# Patient Record
Sex: Female | Born: 2000 | Race: Black or African American | Hispanic: No | Marital: Single | Smoking: Never smoker
Health system: Southern US, Community
[De-identification: ages and names within clinical notes are randomized; demographics above are authoritative.]

## PROBLEM LIST (undated history)

## (undated) ENCOUNTER — Inpatient Hospital Stay: Payer: Self-pay

## (undated) ENCOUNTER — Inpatient Hospital Stay (HOSPITAL_COMMUNITY): Payer: Self-pay

## (undated) ENCOUNTER — Emergency Department (HOSPITAL_COMMUNITY): Payer: Medicaid Other

## (undated) DIAGNOSIS — E119 Type 2 diabetes mellitus without complications: Secondary | ICD-10-CM

## (undated) DIAGNOSIS — F32A Depression, unspecified: Secondary | ICD-10-CM

## (undated) DIAGNOSIS — F909 Attention-deficit hyperactivity disorder, unspecified type: Secondary | ICD-10-CM

## (undated) DIAGNOSIS — L309 Dermatitis, unspecified: Secondary | ICD-10-CM

## (undated) DIAGNOSIS — F419 Anxiety disorder, unspecified: Secondary | ICD-10-CM

## (undated) DIAGNOSIS — F329 Major depressive disorder, single episode, unspecified: Secondary | ICD-10-CM

## (undated) HISTORY — PX: NO PAST SURGERIES: SHX2092

## (undated) HISTORY — DX: Depression, unspecified: F32.A

## (undated) HISTORY — DX: Dermatitis, unspecified: L30.9

## (undated) HISTORY — PX: UPPER GASTROINTESTINAL ENDOSCOPY: SHX188

## (undated) HISTORY — DX: Anxiety disorder, unspecified: F41.9

## (undated) HISTORY — DX: Major depressive disorder, single episode, unspecified: F32.9

---
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
## 2019-01-23 ENCOUNTER — Encounter: Payer: Medicaid Other | Admitting: Certified Nurse Midwife

## 2019-02-17 ENCOUNTER — Ambulatory Visit (INDEPENDENT_AMBULATORY_CARE_PROVIDER_SITE_OTHER): Payer: Medicaid Other | Admitting: Certified Nurse Midwife

## 2019-02-17 ENCOUNTER — Other Ambulatory Visit: Payer: Self-pay

## 2019-02-17 ENCOUNTER — Encounter: Payer: Self-pay | Admitting: Certified Nurse Midwife

## 2019-02-17 ENCOUNTER — Emergency Department
Admission: EM | Admit: 2019-02-17 | Discharge: 2019-02-17 | Disposition: A | Discharge status: Home / Self Care | Payer: Medicaid Other | Attending: Emergency Medicine | Admitting: Emergency Medicine

## 2019-02-17 VITALS — BP 88/55 | HR 106 | Ht 60.0 in | Wt 219.4 lb

## 2019-02-17 DIAGNOSIS — Z3201 Encounter for pregnancy test, result positive: Secondary | ICD-10-CM

## 2019-02-17 DIAGNOSIS — Z3A00 Weeks of gestation of pregnancy not specified: Secondary | ICD-10-CM | POA: Insufficient documentation

## 2019-02-17 DIAGNOSIS — Z5321 Procedure and treatment not carried out due to patient leaving prior to being seen by health care provider: Secondary | ICD-10-CM | POA: Insufficient documentation

## 2019-02-17 DIAGNOSIS — N926 Irregular menstruation, unspecified: Secondary | ICD-10-CM

## 2019-02-17 LAB — POCT URINE PREGNANCY: Preg Test, Ur: POSITIVE — AB

## 2019-02-17 MED ORDER — DOXYLAMINE-PYRIDOXINE 10-10 MG PO TBEC: 1.0000 | DELAYED_RELEASE_TABLET | Freq: Four times a day (QID) | ORAL | 5 refills | Status: DC

## 2019-02-17 NOTE — ED Notes (Signed)
Unable to locate patient to perform lab draw at this time.

## 2019-02-17 NOTE — Progress Notes (Signed)
Subjective:    Miranda Curtis is a 18 y.o. female who presents for evaluation of amenorrhea. She believes she could be pregnant. Pregnancy is desired. Sexual Activity: single partner, contraception: none. Current symptoms also include: nausea. Last period was normal.   Patient's last menstrual period was 01/11/2019 (exact date). The following portions of the patient's history were reviewed and updated as appropriate: allergies, current medications, past family history, past medical history, past social history, past surgical history and problem list.  Review of Systems Pertinent items are noted in HPI.     Objective:    BP (!) 88/55   Pulse (!) 106   Ht 5' (1.524 m)   Wt 219 lb 6 oz (99.5 kg)   LMP 01/11/2019 (Exact Date)   Breastfeeding No   BMI 42.84 kg/m  General: alert, cooperative, appears stated age, no distress and no acute distress    Lab Review Urine HCG: positive    Assessment:    Absence of menstruation.     Plan:  Positive: EDC: 10/18/19. Briefly discussed pre-natal care options. MD or Midwifery care. Plans midwives. Encouraged well-balanced diet, plenty of rest when needed, pre-natal vitamins daily and walking for exercise. Discussed self-help for nausea, avoiding OTC medications until consulting provider or pharmacist, other than Tylenol as needed, minimal caffeine (1-2 cups daily) and avoiding alcohol. She will schedule u/s for dating in 2 wks, her nurse visit in 5 wks and  her initial NOB visit in 7 wks. Order for Diclegis placed.   Philip Aspen, CNM

## 2019-02-17 NOTE — Patient Instructions (Signed)

## 2019-02-17 NOTE — ED Notes (Signed)
No answer when called to draw blood.
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
## 2019-02-17 NOTE — ED Notes (Signed)
Patient's mother answered when patient's phone was called. Patient left hospital without notifying staff.

## 2019-02-18 ENCOUNTER — Telehealth: Payer: Self-pay | Admitting: Emergency Medicine

## 2019-02-18 NOTE — Telephone Encounter (Signed)
Called patient due to lwot to inquire about condition and follow up plans. Left message.   

## 2019-02-23 ENCOUNTER — Telehealth: Payer: Self-pay

## 2019-02-23 NOTE — Telephone Encounter (Signed)
ED two days ago HCG levels 2951 per pt. Asymptomatic per pt- no breast tenderness nausea or other symptoms any longer. Pt concerned.  [phone].

## 2019-03-03 ENCOUNTER — Other Ambulatory Visit: Payer: Self-pay

## 2019-03-03 ENCOUNTER — Ambulatory Visit (INDEPENDENT_AMBULATORY_CARE_PROVIDER_SITE_OTHER): Payer: Medicaid Other

## 2019-03-03 DIAGNOSIS — N926 Irregular menstruation, unspecified: Secondary | ICD-10-CM

## 2019-03-03 DIAGNOSIS — O3481 Maternal care for other abnormalities of pelvic organs, first trimester: Secondary | ICD-10-CM

## 2019-03-03 DIAGNOSIS — N8312 Corpus luteum cyst of left ovary: Secondary | ICD-10-CM

## 2019-03-03 DIAGNOSIS — Z3A01 Less than 8 weeks gestation of pregnancy: Secondary | ICD-10-CM | POA: Diagnosis not present

## 2019-03-04 ENCOUNTER — Telehealth: Payer: Self-pay

## 2019-03-10 ENCOUNTER — Telehealth: Payer: Self-pay

## 2019-03-10 MED ORDER — ONDANSETRON 4 MG PO TBDP: 4.0000 mg | ORAL_TABLET | Freq: Four times a day (QID) | ORAL | 0 refills | Status: DC | PRN

## 2019-03-10 NOTE — Telephone Encounter (Signed)
Script for zofran sent to preferred pharmacy. Mychart message sent to patient.

## 2019-03-23 ENCOUNTER — Telehealth: Payer: Self-pay

## 2019-03-23 ENCOUNTER — Other Ambulatory Visit: Payer: Self-pay | Admitting: Certified Nurse Midwife

## 2019-03-23 MED ORDER — PROMETHAZINE HCL 12.5 MG PO TABS: 12.5000 mg | ORAL_TABLET | Freq: Four times a day (QID) | ORAL | 0 refills | Status: DC | PRN

## 2019-03-23 NOTE — Telephone Encounter (Signed)
Called pt to prescreen appt 03/25/19. Pt states she has a cold for two days. No fever. No cough or shortness of breath. No contact with positive covid person. Pt states child has a cold as well. Please advise if pt can keep NOB Intake appt.

## 2019-03-23 NOTE — Progress Notes (Signed)
Pt continues to have N&V despite use of diclegis and zofran. Orders placed for phenergan.   Philip Aspen, CNM

## 2019-03-23 NOTE — Telephone Encounter (Signed)
Yes.  Pls reminder her to keep her mask on at all times. She will need to sanitize her hands in and out of the office.  If she develops fever, cough, or shortness of breath before her visit she is to call us. Thanks.

## 2019-03-23 NOTE — Telephone Encounter (Signed)
CMA spoke to pt. No additional follow up needed.

## 2019-03-25 ENCOUNTER — Other Ambulatory Visit: Payer: Self-pay

## 2019-03-25 ENCOUNTER — Ambulatory Visit (INDEPENDENT_AMBULATORY_CARE_PROVIDER_SITE_OTHER): Payer: Medicaid Other | Admitting: Certified Nurse Midwife

## 2019-03-25 VITALS — BP 107/74 | HR 93 | Ht 60.0 in | Wt 212.6 lb

## 2019-03-25 DIAGNOSIS — Z3491 Encounter for supervision of normal pregnancy, unspecified, first trimester: Secondary | ICD-10-CM

## 2019-03-25 DIAGNOSIS — Z113 Encounter for screening for infections with a predominantly sexual mode of transmission: Secondary | ICD-10-CM

## 2019-03-25 DIAGNOSIS — Z1329 Encounter for screening for other suspected endocrine disorder: Secondary | ICD-10-CM

## 2019-03-25 DIAGNOSIS — Z131 Encounter for screening for diabetes mellitus: Secondary | ICD-10-CM

## 2019-03-25 DIAGNOSIS — Z6841 Body Mass Index (BMI) 40.0 and over, adult: Secondary | ICD-10-CM

## 2019-03-25 DIAGNOSIS — Z0283 Encounter for blood-alcohol and blood-drug test: Secondary | ICD-10-CM

## 2019-03-25 NOTE — Patient Instructions (Signed)
First Trimester of Pregnancy °The first trimester of pregnancy is from week 1 until the end of week 13 (months 1 through 3). A week after a sperm fertilizes an egg, the egg will implant on the wall of the uterus. This embryo will begin to develop into a baby. Genes from you and your partner will form the baby. The female genes will determine whether the baby will be a boy or a girl. At 6-8 weeks, the eyes and face will be formed, and the heartbeat can be seen on ultrasound. At the end of 12 weeks, all the baby's organs will be formed. °Now that you are pregnant, you will want to do everything you can to have a healthy baby. Two of the most important things are to get good prenatal care and to follow your health care provider's instructions. Prenatal care is all the medical care you receive before the baby's birth. This care will help prevent, find, and treat any problems during the pregnancy and childbirth. °Body changes during your first trimester °Your body goes through many changes during pregnancy. The changes vary from woman to woman. °· You may gain or lose a couple of pounds at first. °· You may feel sick to your stomach (nauseous) and you may throw up (vomit). If the vomiting is uncontrollable, call your health care provider. °· You may tire easily. °· You may develop headaches that can be relieved by medicines. All medicines should be approved by your health care provider. °· You may urinate more often. Painful urination may mean you have a bladder infection. °· You may develop heartburn as a result of your pregnancy. °· You may develop constipation because certain hormones are causing the muscles that push stool through your intestines to slow down. °· You may develop hemorrhoids or swollen veins (varicose veins). °· Your breasts may begin to grow larger and become tender. Your nipples may stick out more, and the tissue that surrounds them (areola) may become darker. °· Your gums may bleed and may be  sensitive to brushing and flossing. °· Dark spots or blotches (chloasma, mask of pregnancy) may develop on your face. This will likely fade after the baby is born. °· Your menstrual periods will stop. °· You may have a loss of appetite. °· You may develop cravings for certain kinds of food. °· You may have changes in your emotions from day to day, such as being excited to be pregnant or being concerned that something may go wrong with the pregnancy and baby. °· You may have more vivid and strange dreams. °· You may have changes in your hair. These can include thickening of your hair, rapid growth, and changes in texture. Some women also have hair loss during or after pregnancy, or hair that feels dry or thin. Your hair will most likely return to normal after your baby is born. °What to expect at prenatal visits °During a routine prenatal visit: °· You will be weighed to make sure you and the baby are growing normally. °· Your blood pressure will be taken. °· Your abdomen will be measured to track your baby's growth. °· The fetal heartbeat will be listened to between weeks 10 and 14 of your pregnancy. °· Test results from any previous visits will be discussed. °Your health care provider may ask you: °· How you are feeling. °· If you are feeling the baby move. °· If you have had any abnormal symptoms, such as leaking fluid, bleeding, severe headaches, or abdominal   cramping. °· If you are using any tobacco products, including cigarettes, chewing tobacco, and electronic cigarettes. °· If you have any questions. °Other tests that may be performed during your first trimester include: °· Blood tests to find your blood type and to check for the presence of any previous infections. The tests will also be used to check for low iron levels (anemia) and protein on red blood cells (Rh antibodies). Depending on your risk factors, or if you previously had diabetes during pregnancy, you may have tests to check for high blood sugar  that affects pregnant women (gestational diabetes). °· Urine tests to check for infections, diabetes, or protein in the urine. °· An ultrasound to confirm the proper growth and development of the baby. °· Fetal screens for spinal cord problems (spina bifida) and Down syndrome. °· HIV (human immunodeficiency virus) testing. Routine prenatal testing includes screening for HIV, unless you choose not to have this test. °· You may need other tests to make sure you and the baby are doing well. °Follow these instructions at home: °Medicines °· Follow your health care provider's instructions regarding medicine use. Specific medicines may be either safe or unsafe to take during pregnancy. °· Take a prenatal vitamin that contains at least 600 micrograms (mcg) of folic acid. °· If you develop constipation, try taking a stool softener if your health care provider approves. °Eating and drinking ° °· Eat a balanced diet that includes fresh fruits and vegetables, whole grains, good sources of protein such as meat, eggs, or tofu, and low-fat dairy. Your health care provider will help you determine the amount of weight gain that is right for you. °· Avoid raw meat and uncooked cheese. These carry germs that can cause birth defects in the baby. °· Eating four or five small meals rather than three large meals a day may help relieve nausea and vomiting. If you start to feel nauseous, eating a few soda crackers can be helpful. Drinking liquids between meals, instead of during meals, also seems to help ease nausea and vomiting. °· Limit foods that are high in fat and processed sugars, such as fried and sweet foods. °· To prevent constipation: °? Eat foods that are high in fiber, such as fresh fruits and vegetables, whole grains, and beans. °? Drink enough fluid to keep your urine clear or pale yellow. °Activity °· Exercise only as directed by your health care provider. Most women can continue their usual exercise routine during  pregnancy. Try to exercise for 30 minutes at least 5 days a week. Exercising will help you: °? Control your weight. °? Stay in shape. °? Be prepared for labor and delivery. °· Experiencing pain or cramping in the lower abdomen or lower back is a good sign that you should stop exercising. Check with your health care provider before continuing with normal exercises. °· Try to avoid standing for long periods of time. Move your legs often if you must stand in one place for a long time. °· Avoid heavy lifting. °· Wear low-heeled shoes and practice good posture. °· You may continue to have sex unless your health care provider tells you not to. °Relieving pain and discomfort °· Wear a good support bra to relieve breast tenderness. °· Take warm sitz baths to soothe any pain or discomfort caused by hemorrhoids. Use hemorrhoid cream if your health care provider approves. °· Rest with your legs elevated if you have leg cramps or low back pain. °· If you develop varicose veins in   your legs, wear support hose. Elevate your feet for 15 minutes, 3-4 times a day. Limit salt in your diet. °Prenatal care °· Schedule your prenatal visits by the twelfth week of pregnancy. They are usually scheduled monthly at first, then more often in the last 2 months before delivery. °· Write down your questions. Take them to your prenatal visits. °· Keep all your prenatal visits as told by your health care provider. This is important. °Safety °· Wear your seat belt at all times when driving. °· Make a list of emergency phone numbers, including numbers for family, friends, the hospital, and police and fire departments. °General instructions °· Ask your health care provider for a referral to a local prenatal education class. Begin classes no later than the beginning of month 6 of your pregnancy. °· Ask for help if you have counseling or nutritional needs during pregnancy. Your health care provider can offer advice or refer you to specialists for help  with various needs. °· Do not use hot tubs, steam rooms, or saunas. °· Do not douche or use tampons or scented sanitary pads. °· Do not cross your legs for long periods of time. °· Avoid cat litter boxes and soil used by cats. These carry germs that can cause birth defects in the baby and possibly loss of the fetus by miscarriage or stillbirth. °· Avoid all smoking, herbs, alcohol, and medicines not prescribed by your health care provider. Chemicals in these products affect the formation and growth of the baby. °· Do not use any products that contain nicotine or tobacco, such as cigarettes and e-cigarettes. If you need help quitting, ask your health care provider. You may receive counseling support and other resources to help you quit. °· Schedule a dentist appointment. At home, brush your teeth with a soft toothbrush and be gentle when you floss. °Contact a health care provider if: °· You have dizziness. °· You have mild pelvic cramps, pelvic pressure, or nagging pain in the abdominal area. °· You have persistent nausea, vomiting, or diarrhea. °· You have a bad smelling vaginal discharge. °· You have pain when you urinate. °· You notice increased swelling in your face, hands, legs, or ankles. °· You are exposed to fifth disease or chickenpox. °· You are exposed to German measles (rubella) and have never had it. °Get help right away if: °· You have a fever. °· You are leaking fluid from your vagina. °· You have spotting or bleeding from your vagina. °· You have severe abdominal cramping or pain. °· You have rapid weight gain or loss. °· You vomit blood or material that looks like coffee grounds. °· You develop a severe headache. °· You have shortness of breath. °· You have any kind of trauma, such as from a fall or a car accident. °Summary °· The first trimester of pregnancy is from week 1 until the end of week 13 (months 1 through 3). °· Your body goes through many changes during pregnancy. The changes vary from  woman to woman. °· You will have routine prenatal visits. During those visits, your health care provider will examine you, discuss any test results you may have, and talk with you about how you are feeling. °This information is not intended to replace advice given to you by your health care provider. Make sure you discuss any questions you have with your health care provider. °Document Released: 03/20/2001 Document Revised: 03/08/2017 Document Reviewed: 03/07/2016 °Elsevier Patient Education © 2020 [employer] ° °Commonly Asked   Questions During Pregnancy ° °Cats: A parasite can be excreted in cat feces.  To avoid exposure you need to have another person empty the little box.  If you must empty the litter box you will need to wear gloves.  Wash your hands after handling your cat.  This parasite can also be found in raw or undercooked meat so this should also be avoided. ° °Colds, Sore Throats, Flu: Please check your medication sheet to see what you can take for symptoms.  If your symptoms are unrelieved by these medications please call the office. ° °Dental Work: Most any dental work your dentist recommends is permitted.  X-rays should only be taken during the first trimester if absolutely necessary.  Your abdomen should be shielded with a lead apron during all x-rays.  Please notify your provider prior to receiving any x-rays.  Novocaine is fine; gas is not recommended.  If your dentist requires a note from us prior to dental work please call the office and we will provide one for you. ° °Exercise: Exercise is an important part of staying healthy during your pregnancy.  You may continue most exercises you were accustomed to prior to pregnancy.  Later in your pregnancy you will most likely notice you have difficulty with activities requiring balance like riding a bicycle.  It is important that you listen to your body and avoid activities that put you at a higher risk of falling.  Adequate rest and staying well  hydrated are a must!  If you have questions about the safety of specific activities ask your provider.   ° °Exposure to Children with illness: Try to avoid obvious exposure; report any symptoms to us when noted,  If you have chicken pos, red measles or mumps, you should be immune to these diseases.   Please do not take any vaccines while pregnant unless you have checked with your OB provider. ° °Fetal Movement: After 28 weeks we recommend you do "kick counts" twice daily.  Lie or sit down in a calm quiet environment and count your baby movements "kicks".  You should feel your baby at least 10 times per hour.  If you have not felt 10 kicks within the first hour get up, walk around and have something sweet to eat or drink then repeat for an additional hour.  If count remains less than 10 per hour notify your provider. ° °Fumigating: Follow your pest control agent's advice as to how long to stay out of your home.  Ventilate the area well before re-entering. ° °Hemorrhoids:   Most over-the-counter preparations can be used during pregnancy.  Check your medication to see what is safe to use.  It is important to use a stool softener or fiber in your diet and to drink lots of liquids.  If hemorrhoids seem to be getting worse please call the office.  ° °Hot Tubs:  Hot tubs Jacuzzis and saunas are not recommended while pregnant.  These increase your internal body temperature and should be avoided. ° °Intercourse:  Sexual intercourse is safe during pregnancy as long as you are comfortable, unless otherwise advised by your provider.  Spotting may occur after intercourse; report any bright red bleeding that is heavier than spotting. ° °Labor:  If you know that you are in labor, please go to the hospital.  If you are unsure, please call the office and let us help you decide what to do. ° °Lifting, straining, etc:  If your job requires heavy lifting or   straining please check with your provider for any limitations.  Generally, you  should not lift items heavier than that you can lift simply with your hands and arms (no back muscles) ° °Painting:  Paint fumes do not harm your pregnancy, but may make you ill and should be avoided if possible.  Latex or water based paints have less odor than oils.  Use adequate ventilation while painting. ° °Permanents & Hair Color:  Chemicals in hair dyes are not recommended as they cause increase hair dryness which can increase hair loss during pregnancy.  " Highlighting" and permanents are allowed.  Dye may be absorbed differently and permanents may not hold as well during pregnancy. ° °Sunbathing:  Use a sunscreen, as skin burns easily during pregnancy.  Drink plenty of fluids; avoid over heating. ° °Tanning Beds:  Because their possible side effects are still unknown, tanning beds are not recommended. ° °Ultrasound Scans:  Routine ultrasounds are performed at approximately 20 weeks.  You will be able to see your baby's general anatomy an if you would like to know the gender this can usually be determined as well.  If it is questionable when you conceived you may also receive an ultrasound early in your pregnancy for dating purposes.  Otherwise ultrasound exams are not routinely performed unless there is a medical necessity.  Although you can request a scan we ask that you pay for it when conducted because insurance does not cover " patient request" scans. ° °Work: If your pregnancy proceeds without complications you may work until your due date, unless your physician or employer advises otherwise. ° °Round Ligament Pain/Pelvic Discomfort:  Sharp, shooting pains not associated with bleeding are fairly common, usually occurring in the second trimester of pregnancy.  They tend to be worse when standing up or when you remain standing for long periods of time.  These are the result of pressure of certain pelvic ligaments called "round ligaments".  Rest, Tylenol and heat seem to be the most effective relief.  As  the womb and fetus grow, they rise out of the pelvis and the discomfort improves.  Please notify the office if your pain seems different than that described.  It may represent a more serious condition. ° ° °How a Baby Grows During Pregnancy ° °Pregnancy begins when a female's sperm enters a female's egg (fertilization). Fertilization usually happens in one of the tubes (fallopian tubes) that connect the ovaries to the womb (uterus). The fertilized egg moves down the fallopian tube to the uterus. Once it reaches the uterus, it implants into the lining of the uterus and begins to grow. °For the first 10 weeks, the fertilized egg is called an embryo. After 10 weeks, it is called a fetus. As the fetus continues to grow, it receives oxygen and nutrients through tissue (placenta) that grows to support the developing baby. The placenta is the life support system for the baby. It provides oxygen and nutrition and removes waste. °Learning as much as you can about your pregnancy and how your baby is developing can help you enjoy the experience. It can also make you aware of when there might be a problem and when to ask questions. °How long does a typical pregnancy last? °A pregnancy usually lasts 280 days, or about 40 weeks. Pregnancy is divided into three periods of growth, also called trimesters: °· First trimester: 0-12 weeks. °· Second trimester: 13-27 weeks. °· Third trimester: 28-40 weeks. °The day when your baby is ready to be   born (full term) is your estimated date of delivery. °How does my baby develop month by month? °First month °· The fertilized egg attaches to the inside of the uterus. °· Some cells will form the placenta. Others will form the fetus. °· The arms, legs, brain, spinal cord, lungs, and heart begin to develop. °· At the end of the first month, the heart begins to beat. °Second month °· The bones, inner ear, eyelids, hands, and feet form. °· The genitals develop. °· By the end of 8 weeks, all major  organs are developing. °Third month °· All of the internal organs are forming. °· Teeth develop below the gums. °· Bones and muscles begin to grow. The spine can flex. °· The skin is transparent. °· Fingernails and toenails begin to form. °· Arms and legs continue to grow longer, and hands and feet develop. °· The fetus is about 3 inches (7.6 cm) long. °Fourth month °· The placenta is completely formed. °· The external sex organs, neck, outer ear, eyebrows, eyelids, and fingernails are formed. °· The fetus can hear, swallow, and move its arms and legs. °· The kidneys begin to produce urine. °· The skin is covered with a white, waxy coating (vernix) and very fine hair (lanugo). °Fifth month °· The fetus moves around more and can be felt for the first time (quickening). °· The fetus starts to sleep and wake up and may begin to suck its finger. °· The nails grow to the end of the fingers. °· The organ in the digestive system that makes bile (gallbladder) functions and helps to digest nutrients. °· If your baby is a girl, eggs are present in her ovaries. If your baby is a boy, testicles start to move down into his scrotum. °Sixth month °· The lungs are formed. °· The eyes open. The brain continues to develop. °· Your baby has fingerprints and toe prints. Your baby's hair grows thicker. °· At the end of the second trimester, the fetus is about 9 inches (22.9 cm) long. °Seventh month °· The fetus kicks and stretches. °· The eyes are developed enough to sense changes in light. °· The hands can make a grasping motion. °· The fetus responds to sound. °Eighth month °· All organs and body systems are fully developed and functioning. °· Bones harden, and taste buds develop. The fetus may hiccup. °· Certain areas of the brain are still developing. The skull remains soft. °Ninth month °· The fetus gains about ½ lb (0.23 kg) each week. °· The lungs are fully developed. °· Patterns of sleep develop. °· The fetus's head typically  moves into a head-down position (vertex) in the uterus to prepare for birth. °· The fetus weighs 6-9 lb (2.72-4.08 kg) and is 19-20 inches (48.26-50.8 cm) long. °What can I do to have a healthy pregnancy and help my baby develop? °General instructions °· Take prenatal vitamins as directed by your health care provider. These include vitamins such as folic acid, iron, calcium, and vitamin D. They are important for healthy development. °· Take medicines only as directed by your health care provider. Read labels and ask a pharmacist or your health care provider whether over-the-counter medicines, supplements, and prescription drugs are safe to take during pregnancy. °· Keep all follow-up visits as directed by your health care provider. This is important. Follow-up visits include prenatal care and screening tests. °How do I know if my baby is developing well? °At each prenatal visit, your health care provider will do   several different tests to check on your health and keep track of your baby's development. These include: °· Fundal height and position. °? Your health care provider will measure your growing belly from your pubic bone to the top of the uterus using a tape measure. °? Your health care provider will also feel your belly to determine your baby's position. °· Heartbeat. °? An ultrasound in the first trimester can confirm pregnancy and show a heartbeat, depending on how far along you are. °? Your health care provider will check your baby's heart rate at every prenatal visit. °· Second trimester ultrasound. °? This ultrasound checks your baby's development. It also may show your baby's gender. °What should I do if I have concerns about my baby's development? °Always talk with your health care provider about any concerns that you may have about your pregnancy and your baby. °Summary °· A pregnancy usually lasts 280 days, or about 40 weeks. Pregnancy is divided into three periods of growth, also called  trimesters. °· Your health care provider will monitor your baby's growth and development throughout your pregnancy. °· Follow your health care provider's recommendations about taking prenatal vitamins and medicines during your pregnancy. °· Talk with your health care provider if you have any concerns about your pregnancy or your developing baby. °This information is not intended to replace advice given to you by your health care provider. Make sure you discuss any questions you have with your health care provider. °Document Released: 09/12/2007 Document Revised: 07/17/2018 Document Reviewed: 02/06/2017 °Elsevier Patient Education © 2020 [employer] °  °

## 2019-03-25 NOTE — Progress Notes (Signed)
Levon Hedger presents for NOB nurse interview visit.  Pregnancy confirmation done at [facility] with [facility].  G-3.  P-1.  LMP 01/11/2019.  EDD 10/18/2019. Dating scan done 03/03/2019 [facility] [redacted]w[redacted]d.  Pregnancy education material explained and given.  No cats in the home.  NOB labs ordered.  TSH/HgA1C ordered due to increased BMI.  Body mass index is 41.52 kg/m.  Sickle cell lab ordered. HIV and drug screen were explained and ordered.  PNV encouraged.  Genetic screening options discussed.  Genetic testing: Unsure.  FMLA form signed and financial policy reviewed.    Patient getting over a cold.  Patient c/o continued N&V, unable to take PNV.  Patient request Zofran refill, states "Diclegis and Phenergan do not work".  NOB physical scheduled already with [facility] on 04/07/2019.

## 2019-03-26 LAB — URINALYSIS, ROUTINE W REFLEX MICROSCOPIC
Bilirubin, UA: NEGATIVE
Glucose, UA: NEGATIVE
Nitrite, UA: NEGATIVE
RBC, UA: NEGATIVE
Specific Gravity, UA: 1.027 (ref 1.005–1.030)
Urobilinogen, Ur: 1 mg/dL (ref 0.2–1.0)
pH, UA: 7.5 (ref 5.0–7.5)

## 2019-03-26 LAB — HIV ANTIBODY (ROUTINE TESTING W REFLEX): HIV Screen 4th Generation wRfx: NONREACTIVE

## 2019-03-26 LAB — MONITOR DRUG PROFILE 14(MW)
Amphetamine Scrn, Ur: NEGATIVE ng/mL
BARBITURATE SCREEN URINE: NEGATIVE ng/mL
BENZODIAZEPINE SCREEN, URINE: NEGATIVE ng/mL
Buprenorphine, Urine: NEGATIVE ng/mL
CANNABINOIDS UR QL SCN: NEGATIVE ng/mL
Cocaine (Metab) Scrn, Ur: NEGATIVE ng/mL
Creatinine(Crt), U: 287.1 mg/dL (ref 20.0–300.0)
Fentanyl, Urine: NEGATIVE pg/mL
Meperidine Screen, Urine: NEGATIVE ng/mL
Methadone Screen, Urine: NEGATIVE ng/mL
OXYCODONE+OXYMORPHONE UR QL SCN: NEGATIVE ng/mL
Opiate Scrn, Ur: NEGATIVE ng/mL
Ph of Urine: 7.7 (ref 4.5–8.9)
Phencyclidine Qn, Ur: NEGATIVE ng/mL
Propoxyphene Scrn, Ur: NEGATIVE ng/mL
SPECIFIC GRAVITY: 1.018
Tramadol Screen, Urine: NEGATIVE ng/mL

## 2019-03-26 LAB — ABO AND RH: Rh Factor: POSITIVE

## 2019-03-26 LAB — MICROSCOPIC EXAMINATION
Casts: NONE SEEN /lpf
Epithelial Cells (non renal): >10 /hpf — AB (ref 0–10)
RBC, Urine: NONE SEEN /hpf (ref 0–2)

## 2019-03-26 LAB — RUBELLA SCREEN: Rubella Antibodies, IGG: 1.41 index (ref >0.99)

## 2019-03-26 LAB — HEMOGLOBIN A1C
Est. average glucose Bld gHb Est-mCnc: 85 mg/dL
Hgb A1c MFr Bld: 4.6 % — ABNORMAL LOW (ref 4.8–5.6)

## 2019-03-26 LAB — GC/CHLAMYDIA PROBE AMP
Chlamydia trachomatis, NAA: NEGATIVE
Neisseria Gonorrhoeae by PCR: NEGATIVE

## 2019-03-26 LAB — ANTIBODY SCREEN: Antibody Screen: NEGATIVE

## 2019-03-26 LAB — NICOTINE SCREEN, URINE: Cotinine Ql Scrn, Ur: NEGATIVE ng/mL

## 2019-03-26 LAB — VARICELLA ZOSTER ANTIBODY, IGG: Varicella zoster IgG: <135 index — ABNORMAL LOW (ref >165)

## 2019-03-26 LAB — SICKLE CELL SCREEN: Sickle Cell Screen: NEGATIVE

## 2019-03-26 LAB — HEPATITIS B SURFACE ANTIGEN: Hepatitis B Surface Ag: NEGATIVE

## 2019-03-26 LAB — TSH: TSH: 0.529 u[IU]/mL (ref 0.450–4.500)

## 2019-03-26 LAB — RPR: RPR Ser Ql: NONREACTIVE

## 2019-03-27 LAB — URINE CULTURE, OB REFLEX

## 2019-03-27 LAB — CULTURE, OB URINE

## 2019-04-07 ENCOUNTER — Other Ambulatory Visit: Payer: Self-pay

## 2019-04-07 ENCOUNTER — Ambulatory Visit (INDEPENDENT_AMBULATORY_CARE_PROVIDER_SITE_OTHER): Payer: Medicaid Other | Admitting: Certified Nurse Midwife

## 2019-04-07 ENCOUNTER — Other Ambulatory Visit (INDEPENDENT_AMBULATORY_CARE_PROVIDER_SITE_OTHER): Payer: Medicaid Other

## 2019-04-07 ENCOUNTER — Encounter: Payer: Medicaid Other | Admitting: Obstetrics and Gynecology

## 2019-04-07 VITALS — BP 121/84 | HR 100 | Wt 212.2 lb

## 2019-04-07 DIAGNOSIS — Z3A08 8 weeks gestation of pregnancy: Secondary | ICD-10-CM

## 2019-04-07 DIAGNOSIS — N8312 Corpus luteum cyst of left ovary: Secondary | ICD-10-CM

## 2019-04-07 DIAGNOSIS — Z3491 Encounter for supervision of normal pregnancy, unspecified, first trimester: Secondary | ICD-10-CM

## 2019-04-07 DIAGNOSIS — O3481 Maternal care for other abnormalities of pelvic organs, first trimester: Secondary | ICD-10-CM | POA: Diagnosis not present

## 2019-04-07 DIAGNOSIS — Z3A12 12 weeks gestation of pregnancy: Secondary | ICD-10-CM | POA: Diagnosis not present

## 2019-04-07 DIAGNOSIS — O021 Missed abortion: Secondary | ICD-10-CM | POA: Insufficient documentation

## 2019-04-07 HISTORY — DX: Missed abortion: O02.1

## 2019-04-07 LAB — POCT URINALYSIS DIPSTICK OB
Bilirubin, UA: NEGATIVE
Blood, UA: NEGATIVE
Glucose, UA: NEGATIVE
Ketones, UA: NEGATIVE
Leukocytes, UA: NEGATIVE
Nitrite, UA: NEGATIVE
POC,PROTEIN,UA: NEGATIVE
Spec Grav, UA: 1.025 (ref 1.010–1.025)
Urobilinogen, UA: 0.2 E.U./dL
pH, UA: 5 (ref 5.0–8.0)

## 2019-04-07 NOTE — Progress Notes (Signed)
NEW OB HISTORY AND PHYSICAL  SUBJECTIVE:       Miranda Curtis is a 18 y.o. G33P1011 female, Patient's last menstrual period was 01/11/2019 (exact date)., Estimated Date of Delivery: 10/18/19, [redacted]w[redacted]d, presents today for establishment of Prenatal Care. She has no unusual complaints     Body mass index is 41.45 kg/m.  Gynecologic History Patient's last menstrual period was 01/11/2019 (exact date). Normal Contraception: none Last Pap: n/a .   Obstetric History OB History  Gravida Para Term Preterm AB Living  3 1 1   1 1   SAB TAB Ectopic Multiple Live Births  1     0 1    # Outcome Date GA Lbr Len/2nd Weight Sex Delivery Anes PTL Lv  3 Current           2 Term 11/08/17 [redacted]w[redacted]d 21:32 / 00:51 6 lb 11.9 oz (3.06 kg) F Vag-Spont EPI N LIV  1 SAB 10/07/16 [redacted]w[redacted]d           Past Medical History:  Diagnosis Date  . Anxiety   . Attention deficit hyperactivity disorder (ADHD)   . Depression   . Eczema     Past Surgical History:  Procedure Laterality Date  . NO PAST SURGERIES      Current Outpatient Medications on File Prior to Visit  Medication Sig Dispense Refill  . ondansetron (ZOFRAN ODT) 4 MG disintegrating tablet Take 1 tablet (4 mg total) by mouth every 6 (six) hours as needed for nausea. (Patient not taking: Reported on 04/07/2019) 20 tablet 0   No current facility-administered medications on file prior to visit.    Allergies  Allergen Reactions  . Metoclopramide Other (See Comments)    "Severe panic attack" "Severe panic attack" "Severe panic attack"    Social History   Socioeconomic History  . Marital status: Single    Spouse name: Not on file  . Number of children: Not on file  . Years of education: Not on file  . Highest education level: Not on file  Occupational History  . Occupation: 04/09/2019  Tobacco Use  . Smoking status: Never Smoker  . Smokeless tobacco: Never Used  Substance and Sexual Activity  . Alcohol use: No  . Drug use: No  . Sexual activity: Yes     Partners: Male    Birth control/protection: None  Other Topics Concern  . Not on file  Social History Narrative  . Not on file   Social Determinants of Health   Financial Resource Strain:   . Difficulty of Paying Living Expenses: Not on file  Food Insecurity:   . Worried About Consulting civil engineer in the Last Year: Not on file  . Ran Out of Food in the Last Year: Not on file  Transportation Needs:   . Lack of Transportation (Medical): Not on file  . Lack of Transportation (Non-Medical): Not on file  Physical Activity:   . Days of Exercise per Week: Not on file  . Minutes of Exercise per Session: Not on file  Stress:   . Feeling of Stress : Not on file  Social Connections:   . Frequency of Communication with Friends and Family: Not on file  . Frequency of Social Gatherings with Friends and Family: Not on file  . Attends Religious Services: Not on file  . Active Member of Clubs or Organizations: Not on file  . Attends Programme researcher, broadcasting/film/video Meetings: Not on file  . Marital Status: Not on file  Intimate Partner Violence:   .  Fear of Current or Ex-Partner: Not on file  . Emotionally Abused: Not on file  . Physically Abused: Not on file  . Sexually Abused: Not on file    Family History  Problem Relation Age of Onset  . Rashes / Skin problems Father        eczema  . Asthma Sister   . Rashes / Skin problems Sister        eczema  . Diabetes Maternal Grandmother   . Hypertension Maternal Grandmother   . Cancer Maternal Grandfather        prostate  . Ovarian cancer Neg Hx   . Colon cancer Neg Hx   . Breast cancer Neg Hx     The following portions of the patient's history were reviewed and updated as appropriate: allergies, current medications, past OB history, past medical history, past surgical history, past family history, past social history, and problem list.    OBJECTIVE: Initial Physical Exam (New OB)  GENERAL APPEARANCE: alert, well appearing, in no apparent  distress, oriented to person, place and time, overweight HEAD: normocephalic, atraumatic MOUTH: mucous membranes moist, pharynx normal without lesions THYROID: no thyromegaly or masses present BREASTS: no masses noted, no significant tenderness, no palpable axillary nodes, no skin changes LUNGS: clear to auscultation, no wheezes, rales or rhonchi, symmetric air entry HEART: regular rate and rhythm, no murmurs ABDOMEN: soft, nontender, nondistended, no abnormal masses, no epigastric pain, obese, fundus not palpable and FHT not heard EXTREMITIES: no redness or tenderness in the calves or thighs SKIN: normal coloration and turgor, no rashes LYMPH NODES: no adenopathy palpable NEUROLOGIC: alert, oriented, normal speech, no focal findings or movement disorder noted  PELVIC EXAM EXTERNAL GENITALIA: normal appearing vulva with no masses, tenderness or lesions VAGINA: no abnormal discharge or lesions CERVIX: no spec exam needed not done UTERUS: gravid ADNEXA: no masses palpable and nontender OB EXAM PELVIMETRY: appears adequate  Patient Name: Miranda Curtis DOB: [birth year] MRN: [id number]  ULTRASOUND REPORT  Location: [facility] Date of Service: 04/07/2019   Indications:dating Findings:  Nelda Marseille intrauterine pregnancy is visualized with a CRL consistent with [redacted]w[redacted]d gestation, giving an (U/S) EDD of 11/19/2019 . FHR: negitive  CRL measurement: 14 mm Yolk sac is visualized and appears normal and early anatomy is normal. Amnion: visualized and appears normal   Right Ovary is normal in appearance. Left Ovary is normal appearance. Corpus luteal cyst:  Left ovary Survey of the adnexa demonstrates no adnexal masses. There is no free peritoneal fluid in the cul de sac.  Impression: 1. [redacted]w[redacted]d Viable Singleton Intrauterine pregnancy by U/S. 2. (U/S) EDD is 11/19/2019 3. No fetal heart tones seen  Recommendations: 1.Clinical correlation with the patient's History and Physical  Exam.  Jenine M. Albertine Grates     RDMS  ASSESSMENT: Missed abortion    PLAN: FACTS YOU SHOULD KNOW  About Early Pregnancy Loss  WHAT IS AN EARLY PREGNANCY LOSS? Once the egg is fertilized with the sperm and begins to develop, it attaches to the lining of the uterus. This early pregnancy tissue may not develop into an embryo (the beginning stage of a baby). Sometimes an embryo does develop but does not continue to grow. These problems can be seen on ultrasound.   MANAGEMNT OF EARLY PREGNANCY LOSS: About 4 out of 100 (0.25%) women will have a pregnancy loss in her lifetime.  One in five pregnancies is found to be an early pregnancy loss.  There are 3 ways to care for an early pregnancy loss:   (  1) Surgery, (2) Medicine, (3) Waiting for you to pass the pregnancy on your own. The decision as to how to proceed after being diagnosed with and early pregnancy loss is an individual one.  The decision can be made only after appropriate counseling.  You need to weigh the pros and cons of the 3 choices. Then you can make the choice that works for you.  SURGERY (D&E) . Procedure over in 1 day . Requires being put to sleep . Bleeding may be light . Possible problems during surgery, including injury to womb(uterus) . Care provider has more control Medicine (CYTOTEC) . The complete procedure may take days to weeks . No Surgery . Bleeding may be heavy at times . There may be drug side effects . Patient has more control Waiting . You may choose to wait, in which case your own body may complete the passing of the abnormal early pregnancy on its own in about 2-4 weeks . Your bleeding may be heavy at times . There is a small possibility that you may need surgery if the bleeding is too much or not all of the pregnancy has passed.  CYTOTEC MANAGEMENT Prostaglandins (cytotec) are the most widely used drug for this purpose. They cause the uterus to cramp and contract. You will place the medicine yourself  inside your vagina in the privacy of your home. Empting of the uterus should occur within 3 days but the process may continue for several weeks. The bleeding may seem heavy at times.  INSTRUCTIONS: Take all 4 tablets of cytotec (800mcg total) at one time. This will cause a lot of cramping, you may have bleeding, and pass tissue, then the cramping and bleeding should get better. If you do not pass the tissue, then you can take 4 more tablets of cytotec (800mcg total) 48 hours after your first dose.  You will come back to have your blood drawn to make sure the pregnancy hormones are dropping in 1 week. Please call us if you have any questions.   POSSIBLE SIDE EFFECTS FROM CYTOTEC . Nausea  Vomiting . Diarrhea Fever . Chills  Hot Flashes Side effects  from the process of the early pregnancy loss include: . Cramping  Bleeding . Headaches  Dizziness RISKS: This is a low risk procedure. Less than 1 in 100 women has a complication. An incomplete passage of the early pregnancy may occur. Also, hemorrhage (heavy bleeding) could happen.  Rarely the pregnancy will not be passed completely. Excessively heavy bleeding may occur.  Your doctor may need to perform surgery to empty the uterus (D&E). Afterwards: Everybody will feel differently after the early pregnancy loss completion. You may have soreness or cramps for a day or two. You may have soreness or cramps for day or two.  You may have light bleeding for up to 2 weeks. You may be as active as you feel like being. If you have any of the following problems you may call [facility] at [phone] or [facility] at [phone] if it is after hours. . If you have pain that does not get better with pain medication . Bleeding that soaks through 2 thick full-sized sanitary pads in an hour . Cramps that last longer than 2 days . Foul smelling discharge . Fever above 100.4 degrees F Even if you do not have any of these symptoms, you should have a  follow-up exam to make sure you are healing properly. Your next normal period will usually start again in  4-6 week after the loss. You can get pregnant soon after the loss, so use birth control right away. Finally: Make sure all your questions are answered before during and after any procedure. Follow up with medical care and family planning methods.    Pt desires to wait for 1-2 wks to see if tissue passes. She will follow up with me if this does not occur to discuss if she would like to use cytotec for do D&E.   Doreene Burke, CNM

## 2019-04-07 NOTE — Patient Instructions (Signed)
Glucose Tolerance Test During Pregnancy Why am I having this test? The glucose tolerance test (GTT) is done to check how your body processes sugar (glucose). This is one of several tests used to diagnose diabetes that develops during pregnancy (gestational diabetes mellitus). Gestational diabetes is a temporary form of diabetes that some women develop during pregnancy. It usually occurs during the second trimester of pregnancy and goes away after delivery. Testing (screening) for gestational diabetes usually occurs between 24 and 28 weeks of pregnancy. You may have the GTT test after having a 1-hour glucose screening test if the results from that test indicate that you may have gestational diabetes. You may also have this test if:  You have a history of gestational diabetes.  You have a history of giving birth to very large babies or have experienced repeated fetal loss (stillbirth).  You have signs and symptoms of diabetes, such as: ? Changes in your vision. ? Tingling or numbness in your hands or feet. ? Changes in hunger, thirst, and urination that are not otherwise explained by your pregnancy. What is being tested? This test measures the amount of glucose in your blood at different times during a period of 3 hours. This indicates how well your body is able to process glucose. What kind of sample is taken?  Blood samples are required for this test. They are usually collected by inserting a needle into a blood vessel. How do I prepare for this test?  For 3 days before your test, eat normally. Have plenty of carbohydrate-rich foods.  Follow instructions from your health care provider about: ? Eating or drinking restrictions on the day of the test. You may be asked to not eat or drink anything other than water (fast) starting 8-10 hours before the test. ? Changing or stopping your regular medicines. Some medicines may interfere with this test. Tell a health care provider about:  All  medicines you are taking, including vitamins, herbs, eye drops, creams, and over-the-counter medicines.  Any blood disorders you have.  Any surgeries you have had.  Any medical conditions you have. What happens during the test? First, your blood glucose will be measured. This is referred to as your fasting blood glucose, since you fasted before the test. Then, you will drink a glucose solution that contains a certain amount of glucose. Your blood glucose will be measured again 1, 2, and 3 hours after drinking the solution. This test takes about 3 hours to complete. You will need to stay at the testing location during this time. During the testing period:  Do not eat or drink anything other than the glucose solution.  Do not exercise.  Do not use any products that contain nicotine or tobacco, such as cigarettes and e-cigarettes. If you need help stopping, ask your health care provider. The testing procedure may vary among health care providers and hospitals. How are the results reported? Your results will be reported as milligrams of glucose per deciliter of blood (mg/dL) or millimoles per liter (mmol/L). Your health care provider will compare your results to normal ranges that were established after testing a large group of people (reference ranges). Reference ranges may vary among labs and hospitals. For this test, common reference ranges are:  Fasting: less than 95-105 mg/dL (5.3-5.8 mmol/L).  1 hour after drinking glucose: less than 180-190 mg/dL (10.0-10.5 mmol/L).  2 hours after drinking glucose: less than 155-165 mg/dL (8.6-9.2 mmol/L).  3 hours after drinking glucose: 140-145 mg/dL (7.8-8.1 mmol/L). What do the   results mean? Results within reference ranges are considered normal, meaning that your glucose levels are well-controlled. If two or more of your blood glucose levels are high, you may be diagnosed with gestational diabetes. If only one level is high, your health care  provider may suggest repeat testing or other tests to confirm a diagnosis. Talk with your health care provider about what your results mean. Questions to ask your health care provider Ask your health care provider, or the department that is doing the test:  When will my results be ready?  How will I get my results?  What are my treatment options?  What other tests do I need?  What are my next steps? Summary  The glucose tolerance test (GTT) is one of several tests used to diagnose diabetes that develops during pregnancy (gestational diabetes mellitus). Gestational diabetes is a temporary form of diabetes that some women develop during pregnancy.  You may have the GTT test after having a 1-hour glucose screening test if the results from that test indicate that you may have gestational diabetes. You may also have this test if you have any symptoms or risk factors for gestational diabetes.  Talk with your health care provider about what your results mean. This information is not intended to replace advice given to you by your health care provider. Make sure you discuss any questions you have with your health care provider. Document Released: 09/25/2011 Document Revised: 07/17/2018 Document Reviewed: 11/05/2016 Elsevier Patient Education  2020 [employer] Inc[employer]  

## 2019-04-08 MED ORDER — OXYCODONE-ACETAMINOPHEN 7.5-325 MG PO TABS: 1.0000 | ORAL_TABLET | Freq: Four times a day (QID) | ORAL | 0 refills | Status: DC | PRN

## 2019-04-08 MED ORDER — MISOPROSTOL 200 MCG PO TABS: 800.0000 ug | ORAL_TABLET | Freq: Once | ORAL | 1 refills | Status: DC

## 2019-04-08 NOTE — Addendum Note (Signed)
Addended by: Hildred Priest on: 04/08/2019 08:37 AM   Modules accepted: Orders

## 2019-04-08 NOTE — Progress Notes (Signed)
Pt called yesterday evening and stated she would like to use cytotec for missed abortion management. Orders placed.   Philip Aspen, CNM

## 2019-04-09 ENCOUNTER — Telehealth: Payer: Self-pay

## 2019-04-09 NOTE — Telephone Encounter (Signed)
Pt stated that she is having n/v. Pt stated she was confused of the reason why she was stilling feeling like she pregnant. Pt was informed that her body still has some pregnancy hormones and is causing her to have n/v.

## 2019-04-09 NOTE — Telephone Encounter (Signed)
Patient is going through a miscarriage. Begins pills tomo, per Deneise Lever. Says she is nauseated and still experiencing vomiting and all pregnancy symptoms. Would like to speak with a nurse asap

## 2019-04-15 ENCOUNTER — Telehealth: Payer: Self-pay

## 2019-04-15 ENCOUNTER — Telehealth: Payer: Self-pay | Admitting: Certified Nurse Midwife

## 2019-04-15 NOTE — Telephone Encounter (Signed)
[facility]. Pt had [facility]riage. [facility] did blood work and u/s no heart beat. Pt was giving pills but she is bleeding heavy.pt is requesting a call. Please advise

## 2019-04-15 NOTE — Telephone Encounter (Signed)
Pt mom called. Feels like daughter's isn't receiving the care needed during miscarriage. Would like the provider to call patient.

## 2019-04-15 NOTE — Telephone Encounter (Signed)
If she passed all of the tissue then the bleeding should slow down. Did they tell her that the u/s showed nothing in the uterus? If the bleeding does not slow down, she is saturating pad every 1-2 hrs or is passing large clots, feels light headed/ dizzy/SOB/ or heart racing she should go to ED.   Thanks  Pattricia Boss

## 2019-04-15 NOTE — Telephone Encounter (Signed)
mychart message sent to patient

## 2019-04-15 NOTE — Telephone Encounter (Signed)
Pt called. Advised her of the Annie's message. Patients says the sac was present on U/S as of last night. Bleeding slowed down, is experiencing headaches. Will go to the ER if symptoms worsen

## 2019-04-16 ENCOUNTER — Telehealth: Payer: Self-pay

## 2019-04-16 NOTE — Telephone Encounter (Signed)
Third attempt to contact patient- would she like to come in for an ultrasound. Patient states she had one at [facility] but we are unable to see images.

## 2019-04-16 NOTE — Telephone Encounter (Signed)
That sounds good. Also see if she took the second round of the cytotec.   Thanks,  Pattricia Boss

## 2019-04-16 NOTE — Telephone Encounter (Signed)
Call placed , no answer , left message.   Doreene Burke , CNM

## 2019-04-16 NOTE — Telephone Encounter (Signed)
Jasmine December,   I called [name] this morning, she did not answer. I left a message. Can you please reach out to her. I tried to look in her chart for results from [facility] I do not see an ultrasound. If you need to you can reach out to one of the doctors if you get a hold of her today. I am happy to talk with her but I do not want to give out my cell phone number .   Thanks,  Pattricia Boss

## 2020-01-01 ENCOUNTER — Encounter: Payer: Medicaid Other | Admitting: Certified Nurse Midwife

## 2020-02-24 ENCOUNTER — Telehealth: Payer: Self-pay

## 2020-02-24 NOTE — Telephone Encounter (Signed)
Called patient to inform her of Miranda Curtis response to her telephone message. Patient will call the office to schedule an appointment.

## 2020-02-24 NOTE — Telephone Encounter (Signed)
Can you please call the pt, I can not prescribe clomid for her until I have investigated if she is having regular periods, is this a new partner for her, how long she has been trying . Also she would need to have counseling on the medication as I have never given it to her before. So basically , I do not feel comfortable prescribing it for her until she is seen.   Thanks  Pattricia Boss

## 2020-02-24 NOTE — Telephone Encounter (Signed)
Patient called in stating that she was having trouble with getting pregnant and would like to know if a Rx could be sent in to help assist her conceive. Patient started a new job and is unable to come in office for a visit during this time.   Informed patient I would send a message to her provider to see if this was possible and that her providers nurse would be in touch with her.  Could you please advise?

## 2020-06-08 ENCOUNTER — Other Ambulatory Visit: Payer: Self-pay

## 2020-06-08 ENCOUNTER — Ambulatory Visit (INDEPENDENT_AMBULATORY_CARE_PROVIDER_SITE_OTHER): Payer: Medicaid Other | Admitting: Certified Nurse Midwife

## 2020-06-08 ENCOUNTER — Encounter: Payer: Self-pay | Admitting: Certified Nurse Midwife

## 2020-06-08 VITALS — BP 116/73 | HR 99 | Ht 60.0 in | Wt 236.7 lb

## 2020-06-08 DIAGNOSIS — N912 Amenorrhea, unspecified: Secondary | ICD-10-CM | POA: Diagnosis not present

## 2020-06-08 DIAGNOSIS — N970 Female infertility associated with anovulation: Secondary | ICD-10-CM | POA: Insufficient documentation

## 2020-06-08 HISTORY — DX: Female infertility associated with anovulation: N97.0

## 2020-06-08 LAB — POCT URINE PREGNANCY: Preg Test, Ur: NEGATIVE

## 2020-06-08 NOTE — Progress Notes (Signed)
GYN ENCOUNTER NOTE  Subjective:       Miranda Curtis is a 20 y.o. G1P1021 female is here for gynecologic evaluation of the following issues:  1. Amenorrhea , pt state she is 43 late for her menstrual cycle. She had been having regular periods prior to that. She states that she had covid 2 weeks prior to being due to her cycle. State she has had breast tenderness and cramping like she is going to start but has not yet.    Gynecologic History Patient's last menstrual period was 03/28/2020 (exact date). Contraception: none, has been trying to conceive  Last Pap: n/a  Last mammogram: n/a   Obstetric History OB History  Gravida Para Term Preterm AB Living  3 1 1   2 1   SAB IAB Ectopic Multiple Live Births  2     0 1    # Outcome Date GA Lbr Len/2nd Weight Sex Delivery Anes PTL Lv  3 SAB 04/10/19 [redacted]w[redacted]d         2 Term 11/08/17 [redacted]w[redacted]d 21:32 / 00:51 6 lb 11.9 oz (3.06 kg) F Vag-Spont EPI N LIV  1 SAB 10/07/16 [redacted]w[redacted]d           Past Medical History:  Diagnosis Date  . Anxiety   . Attention deficit hyperactivity disorder (ADHD)   . Depression   . Eczema   . Hx miscarriage   Past Surgical History:  Procedure Laterality Date  . NO PAST SURGERIES      No current outpatient medications on file prior to visit.   No current facility-administered medications on file prior to visit.    Allergies  Allergen Reactions  . Metoclopramide Other (See Comments)    "Severe panic attack" "Severe panic attack" "Severe panic attack"    Social History   Socioeconomic History  . Marital status: Single    Spouse name: Not on file  . Number of children: Not on file  . Years of education: Not on file  . Highest education level: Not on file  Occupational History  . Occupation: [redacted]w[redacted]d  Tobacco Use  . Smoking status: Never Smoker  . Smokeless tobacco: Never Used  Vaping Use  . Vaping Use: Never used  Substance and Sexual Activity  . Alcohol use: No  . Drug use: No  . Sexual activity: Yes     Partners: Male    Birth control/protection: None  Other Topics Concern  . Not on file  Social History Narrative  . Not on file   Social Determinants of Health   Financial Resource Strain: Not on file  Food Insecurity: Not on file  Transportation Needs: Not on file  Physical Activity: Not on file  Stress: Not on file  Social Connections: Not on file  Intimate Partner Violence: Not on file    Family History  Problem Relation Age of Onset  . Rashes / Skin problems Father        eczema  . Asthma Sister   . Rashes / Skin problems Sister        eczema  . Diabetes Maternal Grandmother   . Hypertension Maternal Grandmother   . Cancer Maternal Grandfather        prostate  . Ovarian cancer Neg Hx   . Colon cancer Neg Hx   . Breast cancer Neg Hx     The following portions of the patient's history were reviewed and updated as appropriate: allergies, current medications, past family history, past medical history, past social history,  past surgical history and problem list.  Review of Systems Review of Systems - Negative except as mentioned in HPI Review of Systems - General ROS: negative for - chills, fatigue, fever, hot flashes, malaise or night sweats Hematological and Lymphatic ROS: negative for - bleeding problems or swollen lymph nodes Gastrointestinal ROS: negative for - abdominal pain, blood in stools, change in bowel habits and nausea/vomiting Musculoskeletal ROS: negative for - joint pain, muscle pain or muscular weakness Genito-Urinary ROS: negative for - change in menstrual cycle, dysmenorrhea, dyspareunia, dysuria, genital discharge, genital ulcers, hematuria, incontinence, irregular/heavy menses, nocturia or pelvic pain  Objective:   BP 116/73   Pulse 99   Ht 5' (1.524 m)   Wt 236 lb 11.2 oz (107.4 kg)   LMP 03/28/2020 (Exact Date)   BMI 46.23 kg/m  CONSTITUTIONAL: Well-developed, well-nourished, obese female in no acute distress.  HENT:  Normocephalic,  atraumatic.  NECK: Normal range of motion, supple, no masses.  Normal thyroid.  SKIN: Skin is warm and dry. No rash noted. Not diaphoretic. No erythema. No pallor. NEUROLGIC: Alert and oriented to person, place, and time. PSYCHIATRIC: Normal mood and affect. Normal behavior. Normal judgment and thought content. CARDIOVASCULAR:Not Examined RESPIRATORY: Not Examined BREASTS: Not Examined ABDOMEN: Soft, non distended; Non tender.  No Organomegaly. PELVIC:not indicated MUSCULOSKELETAL: Normal range of motion. No tenderness.  No cyanosis, clubbing, or edema.     Assessment:   1. Amenorrhea - POC Urinalysis Dipstick OB     Plan:   Discussed possible cause of amenorrhea, reviewed PCOS, orders placed for labs and u/s. Discussed if labs/ultrasound normal using provera to induce cycle. She verbalizes and agrees to plan. Will follow up with lab results. Follow up for u/s as scheduled.   Doreene Burke, CNM

## 2020-06-08 NOTE — Patient Instructions (Signed)
Polycystic Ovary Syndrome  Polycystic ovarian syndrome (PCOS) is a common hormonal disorder among women of reproductive age. In most women with PCOS, small fluid-filled sacs (cysts) grow on the ovaries. PCOS can cause problems with menstrual periods and make it hard to get and stay pregnant. If this condition is not treated, it can lead to serious health problems, such as diabetes and heart disease. What are the causes? The cause of this condition is not known. It may be due to certain factors, such as:  Irregular menstrual cycle.  High levels of certain hormones.  Problems with the hormone that helps to control blood sugar (insulin).  Certain genes. What increases the risk? You are more likely to develop this condition if you:  Have a family history of PCOS or type 2 diabetes.  Are overweight, eat unhealthy foods, and are not active. These factors may cause problems with blood sugar control, which can contribute to PCOS or PCOS symptoms. What are the signs or symptoms? Symptoms of this condition include:  Ovarian cysts and sometimes pelvic pain.  Menstrual periods that are not regular or are too heavy.  Inability to get or stay pregnant.  Increased growth of hair on the face, chest, stomach, back, thumbs, thighs, or toes.  Acne or oily skin. Acne may develop during adulthood, and it may not get better with treatment.  Weight gain or obesity.  Patches of thickened and dark brown or black skin on the neck, arms, breasts, or thighs. How is this diagnosed? This condition is diagnosed based on:  Your medical history.  A physical exam that includes a pelvic exam. Your health care provider may look for areas of increased hair growth on your skin.  Tests, such as: ? An ultrasound to check the ovaries for cysts and to view the lining of the uterus. ? Blood tests to check levels of sugar (glucose), female hormone (testosterone), and female hormones (estrogen and progesterone). How  is this treated? There is no cure for this condition, but treatment can help to manage symptoms and prevent more health problems from developing. Treatment varies depending on your symptoms and if you want to have a baby or if you need birth control. Treatment may include:  Making nutrition and lifestyle changes.  Taking the progesterone hormone to start a menstrual period.  Taking birth control pills to help you have regular menstrual periods.  Taking medicines such as: ? Medicines to make you ovulate, if you want to get pregnant. ? Medicine to reduce extra hair growth.  Having surgery in severe cases. This may involve making small holes in one or both of your ovaries. This decreases the amount of testosterone that your body makes. Follow these instructions at home:  Take over-the-counter and prescription medicines only as told by your health care provider.  Follow a healthy meal plan that includes lean proteins, complex carbohydrates, fresh fruits and vegetables, low-fat dairy products, healthy fats, and fiber.  If you are overweight, lose weight as told by your health care provider. Your health care provider can determine how much weight loss is best for you and can help you lose weight safely.  Keep all follow-up visits. This is important. Contact a health care provider if:  Your symptoms do not get better with medicine.  Your symptoms get worse or you develop new symptoms. Summary  Polycystic ovarian syndrome (PCOS) is a common hormonal disorder among women of reproductive age.  PCOS can cause problems with menstrual periods and make it hard   to get and stay pregnant.  If this condition is not treated, it can lead to serious health problems, such as diabetes and heart disease.  There is no cure for this condition, but treatment can help to manage symptoms and prevent more health problems from developing. This information is not intended to replace advice given to you by your  health care provider. Make sure you discuss any questions you have with your health care provider. Document Revised: 09/03/2019 Document Reviewed: 09/03/2019 Elsevier Patient Education  2021 [employer] Inc.  

## 2020-06-09 LAB — PROLACTIN: Prolactin: 34.2 ng/mL — ABNORMAL HIGH (ref 4.8–23.3)

## 2020-06-09 LAB — TESTOSTERONE: Testosterone: 43 ng/dL (ref 13–71)

## 2020-06-09 LAB — FSH/LH
FSH: 2.8 m[IU]/mL
LH: 6.9 m[IU]/mL

## 2020-06-09 LAB — BETA HCG QUANT (REF LAB): hCG Quant: <1 m[IU]/mL

## 2020-06-09 LAB — TSH: TSH: 1.96 u[IU]/mL (ref 0.450–4.500)

## 2020-06-13 ENCOUNTER — Other Ambulatory Visit: Payer: Self-pay | Admitting: Certified Nurse Midwife

## 2020-06-13 DIAGNOSIS — N912 Amenorrhea, unspecified: Secondary | ICD-10-CM

## 2020-06-15 ENCOUNTER — Ambulatory Visit: Payer: Medicaid Other | Attending: Certified Nurse Midwife

## 2020-07-14 ENCOUNTER — Ambulatory Visit: Payer: Medicaid Other | Attending: Certified Nurse Midwife

## 2021-08-08 ENCOUNTER — Encounter (HOSPITAL_COMMUNITY): Payer: Self-pay | Admitting: Emergency Medicine

## 2021-08-08 ENCOUNTER — Emergency Department (HOSPITAL_COMMUNITY)
Admission: EM | Admit: 2021-08-08 | Discharge: 2021-08-09 | Discharge status: Against Medical Advice | Payer: Medicaid Other | Attending: Emergency Medicine | Admitting: Emergency Medicine

## 2021-08-08 ENCOUNTER — Other Ambulatory Visit: Payer: Self-pay

## 2021-08-08 DIAGNOSIS — Z5321 Procedure and treatment not carried out due to patient leaving prior to being seen by health care provider: Secondary | ICD-10-CM | POA: Insufficient documentation

## 2021-08-08 DIAGNOSIS — E119 Type 2 diabetes mellitus without complications: Secondary | ICD-10-CM | POA: Diagnosis not present

## 2021-08-08 DIAGNOSIS — R197 Diarrhea, unspecified: Secondary | ICD-10-CM | POA: Diagnosis not present

## 2021-08-08 DIAGNOSIS — R6883 Chills (without fever): Secondary | ICD-10-CM | POA: Insufficient documentation

## 2021-08-08 DIAGNOSIS — R5383 Other fatigue: Secondary | ICD-10-CM | POA: Insufficient documentation

## 2021-08-08 DIAGNOSIS — R110 Nausea: Secondary | ICD-10-CM | POA: Insufficient documentation

## 2021-08-08 LAB — URINALYSIS, ROUTINE W REFLEX MICROSCOPIC
Bilirubin Urine: NEGATIVE
Glucose, UA: NEGATIVE mg/dL
Hgb urine dipstick: NEGATIVE
Ketones, ur: NEGATIVE mg/dL
Nitrite: NEGATIVE
Protein, ur: NEGATIVE mg/dL
Specific Gravity, Urine: 1.023 (ref 1.005–1.030)
pH: 6 (ref 5.0–8.0)

## 2021-08-08 LAB — COMPREHENSIVE METABOLIC PANEL
ALT: 20 U/L (ref 0–44)
AST: 21 U/L (ref 15–41)
Albumin: 4.2 g/dL (ref 3.5–5.0)
Alkaline Phosphatase: 41 U/L (ref 38–126)
Anion gap: 6 (ref 5–15)
BUN: 11 mg/dL (ref 6–20)
CO2: 23 mmol/L (ref 22–32)
Calcium: 9.3 mg/dL (ref 8.9–10.3)
Chloride: 107 mmol/L (ref 98–111)
Creatinine, Ser: 0.98 mg/dL (ref 0.44–1.00)
GFR, Estimated: >60 mL/min (ref >60)
Glucose, Bld: 93 mg/dL (ref 70–99)
Potassium: 4.1 mmol/L (ref 3.5–5.1)
Sodium: 136 mmol/L (ref 135–145)
Total Bilirubin: 0.8 mg/dL (ref 0.3–1.2)
Total Protein: 7.6 g/dL (ref 6.5–8.1)

## 2021-08-08 LAB — CBC
HCT: 43 % (ref 36.0–46.0)
Hemoglobin: 14.3 g/dL (ref 12.0–15.0)
MCH: 26 pg (ref 26.0–34.0)
MCHC: 33.3 g/dL (ref 30.0–36.0)
MCV: 78.3 fL — ABNORMAL LOW (ref 80.0–100.0)
Platelets: 358 10*3/uL (ref 150–400)
RBC: 5.49 MIL/uL — ABNORMAL HIGH (ref 3.87–5.11)
RDW: 14.7 % (ref 11.5–15.5)
WBC: 11.1 10*3/uL — ABNORMAL HIGH (ref 4.0–10.5)
nRBC: 0 % (ref 0.0–0.2)

## 2021-08-08 LAB — I-STAT BETA HCG BLOOD, ED (MC, WL, AP ONLY): I-stat hCG, quantitative: <5 m[IU]/mL (ref <5)

## 2021-08-08 LAB — LIPASE, BLOOD: Lipase: 36 U/L (ref 11–51)

## 2021-08-08 NOTE — ED Provider Triage Note (Signed)
Emergency Medicine Provider Triage Evaluation Note ? ?Miranda Curtis , a 21 y.o. female  was evaluated in triage.  Pt complains of nausea and diarrhea without vomiting over the last 2 days.  Endorses fatigue and chills, but denies fever.  Denies urinary symptoms, abdominal pain, chest pain, discomfort.  Denies recent contact with people with similar symptoms.  Hx of depression, anxiety, ADHD.  LMP 1 month ago.  Denies vaginal discharge or bleeding.  Hx noninsulin-dependent diabetes, not on any medical therapy for it.. ? ?Review of Systems  ?Positive: As above ?Negative: As above ? ?Physical Exam  ?BP 127/85 (BP Location: Left Arm)   Pulse 92   Temp 99.5 ?F (37.5 ?C) (Oral)   Resp 16   SpO2 97%  ?Gen:   Awake, no distress   ?Resp:  Normal effort CTAB ?MSK:   Moves extremities without difficulty  ?Other:  RRR without M/R/G.  Afebrile.  Abdomen soft nontender. ? ?Medical Decision Making  ?Medically screening exam initiated at 9:53 PM.  Appropriate orders placed.  Brinley Rosete was informed that the remainder of the evaluation will be completed by another provider, this initial triage assessment does not replace that evaluation, and the importance of remaining in the ED until their evaluation is complete. ? ?Labs ordered ?  ?Cecil Cobbs, PA-C ?08/08/21 2201 ? ?

## 2021-08-08 NOTE — ED Triage Notes (Signed)
Pt with nausea and diarrhea x 2 days.  Just feels tired and not well. No fevers or chills.  ?

## 2021-08-09 NOTE — ED Notes (Signed)
Patient states she is leaving d/t wait time 

## 2021-10-12 HISTORY — PX: SLEEVE GASTROPLASTY: SHX1101

## 2022-02-26 ENCOUNTER — Ambulatory Visit (INDEPENDENT_AMBULATORY_CARE_PROVIDER_SITE_OTHER): Payer: Medicaid Other

## 2022-02-26 VITALS — BP 133/85 | HR 92 | Wt 186.0 lb

## 2022-02-26 DIAGNOSIS — Z3201 Encounter for pregnancy test, result positive: Secondary | ICD-10-CM | POA: Diagnosis not present

## 2022-02-26 DIAGNOSIS — N926 Irregular menstruation, unspecified: Secondary | ICD-10-CM

## 2022-02-26 LAB — POCT URINE PREGNANCY: Preg Test, Ur: POSITIVE — AB

## 2022-02-26 NOTE — Progress Notes (Signed)
Subjective:    Miranda Curtis is a 21 y.o. female who presents for evaluation of amenorrhea. She believes she could be pregnant. Pregnancy is desired.  Last period was normal.    Last LMP: 01/25/2022   Lab Review Urine HCG: positive     Plan:    Pregnancy Test:  Positive: EDC: 11/01/2022. Briefly discussed positive results and sent to check out for scheduling for New OB appointments.

## 2022-03-05 ENCOUNTER — Telehealth: Payer: Self-pay

## 2022-03-05 NOTE — Telephone Encounter (Signed)
Patient called and LVM stating that she has a history of miscarriages, her last pregnancy was a miscarriage. She would like to have an ultrasound done sooner to ensure everything is ok. I called patient back to make sure she wasn't having any bleeding or pain and she was not. I also advised her that we can't do ultrasounds earlier because we may not be able to see anything since she is so early. She verbalized understanding.

## 2022-03-19 ENCOUNTER — Ambulatory Visit (INDEPENDENT_AMBULATORY_CARE_PROVIDER_SITE_OTHER): Payer: Medicaid Other

## 2022-03-19 VITALS — Wt 186.0 lb

## 2022-03-19 DIAGNOSIS — Z369 Encounter for antenatal screening, unspecified: Secondary | ICD-10-CM

## 2022-03-19 DIAGNOSIS — Z3480 Encounter for supervision of other normal pregnancy, unspecified trimester: Secondary | ICD-10-CM | POA: Insufficient documentation

## 2022-03-19 DIAGNOSIS — Z3689 Encounter for other specified antenatal screening: Secondary | ICD-10-CM

## 2022-03-19 NOTE — Progress Notes (Signed)
New OB Intake  I connected with  Miranda Curtis on 03/19/22 at 10:15 AM EST by telephone and verified that I am speaking with the correct person using two identifiers. Nurse is located at [facility] and pt is located at home.  I explained I am completing New OB Intake today. We discussed her EDD of 11/01/2022 that is based on LMP of 01/25/2022. Pt is G4/P1021. I reviewed her allergies, medications, Medical/Surgical/OB history, and appropriate screenings. Based on history, this is a/an pregnancy uncomplicated .   Patient Active Problem List   Diagnosis Date Noted   Supervision of other normal pregnancy, antepartum 03/19/2022   Anovulation 06/08/2020   Missed abortion 04/07/2019   History of depression 11/18/2017    Concerns addressed today Will she be concidered High Risk d/t her past health history; will d/w Dr Valentino Saxon.  Will also d/w Dr. Valentino Saxon about whether she will need an early glucose test or not.  Delivery Plans:  Plans to deliver at [facility] in [place].  Adv she will have someone deliver her she hasn't seen before.  Anatomy US Explained first scheduled [place] will be 04/04/22 and an anatomy scan will be done at 20 weeks.  Labs Discussed genetic screening with patient. Patient desires genetic testing to be drawn with new OB labs. Discussed possible labs to be drawn at new OB appointment.  COVID Vaccine Patient has had COVID vaccine.   Social Determinants of Health Food Insecurity: denies food insecurity Transportation: Patient denies transportation needs. Childcare: Discussed no children allowed at ultrasound appointments.   First visit review I reviewed new OB appt with pt. I explained she will have ob bloodwork and pap smear/pelvic exam if indicated. Explained pt will be seen by Dr. Hildred Laser at first visit; encounter routed to appropriate provider.   Loran Senters, [facility] 03/19/2022  10:44 AM

## 2022-03-26 ENCOUNTER — Telehealth: Payer: Self-pay

## 2022-03-26 NOTE — Telephone Encounter (Signed)
Miranda Curtis called triage line, questioning that she's cramping, has been cramping since Friday, wasn't taking anything. No bleeding  Told her she can take some Tylenol, try a heating pad for 10-15 minutes and increase water intake. As well cramping can be normal.   Advised patient if it got worse to go to the ED to be checked out and she didn't like that idea cause they told her she was having a miscarriage and she's not.

## 2022-04-04 ENCOUNTER — Other Ambulatory Visit: Payer: Medicaid Other

## 2022-04-04 ENCOUNTER — Ambulatory Visit (INDEPENDENT_AMBULATORY_CARE_PROVIDER_SITE_OTHER): Payer: Medicaid Other

## 2022-04-04 ENCOUNTER — Other Ambulatory Visit: Payer: Self-pay | Admitting: Obstetrics and Gynecology

## 2022-04-04 DIAGNOSIS — Z369 Encounter for antenatal screening, unspecified: Secondary | ICD-10-CM

## 2022-04-04 DIAGNOSIS — Z3687 Encounter for antenatal screening for uncertain dates: Secondary | ICD-10-CM

## 2022-04-04 DIAGNOSIS — Z3480 Encounter for supervision of other normal pregnancy, unspecified trimester: Secondary | ICD-10-CM

## 2022-04-04 DIAGNOSIS — Z3A09 9 weeks gestation of pregnancy: Secondary | ICD-10-CM

## 2022-04-06 LAB — CBC/D/PLT+RPR+RH+ABO+RUBIGG...
Antibody Screen: NEGATIVE
Basophils Absolute: 0 10*3/uL (ref 0.0–0.2)
Basos: 0 %
EOS (ABSOLUTE): 0.2 10*3/uL (ref 0.0–0.4)
Eos: 3 %
HCV Ab: NONREACTIVE
HIV Screen 4th Generation wRfx: NONREACTIVE
Hematocrit: 39.5 % (ref 34.0–46.6)
Hemoglobin: 13.5 g/dL (ref 11.1–15.9)
Hepatitis B Surface Ag: NEGATIVE
Immature Grans (Abs): 0 10*3/uL (ref 0.0–0.1)
Immature Granulocytes: 0 %
Lymphocytes Absolute: 1.7 10*3/uL (ref 0.7–3.1)
Lymphs: 24 %
MCH: 27.3 pg (ref 26.6–33.0)
MCHC: 34.2 g/dL (ref 31.5–35.7)
MCV: 80 fL (ref 79–97)
Monocytes Absolute: 0.4 10*3/uL (ref 0.1–0.9)
Monocytes: 6 %
Neutrophils Absolute: 4.7 10*3/uL (ref 1.4–7.0)
Neutrophils: 67 %
Platelets: 345 10*3/uL (ref 150–450)
RBC: 4.95 x10E6/uL (ref 3.77–5.28)
RDW: 12.7 % (ref 11.7–15.4)
RPR Ser Ql: NONREACTIVE
Rh Factor: POSITIVE
Rubella Antibodies, IGG: 1.21 index (ref >0.99)
Varicella zoster IgG: <135 index — ABNORMAL LOW (ref >165)
WBC: 7.1 10*3/uL (ref 3.4–10.8)

## 2022-04-06 LAB — HCV INTERPRETATION

## 2022-04-09 LAB — MATERNIT 21 PLUS CORE, BLOOD
Fetal Fraction: 8
Result (T21): NEGATIVE
Trisomy 13 (Patau syndrome): NEGATIVE
Trisomy 18 (Edwards syndrome): NEGATIVE
Trisomy 21 (Down syndrome): NEGATIVE

## 2022-04-09 NOTE — L&D Delivery Note (Signed)
OB/GYN Faculty Practice Delivery Note  Miranda Curtis is a 22 y.o. [id number] s/p SVD at [redacted]w[redacted]d. 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   ROM: 3h 52m with light meconium fluid GBS Status:  Positive/-- (06/27 1000) PCN ppx Maximum Maternal Temperature: 98.6 F  Labor Progress: Initial SVE: 4.5/80/-2. She then progressed to complete.   Delivery Date/Time: 10/25/2022 at 0856 Delivery: Called to room and patient was complete and pushing. Head delivered LOA. No nuchal cord present. Shoulder and body delivered in usual fashion. Infant with spontaneous cry, placed on mother's abdomen, dried and stimulated. Cord clamped x 2 after 1-minute delay, and cut by father. Cord blood drawn. Placenta delivered spontaneously with gentle cord traction. Fundus firm with massage and Pitocin. Labia, perineum, vagina, and cervix inspected with minor abrasion noted, hemodynamically stable, no need for repair.   Baby Weight: pending  Placenta: 3 vessel, intact. Sent to L&D Complications: None Lacerations: None EBL: 350 mL Analgesia: Epidural  Infant:  APGAR (1 MIN): 9  APGAR (5 MINS): 9   Glee Arvin, MD FM PGY-3 10/25/2022, 9:41 AM

## 2022-04-11 ENCOUNTER — Encounter: Payer: Self-pay | Admitting: Obstetrics and Gynecology

## 2022-04-11 ENCOUNTER — Ambulatory Visit (INDEPENDENT_AMBULATORY_CARE_PROVIDER_SITE_OTHER): Payer: Medicaid Other | Admitting: Obstetrics and Gynecology

## 2022-04-11 ENCOUNTER — Other Ambulatory Visit (HOSPITAL_COMMUNITY)
Admission: RE | Admit: 2022-04-11 | Discharge: 2022-04-11 | Disposition: A | Discharge status: Home / Self Care | Payer: Medicaid Other | Source: Ambulatory Visit | Attending: Obstetrics and Gynecology | Admitting: Obstetrics and Gynecology

## 2022-04-11 VITALS — BP 109/90 | HR 95 | Ht 60.0 in | Wt 180.9 lb

## 2022-04-11 DIAGNOSIS — O09899 Supervision of other high risk pregnancies, unspecified trimester: Secondary | ICD-10-CM

## 2022-04-11 DIAGNOSIS — Z8639 Personal history of other endocrine, nutritional and metabolic disease: Secondary | ICD-10-CM

## 2022-04-11 DIAGNOSIS — Z3480 Encounter for supervision of other normal pregnancy, unspecified trimester: Secondary | ICD-10-CM | POA: Diagnosis present

## 2022-04-11 DIAGNOSIS — Z9884 Bariatric surgery status: Secondary | ICD-10-CM | POA: Insufficient documentation

## 2022-04-11 DIAGNOSIS — O99841 Bariatric surgery status complicating pregnancy, first trimester: Secondary | ICD-10-CM

## 2022-04-11 DIAGNOSIS — Z3A10 10 weeks gestation of pregnancy: Secondary | ICD-10-CM | POA: Insufficient documentation

## 2022-04-11 DIAGNOSIS — O09291 Supervision of pregnancy with other poor reproductive or obstetric history, first trimester: Secondary | ICD-10-CM | POA: Diagnosis not present

## 2022-04-11 DIAGNOSIS — O99210 Obesity complicating pregnancy, unspecified trimester: Secondary | ICD-10-CM | POA: Insufficient documentation

## 2022-04-11 DIAGNOSIS — Z124 Encounter for screening for malignant neoplasm of cervix: Secondary | ICD-10-CM

## 2022-04-11 DIAGNOSIS — R8271 Bacteriuria: Secondary | ICD-10-CM

## 2022-04-11 DIAGNOSIS — E669 Obesity, unspecified: Secondary | ICD-10-CM

## 2022-04-11 DIAGNOSIS — O219 Vomiting of pregnancy, unspecified: Secondary | ICD-10-CM

## 2022-04-11 DIAGNOSIS — O99211 Obesity complicating pregnancy, first trimester: Secondary | ICD-10-CM

## 2022-04-11 DIAGNOSIS — Z8659 Personal history of other mental and behavioral disorders: Secondary | ICD-10-CM

## 2022-04-11 HISTORY — DX: Personal history of other endocrine, nutritional and metabolic disease: Z86.39

## 2022-04-11 LAB — POCT URINALYSIS DIPSTICK OB
Bilirubin, UA: NEGATIVE
Blood, UA: NEGATIVE
Glucose, UA: NEGATIVE
Ketones, UA: NEGATIVE
Leukocytes, UA: NEGATIVE
Nitrite, UA: NEGATIVE
Spec Grav, UA: 1.025 (ref 1.010–1.025)
Urobilinogen, UA: 2 E.U./dL — AB
pH, UA: 6 (ref 5.0–8.0)

## 2022-04-11 MED ORDER — BONJESTA 20-20 MG PO TBCR: 1.0000 | EXTENDED_RELEASE_TABLET | Freq: Two times a day (BID) | ORAL | 3 refills | Status: DC

## 2022-04-11 MED ORDER — PRENATE PIXIE 10-0.6-0.4-200 MG PO CAPS: 1.0000 | ORAL_CAPSULE | Freq: Every day | ORAL | 0 refills | Status: AC

## 2022-04-11 MED ORDER — ONDANSETRON 4 MG PO TBDP: 4.0000 mg | ORAL_TABLET | Freq: Four times a day (QID) | ORAL | 1 refills | Status: DC | PRN

## 2022-04-11 MED ORDER — PRENATE MINI 18-0.6-0.4-350 MG PO CAPS: 1.0000 | ORAL_CAPSULE | Freq: Every day | ORAL | 0 refills | Status: AC

## 2022-04-11 NOTE — Progress Notes (Signed)
OBSTETRIC INITIAL PRENATAL VISIT  Subjective:    Miranda Curtis is being seen today for her first obstetrical visit.  This is not a planned pregnancy. She is a 22 y.o. [id number] female at [redacted]w[redacted]d gestation, Estimated Date of Delivery: 11/01/22 with Patient's last menstrual period was 01/25/2022 (exact date).,  consistent with 9 week sono. Her obstetrical history is significant for obesity (history of bariatric surgery 6 months ago), GBS bacteriuria noted on recent urine culture at [facility] (performed 03/29/22), varicella non-immune. . Relationship with FOB: significant other, living together. Patient does intend to breast feed. Pregnancy history fully reviewed.   Patient complains of pelvic cramping x 2 weeks.  Denies vaginal bleeding, but still concerned aobut the pregnancy.   Patient also notes issues with daily nausea/vomiting, unable to keep down food, can still drink some fluids. Unable to take prenatal vitamins at this time.    OB History  Gravida Para Term Preterm AB Living  4 1 1  0 2 1  SAB IAB Ectopic Multiple Live Births  2 0 0 0 1    # Outcome Date GA Lbr Len/2nd Weight Sex Delivery Anes PTL Lv  4 Current           3 SAB 04/10/19 [redacted]w[redacted]d         2 Term 11/08/17 [redacted]w[redacted]d 21:32 / 00:51 6 lb 11.9 oz (3.06 kg) F Vag-Spont EPI N LIV     Name: [name]     Apgar1: 7  Apgar5: 9  1 SAB 10/07/16 [redacted]w[redacted]d           Gynecologic History:  Last pap smear was: patient has never had one.   Denies history of STIs.  Contraception prior to conception: None   Past Medical History:  Diagnosis Date   Anxiety    Attention deficit hyperactivity disorder (ADHD)    Depression    Eczema     Family History  Problem Relation Age of Onset   Diabetes Mother    Healthy Mother    Sickle cell trait Mother    Sickle cell trait Father    Rashes / Skin problems Father        eczema   Asthma Sister    Rashes / Skin problems Sister        eczema   Rashes / Skin problems Sister        eczema   Rashes / Skin  problems Sister        eczema   Sickle cell anemia Sister    Rashes / Skin problems Sister        eczema   Autism Brother    Healthy Brother    Diabetes Maternal Grandmother    Hypertension Maternal Grandmother    Cancer Maternal Grandfather        prostate   Cancer Paternal Grandmother 8       lung   Healthy Paternal Grandfather    Ovarian cancer Neg Hx    Colon cancer Neg Hx    Breast cancer Neg Hx     Past Surgical History:  Procedure Laterality Date   SLEEVE GASTROPLASTY  10/12/2021    Social History   Socioeconomic History   Marital status: Single    Spouse name: Not on file   Number of children: 1   Years of education: 13   Highest education level: Not on file  Occupational History   Occupation: 12/13/2021   Occupation: CNA  Tobacco Use   Smoking status: Never   Smokeless  tobacco: Never  Vaping Use   Vaping Use: Never used  Substance and Sexual Activity   Alcohol use: No   Drug use: No   Sexual activity: Yes    Partners: Male    Birth control/protection: None  Other Topics Concern   Not on file  Social History Narrative   Not on file   Social Determinants of Health   Financial Resource Strain: Low Risk  (03/19/2022)   Overall Financial Resource Strain (CARDIA)    Difficulty of Paying Living Expenses: Not hard at all  Food Insecurity: No Food Insecurity (03/19/2022)   Hunger Vital Sign    Worried About Running Out of Food in the Last Year: Never true    Ran Out of Food in the Last Year: Never true  Transportation Needs: No Transportation Needs (03/19/2022)   PRAPARE - Hydrologist (Medical): No    Lack of Transportation (Non-Medical): No  Physical Activity: Inactive (03/19/2022)   Exercise Vital Sign    Days of Exercise per Week: 0 days    Minutes of Exercise per Session: 0 min  Stress: No Stress Concern Present (03/19/2022)   [place]    Feeling of  Stress : Not at all  Social Connections: Unknown (03/19/2022)   Social Connection and Isolation Panel [NHANES]    Frequency of Communication with Friends and Family: More than three times a week    Frequency of Social Gatherings with Friends and Family: More than three times a week    Attends Religious Services: Never    Marine scientist or Organizations: No    Attends Archivist Meetings: Never    Marital Status: Not on file  Intimate Partner Violence: Not At Risk (03/19/2022)   Humiliation, Afraid, Rape, and Kick questionnaire    Fear of Current or Ex-Partner: No    Emotionally Abused: No    Physically Abused: No    Sexually Abused: No    No current outpatient medications on file prior to visit.   No current facility-administered medications on file prior to visit.    Allergies  Allergen Reactions   Metoclopramide Other (See Comments)    "Severe panic attack" "Severe panic attack" "Severe panic attack"     Review of Systems General: Not Present- Fever, Weight Loss and Weight Gain. Skin: Not Present- Rash. HEENT: Not Present- Blurred Vision, Headache and Bleeding Gums. Respiratory: Not Present- Difficulty Breathing. Breast: Not Present- Breast Mass. Cardiovascular: Not Present- Chest Pain, Elevated Blood Pressure, Fainting / Blacking Out and Shortness of Breath. Gastrointestinal: Not Present- Abdominal Pain, Constipation. Present - Nausea and Vomiting. Female Genitourinary: Not Present- Frequency, Painful Urination, Vaginal Bleeding, Vaginal Discharge, Contractions, regular, Fetal Movements Decreased, Urinary Complaints and Vaginal Fluid.  Present - Pelvic Cramping x 2 weeks.  Musculoskeletal: Not Present- Back Pain and Leg Cramps. Neurological: Not Present- Dizziness. Psychiatric: Not Present- Depression.     Objective:   Blood pressure (!) 109/90, pulse 95, weight 180 lb 14.4 oz (82.1 kg), last menstrual period 01/25/2022, unknown if currently  breastfeeding.  Body mass index is 35.33 kg/m.  General Appearance:    Alert, cooperative, no distress, appears stated age, moderate obesity  Head:    Normocephalic, without obvious abnormality, atraumatic  Eyes:    PERRL, conjunctiva/corneas clear, EOM's intact, both eyes  Ears:    Normal external ear canals, both ears  Nose:   Nares normal, septum midline, mucosa normal, no drainage  or sinus tenderness  Throat:   Lips, mucosa, and tongue normal; teeth and gums normal  Neck:   Supple, symmetrical, trachea midline, no adenopathy; thyroid: no enlargement/tenderness/nodules; no carotid bruit or JVD  Back:     Symmetric, no curvature, ROM normal, no CVA tenderness  Lungs:     Clear to auscultation bilaterally, respirations unlabored  Chest Wall:    No tenderness or deformity   Heart:    Regular rate and rhythm, S1 and S2 normal, no murmur, rub or gallop  Breast Exam:    No tenderness, masses, or nipple abnormality  Abdomen:     Soft, non-tender, bowel sounds active all four quadrants, no masses, no organomegaly.  FHT 170  bpm.  Genitalia:    Pelvic:external genitalia normal, vagina without lesions, discharge, or tenderness, rectovaginal septum  normal. Cervix normal in appearance, no cervical motion tenderness, no adnexal masses or tenderness.  Pregnancy positive findings: uterine enlargement: 10 wk size, nontender.   Rectal:    Normal external sphincter.  No hemorrhoids appreciated. Internal exam not done.   Extremities:   Extremities normal, atraumatic, no cyanosis or edema  Pulses:   2+ and symmetric all extremities  Skin:   Skin color, texture, turgor normal, no rashes or lesions  Lymph nodes:   Cervical, supraclavicular, and axillary nodes normal  Neurologic:   CNII-XII intact, normal strength, sensation and reflexes throughout         04/11/2022   11:01 AM 12/16/2017   11:36 AM 11/18/2017   11:12 AM  Depression screen PHQ 2/9  Decreased Interest 1 3 1   Down, Depressed, Hopeless 0 1 1   PHQ - 2 Score 1 4 2   Altered sleeping 0 3 2  Tired, decreased energy 2 3 2   Change in appetite 3 0 0  Feeling bad or failure about yourself  0 0 0  Trouble concentrating 0 1 1  Moving slowly or fidgety/restless 0 1 1  Suicidal thoughts 0 0 0  PHQ-9 Score 6 12 8   Difficult doing work/chores Not difficult at all      Assessment:   1. Supervision of other normal pregnancy, antepartum   2. Cervical cancer screening   3. [redacted] weeks gestation of pregnancy   4. History of depression   5. History of bariatric surgery   6. Maternal varicella, non-immune   7. History of diabetes mellitus   8. Nausea and vomiting during pregnancy   9. Obesity in pregnancy   10. GBS bacteriuria     Plan:   1. Supervision of other normal pregnancy, antepartum - Initial labs reviewed. - Prenatal vitamins encouraged. - Problem list reviewed and updated. - New OB counseling:  The patient has been given an overview regarding routine prenatal care.  Recommendations regarding diet, weight gain, and exercise in pregnancy were given. - Prenatal testing, optional genetic testing, and ultrasound use in pregnancy were reviewed.  Traditional genetic screening vs cell-fee DNA genetic screening discussed, including risks and benefits. Testing results reviewed, normal [id number] - Benefits of Breast Feeding were discussed. The patient is encouraged to consider nursing her baby post partum. - The patient has Medicaid.  [facility] Medicaid Risk Screening Form completed today   2. Cervical cancer screening - Cytology - PAP performed today.   3.  History of depression - Patient reports remote history, also with anxieiety, mostly associated postpartum from last pregnancy.  Baseline PHQ-9 performed today, neg.   4. History of bariatric surgery - Became pregnant within 6 months after  surgery. Advised that she was at risk for vitamin deficiencies and growth restriction. Will order nutrition labs. Also will need growth  surveillance wtih ultrasound between 30-34 weeks.     5. Maternal varicella, non-immune - Recommend vaccination postpartum  6.  History of diabetes - Reports having h/o diabetes, was noted after pregnancy.  After bariatric surgery, was no longer considered to have it.  Will order HgbA1c.    7. Nausea and vomiting of pregnancy - Given prescriptions for Bonjesta and Zofran ODT. Also given samples of Bonjesta in the office.   8. Obesity in pregnancy - Moderate obesity, BMI 35.  Will need to begin antenatal surveillance at towards end of third trimester if BMI climbs.  - TSH orderede.   9.  GBS bacteruria - Recent urine culture performed at [facility] during triage visit for the pelvic cramping noted GBS bacteriuria (however no true UTI).  Discussed need for antibiotics during labor, will not require screening in 3rd trimester.     Follow up in 4 weeks.    Hildred Laser, MD [facility] at [facility]

## 2022-04-11 NOTE — Progress Notes (Signed)
ROB [redacted]w[redacted]d: She is doing well. She has been having a lot of cramping, but no bleeding x 2 weeks. Medicaid form completed. She has not been taking any prenatal vitals. She hasn't been able to keep anything down.

## 2022-04-12 ENCOUNTER — Encounter: Payer: Self-pay | Admitting: Obstetrics and Gynecology

## 2022-04-12 LAB — HEMOGLOBIN A1C
Est. average glucose Bld gHb Est-mCnc: 80 mg/dL
Hgb A1c MFr Bld: 4.4 % — ABNORMAL LOW (ref 4.8–5.6)

## 2022-04-12 LAB — TSH: TSH: 0.197 u[IU]/mL — ABNORMAL LOW (ref 0.450–4.500)

## 2022-04-13 LAB — CYTOLOGY - PAP: Diagnosis: NEGATIVE

## 2022-04-13 MED ORDER — BONJESTA 20-20 MG PO TBCR: 1.0000 | EXTENDED_RELEASE_TABLET | Freq: Two times a day (BID) | ORAL | 0 refills | Status: DC

## 2022-04-13 NOTE — Addendum Note (Signed)
Addended by: Augusto Gamble on: 04/13/2022 07:47 AM   Modules accepted: Orders

## 2022-04-13 NOTE — Addendum Note (Signed)
Addended by: Chilton Greathouse on: 04/13/2022 08:20 AM   Modules accepted: Orders

## 2022-04-14 LAB — T3 UPTAKE: T3 Uptake Ratio: 17 % — ABNORMAL LOW (ref 24–39)

## 2022-04-14 LAB — T4, FREE: Free T4: 1.22 ng/dL (ref 0.82–1.77)

## 2022-04-14 LAB — SPECIMEN STATUS REPORT

## 2022-04-15 LAB — MAGNESIUM: Magnesium: 1.7 mg/dL (ref 1.6–2.3)

## 2022-04-15 LAB — VITAMIN D 25 HYDROXY (VIT D DEFICIENCY, FRACTURES): Vit D, 25-Hydroxy: 6.9 ng/mL — ABNORMAL LOW (ref 30.0–100.0)

## 2022-04-15 LAB — ZINC: Zinc: 55 ug/dL (ref 44–115)

## 2022-04-15 LAB — FOLATE: Folate: 7.7 ng/mL (ref >3.0)

## 2022-04-15 LAB — VITAMIN B12: Vitamin B-12: 150 pg/mL — ABNORMAL LOW (ref 232–1245)

## 2022-04-15 LAB — CALCIUM: Calcium: 9.4 mg/dL (ref 8.7–10.2)

## 2022-04-16 ENCOUNTER — Other Ambulatory Visit: Payer: Self-pay | Admitting: Obstetrics and Gynecology

## 2022-04-16 MED ORDER — VITAMIN D (ERGOCALCIFEROL) 1.25 MG (50000 UNIT) PO CAPS: 50000.0000 [IU] | ORAL_CAPSULE | ORAL | 0 refills | Status: DC

## 2022-05-02 ENCOUNTER — Inpatient Hospital Stay (HOSPITAL_COMMUNITY)
Admission: AD | Admit: 2022-05-02 | Discharge: 2022-05-02 | Disposition: A | Discharge status: Home / Self Care | Payer: Medicaid Other | Attending: Obstetrics and Gynecology | Admitting: Obstetrics and Gynecology

## 2022-05-02 ENCOUNTER — Encounter (HOSPITAL_COMMUNITY): Payer: Self-pay | Admitting: Obstetrics and Gynecology

## 2022-05-02 DIAGNOSIS — O209 Hemorrhage in early pregnancy, unspecified: Secondary | ICD-10-CM | POA: Diagnosis not present

## 2022-05-02 DIAGNOSIS — O26891 Other specified pregnancy related conditions, first trimester: Secondary | ICD-10-CM

## 2022-05-02 DIAGNOSIS — O26851 Spotting complicating pregnancy, first trimester: Secondary | ICD-10-CM

## 2022-05-02 DIAGNOSIS — O219 Vomiting of pregnancy, unspecified: Secondary | ICD-10-CM | POA: Diagnosis not present

## 2022-05-02 DIAGNOSIS — R109 Unspecified abdominal pain: Secondary | ICD-10-CM | POA: Insufficient documentation

## 2022-05-02 DIAGNOSIS — Z3A13 13 weeks gestation of pregnancy: Secondary | ICD-10-CM | POA: Diagnosis not present

## 2022-05-02 DIAGNOSIS — O26899 Other specified pregnancy related conditions, unspecified trimester: Secondary | ICD-10-CM

## 2022-05-02 LAB — URINALYSIS, ROUTINE W REFLEX MICROSCOPIC
Bilirubin Urine: NEGATIVE
Glucose, UA: NEGATIVE mg/dL
Hgb urine dipstick: NEGATIVE
Ketones, ur: NEGATIVE mg/dL
Leukocytes,Ua: NEGATIVE
Nitrite: NEGATIVE
Protein, ur: 30 mg/dL — AB
Specific Gravity, Urine: 1.024 (ref 1.005–1.030)
pH: 6 (ref 5.0–8.0)

## 2022-05-02 LAB — WET PREP, GENITAL
Clue Cells Wet Prep HPF POC: NONE SEEN
Sperm: NONE SEEN
Trich, Wet Prep: NONE SEEN
WBC, Wet Prep HPF POC: <10 (ref <10)
Yeast Wet Prep HPF POC: NONE SEEN

## 2022-05-02 NOTE — MAU Provider Note (Signed)
History     CSN: [id number]  Arrival date and time: 05/02/22 1203   Event Date/Time   First Provider Initiated Contact with Patient 05/02/22 1241      Chief Complaint  Patient presents with   Vaginal Bleeding   HPI [name] is a 22 y.o. year old [id number] female at [redacted]w[redacted]d weeks gestation who presents to MAU reporting brown vaginal spotting this AM after she vomited. She was sitting on the floor and noticed she felt wet down there. When she checked, she saw brown spotting on the floor. She denies recent SI, but she did have non-penetrable masturbation which resulted in an orgasm this morning at ~ 0700. That is CONFIDENTIAL information she does not want her mother to know about. Her mother was not present for this portion of the history taking. She receives [facility] with [facility]; next appt is 05/09/2022. Her mother is present and contributing to the history taking.   OB History     Gravida  4   Para  1   Term  1   Preterm      AB  2   Living  1      SAB  2   IAB      Ectopic      Multiple  0   Live Births  1           Past Medical History:  Diagnosis Date   Anxiety    Attention deficit hyperactivity disorder (ADHD)    Depression    Eczema     Past Surgical History:  Procedure Laterality Date   SLEEVE GASTROPLASTY  10/12/2021    Family History  Problem Relation Age of Onset   Diabetes Mother    Healthy Mother    Sickle cell trait Mother    Sickle cell trait Father    Rashes / Skin problems Father        eczema   Asthma Sister    Rashes / Skin problems Sister        eczema   Rashes / Skin problems Sister        eczema   Rashes / Skin problems Sister        eczema   Sickle cell anemia Sister    Rashes / Skin problems Sister        eczema   Autism Brother    Healthy Brother    Diabetes Maternal Grandmother    Hypertension Maternal Grandmother    Cancer Maternal Grandfather        prostate   Cancer Paternal Grandmother 61        lung   Healthy Paternal Grandfather    Ovarian cancer Neg Hx    Colon cancer Neg Hx    Breast cancer Neg Hx     Social History   Tobacco Use   Smoking status: Never   Smokeless tobacco: Never  Vaping Use   Vaping Use: Never used  Substance Use Topics   Alcohol use: No   Drug use: No    Allergies:  Allergies  Allergen Reactions   Metoclopramide Other (See Comments)    "Severe panic attack" "Severe panic attack" "Severe panic attack"    No medications prior to admission.    Review of Systems Physical Exam   Blood pressure 121/75, pulse 92, temperature 98.1 F (36.7 C), temperature source Oral, resp. rate 19, height 5\' 1"  (1.549 m), weight 80.3 kg, last menstrual period 01/25/2022, SpO2 100 %, unknown if  currently breastfeeding.  Physical Exam Vitals and nursing note reviewed.  Constitutional:      Appearance: Normal appearance. She is obese.  Cardiovascular:     Rate and Rhythm: Tachycardia present.  Pulmonary:     Effort: Pulmonary effort is normal.  Abdominal:     Palpations: Abdomen is soft.     Comments: S=D  Genitourinary:    Comments: Not examined Musculoskeletal:        General: Normal range of motion.  Skin:    General: Skin is warm and dry.  Neurological:     Mental Status: She is alert and oriented to person, place, and time.  Psychiatric:        Mood and Affect: Mood normal.        Behavior: Behavior normal.        Thought Content: Thought content normal.        Judgment: Judgment normal.    FHTs by doppler: 156 bpm  MAU Course  Procedures  Patient informed that the ultrasound is considered a limited OB ultrasound and is not intended to be a complete ultrasound exam.  Patient also informed that the ultrasound is not being completed with the intent of assessing for fetal or placental anomalies or any pelvic abnormalities.  Explained that the purpose of today's ultrasound is to assess for viability.  Baby was found to be viable and actively  moving. Reassurance given to patient and her mother. Patient acknowledges the purpose of the exam and the limitations of the study.  MDM Wet Prep GC/CT -- Results pending Informal BS U/S  Results for orders placed or performed during the hospital encounter of 05/02/22 (from the past 24 hour(s))  Wet prep, genital     Status: None   Collection Time: 05/02/22 12:58 PM   Specimen: Vaginal  Result Value Ref Range   Yeast Wet Prep HPF POC NONE SEEN NONE SEEN   Trich, Wet Prep NONE SEEN NONE SEEN   Clue Cells Wet Prep HPF POC NONE SEEN NONE SEEN   WBC, Wet Prep HPF POC <10 <10   Sperm NONE SEEN   Urinalysis, Routine w reflex microscopic -Urine, Clean Catch     Status: Abnormal   Collection Time: 05/02/22 12:58 PM  Result Value Ref Range   Color, Urine AMBER (A) YELLOW   APPearance HAZY (A) CLEAR   Specific Gravity, Urine 1.024 1.005 - 1.030   pH 6.0 5.0 - 8.0   Glucose, UA NEGATIVE NEGATIVE mg/dL   Hgb urine dipstick NEGATIVE NEGATIVE   Bilirubin Urine NEGATIVE NEGATIVE   Ketones, ur NEGATIVE NEGATIVE mg/dL   Protein, ur 30 (A) NEGATIVE mg/dL   Nitrite NEGATIVE NEGATIVE   Leukocytes,Ua NEGATIVE NEGATIVE   RBC / HPF 0-5 0 - 5 RBC/hpf   WBC, UA 0-5 0 - 5 WBC/hpf   Bacteria, UA RARE (A) NONE SEEN   Squamous Epithelial / HPF 0-5 0 - 5 /HPF   Mucus PRESENT      Assessment and Plan  1. Spotting and cramping affecting pregnancy, antepartum - Information provided on VB in 1st trimester pregnancy - Return to MAU: If you have heavier bleeding that soaks through more that 2 pads per hour for an hour or more If you bleed so much that you feel like you might pass out or you do pass out If you have significant abdominal pain that is not improved with Tylenol 1000 mg every 8 hours as needed for pain If you develop a fever > 100.5  2. [redacted] weeks gestation of pregnancy   - Discharge patient - Keep scheduled appt with [facility] on 05/09/2022 - Patient verbalized an understanding of the  plan of care and agrees.   Laury Deep, CNM 05/02/2022, 12:41 PM

## 2022-05-02 NOTE — MAU Note (Addendum)
...  Miranda Curtis is a 22 y.o. at [redacted]w[redacted]d here in MAU reporting: [name] vaginal bleeding noted this morning after an emesis episode at 1100. She reports she was sitting down on the floor and felt something wet and when she looked down she noted a spot of brown blood on the floor. She reports she then wiped and had one blood clot. Denies recent IC but endorses masturbation earlier this morning around 0800. She denies using any toys and reports there was no penetration involved.  Patient does not wish for masturbation to be talked about in front of her mother. Mother waiting in lobby for initial process with RN and CNM.   Onset of complaint: This morning at 1100 Pain score: Endorses a constant cramp like pain that does not go away. She reports this has been ongoing throughout her pregnancy. 3/10 lower abdomen  FHT: 156 doppler

## 2022-05-02 NOTE — Discharge Instructions (Signed)
Return to MAU: If you have heavier bleeding that soaks through more that 2 pads per hour for an hour or more If you bleed so much that you feel like you might pass out or you do pass out If you have significant abdominal pain that is not improved with Tylenol 1000 mg every 8 hours as needed for pain If you develop a fever > 100.5  

## 2022-05-03 LAB — GC/CHLAMYDIA PROBE AMP (~~LOC~~) NOT AT ARMC
Chlamydia: NEGATIVE
Comment: NEGATIVE
Comment: NORMAL
Neisseria Gonorrhea: NEGATIVE

## 2022-05-09 ENCOUNTER — Encounter: Payer: Self-pay | Admitting: Obstetrics and Gynecology

## 2022-05-09 ENCOUNTER — Ambulatory Visit (INDEPENDENT_AMBULATORY_CARE_PROVIDER_SITE_OTHER): Payer: Medicaid Other | Admitting: Obstetrics and Gynecology

## 2022-05-09 VITALS — BP 101/74 | HR 100 | Wt 179.0 lb

## 2022-05-09 DIAGNOSIS — E669 Obesity, unspecified: Secondary | ICD-10-CM

## 2022-05-09 DIAGNOSIS — D538 Other specified nutritional anemias: Secondary | ICD-10-CM | POA: Diagnosis not present

## 2022-05-09 DIAGNOSIS — O99842 Bariatric surgery status complicating pregnancy, second trimester: Secondary | ICD-10-CM | POA: Diagnosis not present

## 2022-05-09 DIAGNOSIS — E559 Vitamin D deficiency, unspecified: Secondary | ICD-10-CM

## 2022-05-09 DIAGNOSIS — Z9884 Bariatric surgery status: Secondary | ICD-10-CM

## 2022-05-09 DIAGNOSIS — Z3A15 15 weeks gestation of pregnancy: Secondary | ICD-10-CM

## 2022-05-09 DIAGNOSIS — Z3A14 14 weeks gestation of pregnancy: Secondary | ICD-10-CM

## 2022-05-09 DIAGNOSIS — O99012 Anemia complicating pregnancy, second trimester: Secondary | ICD-10-CM | POA: Diagnosis not present

## 2022-05-09 DIAGNOSIS — E66811 Obesity, class 1: Secondary | ICD-10-CM | POA: Insufficient documentation

## 2022-05-09 DIAGNOSIS — O99282 Endocrine, nutritional and metabolic diseases complicating pregnancy, second trimester: Secondary | ICD-10-CM

## 2022-05-09 DIAGNOSIS — E538 Deficiency of other specified B group vitamins: Secondary | ICD-10-CM

## 2022-05-09 DIAGNOSIS — Z3480 Encounter for supervision of other normal pregnancy, unspecified trimester: Secondary | ICD-10-CM

## 2022-05-09 HISTORY — DX: Vitamin D deficiency, unspecified: E55.9

## 2022-05-09 LAB — POCT URINALYSIS DIPSTICK OB
Bilirubin, UA: NEGATIVE
Blood, UA: NEGATIVE
Glucose, UA: NEGATIVE
Ketones, UA: NEGATIVE
Leukocytes, UA: NEGATIVE
Nitrite, UA: NEGATIVE
Spec Grav, UA: 1.02 (ref 1.010–1.025)
Urobilinogen, UA: 0.2 E.U./dL
pH, UA: 6 (ref 5.0–8.0)

## 2022-05-09 MED ORDER — CYANOCOBALAMIN 1000 MCG/ML IJ SOLN
1000.0000 ug | Freq: Once | INTRAMUSCULAR | Status: AC
  Administered 2022-05-09: 1000 ug via INTRAMUSCULAR

## 2022-05-09 NOTE — Progress Notes (Signed)
ROB: Patient is a 22 y.o. [id number] at [redacted]w[redacted]d who presents for routine OB care. Was seen in the ER ~ 1 week ago due to bleeding in pregnancy. Notes bleeding lasted x 1 day. Reviewed recent labs, recent A1c normal (had h/o diabetes prior to bariatric surgery, appears to have resolved). Vitamin D and B12 levels fairly low. Prescribed high dose Vit D supplements, given B12 injection today. Can recheck in 3rd trimester. TSH mildly low but fee T4 wnl.  Can repeat with AFP when performed. Patient had mentioned her plans for delivering in [place] (states this is her partner's peference). Patient herself is unsure. Encouraged patient to bring partner for visit, tour [facility] facility.  Patient is aware that if she delivers in [place], should attempt to establish [facility] there as she otherwise will not know any of the providers at the time of her delivery.  RTC in 4 weeks. For anatomy scan, AFP, and repeat TSH then.

## 2022-05-09 NOTE — Progress Notes (Signed)
ROB [redacted]w[redacted]d: She is doing well. She had some bleeding last week, she was evaluated at ED. Bleeding has resolved. She has no new concerns today.

## 2022-05-14 ENCOUNTER — Encounter (HOSPITAL_COMMUNITY): Payer: Self-pay | Admitting: Obstetrics & Gynecology

## 2022-05-14 ENCOUNTER — Other Ambulatory Visit: Payer: Self-pay

## 2022-05-14 ENCOUNTER — Inpatient Hospital Stay (HOSPITAL_COMMUNITY)
Admission: AD | Admit: 2022-05-14 | Discharge: 2022-05-14 | Disposition: A | Discharge status: Home / Self Care | Payer: Medicaid Other | Attending: Obstetrics & Gynecology | Admitting: Obstetrics & Gynecology

## 2022-05-14 DIAGNOSIS — R079 Chest pain, unspecified: Secondary | ICD-10-CM | POA: Insufficient documentation

## 2022-05-14 DIAGNOSIS — O26892 Other specified pregnancy related conditions, second trimester: Secondary | ICD-10-CM | POA: Insufficient documentation

## 2022-05-14 DIAGNOSIS — Y939 Activity, unspecified: Secondary | ICD-10-CM | POA: Diagnosis not present

## 2022-05-14 DIAGNOSIS — T148XXA Other injury of unspecified body region, initial encounter: Secondary | ICD-10-CM | POA: Diagnosis not present

## 2022-05-14 DIAGNOSIS — R109 Unspecified abdominal pain: Secondary | ICD-10-CM | POA: Insufficient documentation

## 2022-05-14 DIAGNOSIS — Z3A15 15 weeks gestation of pregnancy: Secondary | ICD-10-CM | POA: Insufficient documentation

## 2022-05-14 DIAGNOSIS — R42 Dizziness and giddiness: Secondary | ICD-10-CM | POA: Insufficient documentation

## 2022-05-14 DIAGNOSIS — Y929 Unspecified place or not applicable: Secondary | ICD-10-CM | POA: Diagnosis not present

## 2022-05-14 DIAGNOSIS — O9A212 Injury, poisoning and certain other consequences of external causes complicating pregnancy, second trimester: Secondary | ICD-10-CM | POA: Diagnosis not present

## 2022-05-14 DIAGNOSIS — N760 Acute vaginitis: Secondary | ICD-10-CM

## 2022-05-14 DIAGNOSIS — O23592 Infection of other part of genital tract in pregnancy, second trimester: Secondary | ICD-10-CM | POA: Insufficient documentation

## 2022-05-14 DIAGNOSIS — B9689 Other specified bacterial agents as the cause of diseases classified elsewhere: Secondary | ICD-10-CM | POA: Diagnosis not present

## 2022-05-14 HISTORY — DX: Type 2 diabetes mellitus without complications: E11.9

## 2022-05-14 LAB — URINALYSIS, ROUTINE W REFLEX MICROSCOPIC
Bilirubin Urine: NEGATIVE
Glucose, UA: NEGATIVE mg/dL
Hgb urine dipstick: NEGATIVE
Ketones, ur: NEGATIVE mg/dL
Nitrite: NEGATIVE
Protein, ur: NEGATIVE mg/dL
Specific Gravity, Urine: 1.024 (ref 1.005–1.030)
pH: 6 (ref 5.0–8.0)

## 2022-05-14 LAB — COMPREHENSIVE METABOLIC PANEL
ALT: 7 U/L (ref 0–44)
AST: 14 U/L — ABNORMAL LOW (ref 15–41)
Albumin: 3.2 g/dL — ABNORMAL LOW (ref 3.5–5.0)
Alkaline Phosphatase: 29 U/L — ABNORMAL LOW (ref 38–126)
Anion gap: 9 (ref 5–15)
BUN: 5 mg/dL — ABNORMAL LOW (ref 6–20)
CO2: 21 mmol/L — ABNORMAL LOW (ref 22–32)
Calcium: 9 mg/dL (ref 8.9–10.3)
Chloride: 104 mmol/L (ref 98–111)
Creatinine, Ser: 0.59 mg/dL (ref 0.44–1.00)
GFR, Estimated: >60 mL/min (ref >60)
Glucose, Bld: 77 mg/dL (ref 70–99)
Potassium: 3.5 mmol/L (ref 3.5–5.1)
Sodium: 134 mmol/L — ABNORMAL LOW (ref 135–145)
Total Bilirubin: 0.8 mg/dL (ref 0.3–1.2)
Total Protein: 6.4 g/dL — ABNORMAL LOW (ref 6.5–8.1)

## 2022-05-14 LAB — CBC
HCT: 38.1 % (ref 36.0–46.0)
Hemoglobin: 13.1 g/dL (ref 12.0–15.0)
MCH: 27.7 pg (ref 26.0–34.0)
MCHC: 34.4 g/dL (ref 30.0–36.0)
MCV: 80.5 fL (ref 80.0–100.0)
Platelets: 321 10*3/uL (ref 150–400)
RBC: 4.73 MIL/uL (ref 3.87–5.11)
RDW: 12.7 % (ref 11.5–15.5)
WBC: 9 10*3/uL (ref 4.0–10.5)
nRBC: 0 % (ref 0.0–0.2)

## 2022-05-14 LAB — WET PREP, GENITAL
Sperm: NONE SEEN
Trich, Wet Prep: NONE SEEN
WBC, Wet Prep HPF POC: <10 (ref <10)
Yeast Wet Prep HPF POC: NONE SEEN

## 2022-05-14 MED ORDER — ACETAMINOPHEN 500 MG PO TABS
1000.0000 mg | ORAL_TABLET | Freq: Once | ORAL | Status: AC
  Administered 2022-05-14: 1000 mg via ORAL
  Filled 2022-05-14: qty 2

## 2022-05-14 MED ORDER — METRONIDAZOLE 500 MG PO TABS: 500.0000 mg | ORAL_TABLET | Freq: Two times a day (BID) | ORAL | 0 refills | Status: DC

## 2022-05-14 MED ORDER — CYCLOBENZAPRINE HCL 5 MG PO TABS
10.0000 mg | ORAL_TABLET | Freq: Once | ORAL | Status: AC
  Administered 2022-05-14: 10 mg via ORAL
  Filled 2022-05-14: qty 2

## 2022-05-14 NOTE — MAU Note (Signed)
.  Miranda Curtis is a 22 y.o. at [redacted]w[redacted]d here in MAU reporting: past ocuple of days have been lightheaded, along with having chest pains - sharp pain in middle and right side of chest that comes and goes - and constant lower abdominal cramping. Has had cramps and pressure before, but this is "different" - "I feel like I can feel it in my vagina" denies VB or LOF.   Onset of complaint: 3 days Pain score: 6 -chest; 7 - abdomen Vitals:   05/14/22 1914  BP: 112/70  Pulse: 95  Resp: 18  Temp: 98.6 F (37 C)  SpO2: 100%     FHT:151 Lab orders placed from triage:  UA

## 2022-05-14 NOTE — MAU Provider Note (Signed)
History     CSN: [id number]  Arrival date and time: 05/14/22 2409   Event Date/Time   First Provider Initiated Contact with Patient 05/14/22 2010      Chief Complaint  Patient presents with   Abdominal Pain   light headed   Chest Pain   Miranda Curtis , a  22 y.o. [id number] at [redacted]w[redacted]d presents to MAU with complaints of chest pain and abdominal cramping since Saturday. Patient reports chest pain started Saturday while up caring for her daughter. She points to midsternum and states that it radiates over to the right side and describes pain as cramping and pulling. She states she attempted to relieve pain with Tylenol on Saturday but experienced no relief. She describes pain as intermittent and dull currently rating pain a 6/10. She states today when she felt it she was standing in the kitchen cooking and began to feel light headed and dizzy. Thought she was going to "pass out at the stove".     The abdominal cramping also started Saturday. States this pain is more constant and "hurts so bad she feels it in her vagina." She points to low pelvis and states it radiates to her side. She denies pain worsened by movement. She states she thought the Tylenol would help but has not. She denies vaginal and urinary symptoms. She endorses 3-4 bottles of water for intake per day but states that she "doubts she is getting any because she quickly throws it back up after drinking." She denies vaginal bleeding and leaking of fluid.          OB History     Gravida  4   Para  1   Term  1   Preterm      AB  2   Living  1      SAB  2   IAB      Ectopic      Multiple  0   Live Births  1           Past Medical History:  Diagnosis Date   Anxiety    Attention deficit hyperactivity disorder (ADHD)    Depression    Diabetes mellitus without complication (HCC)    states prior to gastric sleeve surgery-has resolved following surgery   Eczema     Past Surgical History:  Procedure  Laterality Date   SLEEVE GASTROPLASTY  10/12/2021   UPPER GASTROINTESTINAL ENDOSCOPY N/A     Family History  Problem Relation Age of Onset   Diabetes Mother    Healthy Mother    Sickle cell trait Mother    Sickle cell trait Father    Rashes / Skin problems Father        eczema   Asthma Sister    Rashes / Skin problems Sister        eczema   Rashes / Skin problems Sister        eczema   Rashes / Skin problems Sister        eczema   Sickle cell anemia Sister    Rashes / Skin problems Sister        eczema   Autism Brother    Healthy Brother    Diabetes Maternal Grandmother    Hypertension Maternal Grandmother    Cancer Maternal Grandfather        prostate   Cancer Paternal Grandmother 59       lung   Healthy Paternal Grandfather    Ovarian cancer Neg  Hx    Colon cancer Neg Hx    Breast cancer Neg Hx     Social History   Tobacco Use   Smoking status: Never   Smokeless tobacco: Never  Vaping Use   Vaping Use: Never used  Substance Use Topics   Alcohol use: No   Drug use: No    Allergies:  Allergies  Allergen Reactions   Metoclopramide Other (See Comments)    "Severe panic attack" "Severe panic attack" "Severe panic attack"    Medications Prior to Admission  Medication Sig Dispense Refill Last Dose   Prenatal Vit-Fe Fumarate-FA (PRENATAL VITAMIN PO) Take 1 tablet by mouth daily at 6 (six) AM.      Doxylamine-Pyridoxine ER (BONJESTA) 20-20 MG TBCR Take 1 tablet by mouth 2 (two) times daily. 12 tablet 0    ondansetron (ZOFRAN-ODT) 4 MG disintegrating tablet Take 1 tablet (4 mg total) by mouth every 6 (six) hours as needed for nausea. 20 tablet 1    Vitamin D, Ergocalciferol, (DRISDOL) 1.25 MG (50000 UNIT) CAPS capsule Take 1 capsule (50,000 Units total) by mouth every 7 (seven) days. 12 capsule 0     Review of Systems  Constitutional:  Negative for chills, fatigue and fever.  Eyes:  Negative for pain and visual disturbance.  Respiratory:  Negative for  apnea, shortness of breath and wheezing.   Cardiovascular:  Positive for chest pain. Negative for palpitations.  Gastrointestinal:  Positive for abdominal pain. Negative for constipation, diarrhea, nausea and vomiting.  Genitourinary:  Positive for pelvic pain. Negative for difficulty urinating, dysuria, vaginal bleeding, vaginal discharge and vaginal pain.  Musculoskeletal:  Negative for back pain.  Neurological:  Positive for dizziness and light-headedness. Negative for seizures, weakness and headaches.  Psychiatric/Behavioral:  Negative for suicidal ideas.    Physical Exam   Blood pressure 117/68, pulse (!) 120, temperature 98.6 F (37 C), temperature source Oral, resp. rate 18, height 5' (1.524 m), weight 81.4 kg, last menstrual period 01/25/2022, SpO2 99 %, unknown if currently breastfeeding.  Physical Exam Vitals and nursing note reviewed. Exam conducted with a chaperone present.  Constitutional:      General: She is not in acute distress.    Appearance: Normal appearance.  HENT:     Head: Normocephalic.  Pulmonary:     Effort: Pulmonary effort is normal.  Abdominal:     Palpations: Abdomen is soft.     Tenderness: There is abdominal tenderness in the right lower quadrant and left lower quadrant. There is no guarding. Negative signs include Murphy's sign.  Genitourinary:    Vagina: Normal. No vaginal discharge.     Cervix: Normal.     Comments: Cervix closed  Musculoskeletal:     Cervical back: Normal range of motion.  Skin:    General: Skin is warm and dry.     Capillary Refill: Capillary refill takes less than 2 seconds.  Neurological:     Mental Status: She is alert and oriented to person, place, and time.  Psychiatric:        Mood and Affect: Mood normal.    FHT obtained in triage.   MAU Course  Procedures Orders Placed This Encounter  Procedures   Wet prep, genital   Urinalysis, Routine w reflex microscopic -Urine, Clean Catch   Comprehensive metabolic panel    CBC   Orthostatic vital signs   EKG 12-Lead   Results for orders placed or performed during the hospital encounter of 05/14/22 (from the past 24 hour(s))  Urinalysis, Routine w reflex microscopic -Urine, Clean Catch     Status: Abnormal   Collection Time: 05/14/22  7:27 PM  Result Value Ref Range   Color, Urine YELLOW YELLOW   APPearance CLOUDY (A) CLEAR   Specific Gravity, Urine 1.024 1.005 - 1.030   pH 6.0 5.0 - 8.0   Glucose, UA NEGATIVE NEGATIVE mg/dL   Hgb urine dipstick NEGATIVE NEGATIVE   Bilirubin Urine NEGATIVE NEGATIVE   Ketones, ur NEGATIVE NEGATIVE mg/dL   Protein, ur NEGATIVE NEGATIVE mg/dL   Nitrite NEGATIVE NEGATIVE   Leukocytes,Ua SMALL (A) NEGATIVE   RBC / HPF 0-5 0 - 5 RBC/hpf   WBC, UA 0-5 0 - 5 WBC/hpf   Bacteria, UA RARE (A) NONE SEEN   Squamous Epithelial / HPF 6-10 0 - 5 /HPF   Mucus PRESENT   CBC     Status: None   Collection Time: 05/14/22  8:01 PM  Result Value Ref Range   WBC 9.0 4.0 - 10.5 K/uL   RBC 4.73 3.87 - 5.11 MIL/uL   Hemoglobin 13.1 12.0 - 15.0 g/dL   HCT 38.1 36.0 - 46.0 %   MCV 80.5 80.0 - 100.0 fL   MCH 27.7 26.0 - 34.0 pg   MCHC 34.4 30.0 - 36.0 g/dL   RDW 12.7 11.5 - 15.5 %   Platelets 321 150 - 400 K/uL   nRBC 0.0 0.0 - 0.2 %    MDM - EKG- NSR  - Cervix closed and positerior. Low suspicion for perterm labor.  - UA cloudy and positive for Leuka and bacteria but otherwise normal. Reflexed to culture.  - Wet prep positive for BV.  - Pain improved from 6-->4/10 with Tylenol and Flexeril.  - Plan for discharge.   Assessment and Plan   1. BV (bacterial vaginosis)   2. Pulled muscle   3. [redacted] weeks gestation of pregnancy    - Reviewed that abdominal pain likely caused by BV.  - Rx for Metronidazole sent to outpatient pharmacy for pick up.  - Recommended rest and good body mechanics for chest relief in the presence of a pulled muscle.  - Worsening signs and return precautions reviewed.  - Early pregnancy precautions reviewed   - Patient discharged home in stable condition and may return to MAU as needed.   Jacquiline Doe, MNS CNM  05/14/2022, 8:10 PM

## 2022-05-15 ENCOUNTER — Telehealth: Payer: Self-pay

## 2022-05-15 LAB — CULTURE, OB URINE

## 2022-05-15 LAB — GC/CHLAMYDIA PROBE AMP (~~LOC~~) NOT AT ARMC
Chlamydia: NEGATIVE
Comment: NEGATIVE
Comment: NORMAL
Neisseria Gonorrhea: NEGATIVE

## 2022-05-15 NOTE — Telephone Encounter (Signed)
Patient reports she was seen in ED last night and was given rx for BV. Inquiring if safe during pregnancy. Chart reviewed. Patient was rx'd metroNIDAZOLE (FLAGYL) 500 MG tablet. Advised safe in pregnancy per protocol.

## 2022-05-20 ENCOUNTER — Encounter (HOSPITAL_COMMUNITY): Payer: Self-pay | Admitting: Obstetrics & Gynecology

## 2022-05-20 ENCOUNTER — Inpatient Hospital Stay (HOSPITAL_BASED_OUTPATIENT_CLINIC_OR_DEPARTMENT_OTHER): Payer: Medicaid Other

## 2022-05-20 ENCOUNTER — Inpatient Hospital Stay (HOSPITAL_COMMUNITY)
Admission: AD | Admit: 2022-05-20 | Discharge: 2022-05-20 | Disposition: A | Discharge status: Home / Self Care | Payer: Medicaid Other | Attending: Obstetrics & Gynecology | Admitting: Obstetrics & Gynecology

## 2022-05-20 DIAGNOSIS — R109 Unspecified abdominal pain: Secondary | ICD-10-CM

## 2022-05-20 DIAGNOSIS — O9A212 Injury, poisoning and certain other consequences of external causes complicating pregnancy, second trimester: Secondary | ICD-10-CM

## 2022-05-20 DIAGNOSIS — Y9301 Activity, walking, marching and hiking: Secondary | ICD-10-CM | POA: Insufficient documentation

## 2022-05-20 DIAGNOSIS — Z3A16 16 weeks gestation of pregnancy: Secondary | ICD-10-CM | POA: Diagnosis not present

## 2022-05-20 DIAGNOSIS — O26892 Other specified pregnancy related conditions, second trimester: Secondary | ICD-10-CM

## 2022-05-20 DIAGNOSIS — R1084 Generalized abdominal pain: Secondary | ICD-10-CM | POA: Insufficient documentation

## 2022-05-20 DIAGNOSIS — Y9302 Activity, running: Secondary | ICD-10-CM | POA: Diagnosis not present

## 2022-05-20 DIAGNOSIS — W108XXA Fall (on) (from) other stairs and steps, initial encounter: Secondary | ICD-10-CM | POA: Diagnosis not present

## 2022-05-20 DIAGNOSIS — W19XXXA Unspecified fall, initial encounter: Secondary | ICD-10-CM

## 2022-05-20 MED ORDER — ACETAMINOPHEN 500 MG PO TABS
1000.0000 mg | ORAL_TABLET | Freq: Once | ORAL | Status: AC
  Administered 2022-05-20: 1000 mg via ORAL
  Filled 2022-05-20: qty 2

## 2022-05-20 NOTE — MAU Note (Addendum)
Miranda Curtis is a 22 y.o. at 12w3dhere in MAU reporting: been having cramping.  Was treated for BV.  The cramping has increased.  Fell last night, running up the stairs chasing her daughter, hit  abd.   This morning, it was hurting really, really bad. No bleeding. No bruising noted  Onset of complaint: last night Pain score: 7 Vitals:   05/20/22 1354  BP: 117/61  Pulse: 97  Resp: 18  Temp: 98.4 F (36.9 C)  SpO2: 100%     FHT:147 Lab orders placed from triage:  none

## 2022-05-20 NOTE — MAU Provider Note (Signed)
History     CSN: [id number]  Arrival date and time: 05/20/22 1319   Event Date/Time   First Provider Initiated Contact with Patient 05/20/22 1557      Chief Complaint  Patient presents with   Abdominal Pain   Fall   Miranda Curtis is a 22 y.o. [id number] at 27w3dwho presents today after a fall. She states last evening she was running up the stairs and fell directly onto her stomach. She denies any vaginal bleeding or LOF. She has had some abdominal pain since the fall. She reports that she has felt fetal movement since the fall as well.   Abdominal Pain This is a new problem. The current episode started today. The problem occurs constantly. The problem is unchanged. The pain is located in the generalized abdominal region. The pain is moderate. The quality of the pain is described as cramping.  Fall The accident occurred 12 to 24 hours ago. The fall occurred while walking. She fell from a height of 3 to 5 ft. She landed on Hard floor. There was no blood loss. Point of impact: abdomen. Pain location: abdomen. Associated symptoms include abdominal pain. She has tried nothing for the symptoms.    OB History     Gravida  4   Para  1   Term  1   Preterm      AB  2   Living  1      SAB  2   IAB      Ectopic      Multiple  0   Live Births  1           Past Medical History:  Diagnosis Date   Anxiety    Attention deficit hyperactivity disorder (ADHD)    Depression    Diabetes mellitus without complication (HCC)    states prior to gastric sleeve surgery-has resolved following surgery   Eczema     Past Surgical History:  Procedure Laterality Date   SLEEVE GASTROPLASTY  10/12/2021   UPPER GASTROINTESTINAL ENDOSCOPY N/A     Family History  Problem Relation Age of Onset   Diabetes Mother    Healthy Mother    Sickle cell trait Mother    Sickle cell trait Father    Rashes / Skin problems Father        eczema   Asthma Sister    Rashes / Skin problems Sister         eczema   Rashes / Skin problems Sister        eczema   Rashes / Skin problems Sister        eczema   Sickle cell anemia Sister    Rashes / Skin problems Sister        eczema   Autism Brother    Healthy Brother    Diabetes Maternal Grandmother    Hypertension Maternal Grandmother    Cancer Maternal Grandfather        prostate   Cancer Paternal Grandmother [phone]      lung   Healthy Paternal Grandfather    Ovarian cancer Neg Hx    Colon cancer Neg Hx    Breast cancer Neg Hx     Social History   Tobacco Use   Smoking status: Never   Smokeless tobacco: Never  Vaping Use   Vaping Use: Never used  Substance Use Topics   Alcohol use: No   Drug use: No    Allergies:  Allergies  Allergen Reactions   Metoclopramide Other (See Comments)    "Severe panic attack" "Severe panic attack" "Severe panic attack"    Medications Prior to Admission  Medication Sig Dispense Refill Last Dose   Doxylamine-Pyridoxine ER (BONJESTA) 20-20 MG TBCR Take 1 tablet by mouth 2 (two) times daily. 12 tablet 0    metroNIDAZOLE (FLAGYL) 500 MG tablet Take 1 tablet (500 mg total) by mouth 2 (two) times daily. 14 tablet 0    ondansetron (ZOFRAN-ODT) 4 MG disintegrating tablet Take 1 tablet (4 mg total) by mouth every 6 (six) hours as needed for nausea. 20 tablet 1    Prenatal Vit-Fe Fumarate-FA (PRENATAL VITAMIN PO) Take 1 tablet by mouth daily at 6 (six) AM.      Vitamin D, Ergocalciferol, (DRISDOL) 1.25 MG (50000 UNIT) CAPS capsule Take 1 capsule (50,000 Units total) by mouth every 7 (seven) days. 12 capsule 0     Review of Systems  Gastrointestinal:  Positive for abdominal pain.  All other systems reviewed and are negative.  Physical Exam   Blood pressure 117/61, pulse 97, temperature 98.4 F (36.9 C), temperature source Oral, resp. rate 18, height 5' (1.524 m), weight 81.2 kg, last menstrual period 01/25/2022, SpO2 100 %, unknown if currently breastfeeding.  Physical  Exam Constitutional:      Appearance: She is well-developed.  HENT:     Head: Normocephalic.  Eyes:     Pupils: Pupils are equal, round, and reactive to light.  Cardiovascular:     Rate and Rhythm: Normal rate.  Pulmonary:     Effort: Pulmonary effort is normal. No respiratory distress.  Abdominal:     Palpations: Abdomen is soft.     Tenderness: There is no abdominal tenderness.  Genitourinary:    Vagina: No bleeding. Vaginal discharge: mucusy.    Comments: External: no lesion Vagina: small amount of white discharge     Musculoskeletal:        General: Normal range of motion.     Cervical back: Normal range of motion.  Skin:    General: Skin is warm and dry.  Neurological:     Mental Status: She is alert and oriented to person, place, and time.  Psychiatric:        Mood and Affect: Mood normal.        Behavior: Behavior normal.     +FHT 147 with doppler   MFM [place]: Cephalic, anterior placenta, no previa, no abruption, cervix 5.1cm     MAU Course  Procedures  MDM   Assessment and Plan   1. Fall, initial encounter   2. [redacted] weeks gestation of pregnancy    DC home in stable condition  2nd Trimester precautions  PTL precautions  Fetal kick counts RX: none  Return to MAU as needed FU with OB as planned   Follow-up Information     Rubie Maid, MD Follow up.   Specialties: Obstetrics and Gynecology, Radiology Why: As scheduled Contact information: [place] [address] [place], [place]  05/20/22  4:47 PM

## 2022-05-23 ENCOUNTER — Telehealth: Payer: Self-pay

## 2022-05-23 NOTE — Telephone Encounter (Signed)
Pt calling; since starting flagyl has felt lightheaded and short of breath; actually did pass out in store today; knows she and her baby are okay but wanted to know what to do about it.  Adv to be sure she is eating protein and staying hydrated, not skipping meals.  Also adv pt to move slower esp with standing up and sitting as there is more blood to be pumped around and with quick movements it takes a little bit for the body to catch up.  Pt states that explains a lot.

## 2022-05-30 ENCOUNTER — Ambulatory Visit
Admission: RE | Admit: 2022-05-30 | Discharge: 2022-05-30 | Disposition: A | Discharge status: Home / Self Care | Payer: Medicaid Other | Source: Ambulatory Visit | Attending: Obstetrics and Gynecology | Admitting: Obstetrics and Gynecology

## 2022-05-30 DIAGNOSIS — O321XX0 Maternal care for breech presentation, not applicable or unspecified: Secondary | ICD-10-CM | POA: Insufficient documentation

## 2022-05-30 DIAGNOSIS — Z9884 Bariatric surgery status: Secondary | ICD-10-CM | POA: Diagnosis present

## 2022-05-30 DIAGNOSIS — Z3480 Encounter for supervision of other normal pregnancy, unspecified trimester: Secondary | ICD-10-CM

## 2022-05-30 DIAGNOSIS — Z369 Encounter for antenatal screening, unspecified: Secondary | ICD-10-CM | POA: Diagnosis not present

## 2022-05-30 DIAGNOSIS — Z3A17 17 weeks gestation of pregnancy: Secondary | ICD-10-CM | POA: Insufficient documentation

## 2022-05-31 ENCOUNTER — Ambulatory Visit (INDEPENDENT_AMBULATORY_CARE_PROVIDER_SITE_OTHER): Payer: Medicaid Other | Admitting: Advanced Practice Midwife

## 2022-05-31 ENCOUNTER — Encounter: Payer: Self-pay | Admitting: Advanced Practice Midwife

## 2022-05-31 VITALS — BP 106/71 | HR 98 | Wt 179.0 lb

## 2022-05-31 DIAGNOSIS — Z1329 Encounter for screening for other suspected endocrine disorder: Secondary | ICD-10-CM

## 2022-05-31 DIAGNOSIS — N930 Postcoital and contact bleeding: Secondary | ICD-10-CM

## 2022-05-31 DIAGNOSIS — Z3480 Encounter for supervision of other normal pregnancy, unspecified trimester: Secondary | ICD-10-CM

## 2022-05-31 DIAGNOSIS — Z3A18 18 weeks gestation of pregnancy: Secondary | ICD-10-CM | POA: Diagnosis not present

## 2022-05-31 DIAGNOSIS — O26899 Other specified pregnancy related conditions, unspecified trimester: Secondary | ICD-10-CM

## 2022-05-31 DIAGNOSIS — R109 Unspecified abdominal pain: Secondary | ICD-10-CM

## 2022-05-31 DIAGNOSIS — R55 Syncope and collapse: Secondary | ICD-10-CM

## 2022-05-31 DIAGNOSIS — Z9884 Bariatric surgery status: Secondary | ICD-10-CM

## 2022-05-31 LAB — POCT URINALYSIS DIPSTICK
Bilirubin, UA: NEGATIVE
Glucose, UA: NEGATIVE
Ketones, UA: NEGATIVE
Leukocytes, UA: NEGATIVE
Nitrite, UA: NEGATIVE
Protein, UA: NEGATIVE
Urobilinogen, UA: 0.2 E.U./dL
pH, UA: 6 (ref 5.0–8.0)

## 2022-05-31 MED ORDER — ASPIRIN 81 MG PO TBEC: 81.0000 mg | DELAYED_RELEASE_TABLET | Freq: Every day | ORAL | 2 refills | Status: DC

## 2022-05-31 NOTE — Progress Notes (Signed)
NOB transfer   Had U/S yesterday.  AFP today.  CC: pt still having sx's of BV no odor or itching. Just cramping. Just finished Rx for BV on 06/02/22.  Recurrent episodes of passing out, notes getting light headed a lot. Eats at least 3 meals or more a day.

## 2022-05-31 NOTE — Patient Instructions (Signed)

## 2022-05-31 NOTE — Progress Notes (Signed)
PRENATAL VISIT NOTE  Subjective:  Miranda Curtis is a 22 y.o. [id number] at 26w0dbeing seen today [facility]  She is currently monitored for the following issues for this low-risk pregnancy and has Maternal varicella, non-immune; Vitamin B 12 deficiency; History of depression; Missed abortion; Supervision of other normal pregnancy, antepartum; Obesity in pregnancy; History of diabetes mellitus; History of bariatric surgery; Vitamin D deficiency; and Obesity (BMI 30.0-34.9) on their problem list.  Patient reports  multiple ongoing complaints .  Contractions: Not present. Vag. Bleeding: None.  Movement: Present. Denies leaking of fluid.   Dizziness and Syncope This is a recurrent problem, onset in first trimester and coinciding with episodes of vomiting. Patient endorses frequent near-syncope but has experienced one true episode of syncope. For that episode she reports prolonged walking and standing, syncopal episode was witnessed, EMS was not called and patient did not present to hospital for evaluation. She denies chest pain, shortness of breath.  Abdominal Cramping Onset of complaint: first trimester. Pain is sometimes right mid-abdomen, sometimes suprapubic. She states she was told cramping can be normal. She is not experiencing abdominal tenderness, dysuria, flank pain or fever.  The following portions of the patient's history were reviewed and updated as appropriate: allergies, current medications, past family history, past medical history, past social history, past surgical history and problem list. Problem list updated.  Objective:   Vitals:   05/31/22 1319  BP: 106/71  Pulse: 98  Weight: 179 lb (81.2 kg)    Fetal Status: Fetal Heart Rate (bpm): 142   Movement: Present     General:  Alert, oriented and cooperative. Patient is in no acute distress.  Skin: Skin is warm and dry. No rash noted.   Cardiovascular: Normal heart rate noted   Respiratory: Normal respiratory effort, no problems with respiration noted  Abdomen: Soft, gravid, appropriate for gestational age.  Pain/Pressure: Absent     Pelvic: Cervical exam deferred        Extremities: Normal range of motion.  Edema: None  Mental Status: Normal mood and affect. Normal behavior. Normal judgment and thought content.   Assessment and Plan:  Pregnancy: G4P1021 at 160w0d1. Supervision of other normal pregnancy, antepartum - Welcome to practice! Excited to care for and support her - aspirin EC 81 MG tablet; Take 1 tablet (81 mg total) by mouth daily. Take after 12 weeks for prevention of preeclampsia later in pregnancy  Dispense: 300 tablet; Refill: 2  2. [redacted] weeks gestation of pregnancy  - POCT Urinalysis Dipstick - AFP, Serum, Open Spina Bifida - [place] OB FOLLOW UP; Future  3. Thyroid disorder screening - Confirmed with Dr. AnHarolyn Rutherfordfull Thyroid panel not indicated at this time - TSH Rfx on Abnormal to Free T4  4. Cramping affecting pregnancy, antepartum - Negative for abdominal tenderness, flank pain, CVAT, fever - Hematuria on urine dip in office - Culture, OB Urine  5. Vasovagal syncope - Discussed possible sources ranging from low PO intake to more acute cardiac cause - AS of 1:40pm patient has only eaten a bowel of cereal with almond milk - Advised to call ambulance for stat evaluation if she experiences another event. EMS crew can assess, collect vitals and collect ECG even if she elects to avoid transport to hospital - [place] to [place]. Postcoital and contact bleeding - Hx of postcoital spotting in early pregnancy,  - Intercourse yesterday, likely course of trace hemoglobin in today's  urine dip  7. Hx of bariatric surgery - Support her avoiding 2 hour GTT, reviewed options and can discuss more as that milestone approaches  Preterm labor symptoms and general obstetric precautions including but not limited to vaginal bleeding,  contractions, leaking of fluid and fetal movement were reviewed in detail with the patient. Please refer to After Visit Summary for other counseling recommendations.  Return in about 4 weeks (around 06/28/2022) for CNM please.  Future Appointments  Date Time Provider [place]  06/06/2022  8:55 AM Rubie Maid, MD [facility] None  06/28/2022 11:15 AM Darlina Rumpf, CNM [facility]  07/26/2022 10:55 AM Darlina Rumpf, CNM [facility]    Darlina Rumpf, [place]

## 2022-06-01 LAB — TSH RFX ON ABNORMAL TO FREE T4: TSH: 0.89 u[IU]/mL (ref 0.450–4.500)

## 2022-06-02 LAB — AFP, SERUM, OPEN SPINA BIFIDA
AFP MoM: 0.81
AFP Value: 35.3 ng/mL
Gest. Age on Collection Date: 18 weeks
Maternal Age At EDD: 23.1 yr
OSBR Risk 1 IN: 10000
Test Results:: NEGATIVE
Weight: 179 [lb_av]

## 2022-06-03 ENCOUNTER — Other Ambulatory Visit: Payer: Self-pay | Admitting: Obstetrics and Gynecology

## 2022-06-03 DIAGNOSIS — Z362 Encounter for other antenatal screening follow-up: Secondary | ICD-10-CM

## 2022-06-03 LAB — CULTURE, OB URINE

## 2022-06-03 LAB — URINE CULTURE, OB REFLEX

## 2022-06-06 ENCOUNTER — Encounter: Payer: Self-pay | Admitting: Advanced Practice Midwife

## 2022-06-06 ENCOUNTER — Encounter: Payer: Medicaid Other | Admitting: Obstetrics and Gynecology

## 2022-06-11 ENCOUNTER — Inpatient Hospital Stay (HOSPITAL_COMMUNITY)
Admission: AD | Admit: 2022-06-11 | Discharge: 2022-06-11 | Disposition: A | Discharge status: Home / Self Care | Payer: Medicaid Other | Attending: Family Medicine | Admitting: Family Medicine

## 2022-06-11 ENCOUNTER — Encounter (HOSPITAL_COMMUNITY): Payer: Self-pay | Admitting: Family Medicine

## 2022-06-11 DIAGNOSIS — R42 Dizziness and giddiness: Secondary | ICD-10-CM | POA: Diagnosis not present

## 2022-06-11 DIAGNOSIS — O24112 Pre-existing diabetes mellitus, type 2, in pregnancy, second trimester: Secondary | ICD-10-CM | POA: Diagnosis not present

## 2022-06-11 DIAGNOSIS — Z3A19 19 weeks gestation of pregnancy: Secondary | ICD-10-CM | POA: Diagnosis not present

## 2022-06-11 DIAGNOSIS — O26892 Other specified pregnancy related conditions, second trimester: Secondary | ICD-10-CM | POA: Insufficient documentation

## 2022-06-11 LAB — COMPREHENSIVE METABOLIC PANEL
ALT: 10 U/L (ref 0–44)
AST: 14 U/L — ABNORMAL LOW (ref 15–41)
Albumin: 3 g/dL — ABNORMAL LOW (ref 3.5–5.0)
Alkaline Phosphatase: 34 U/L — ABNORMAL LOW (ref 38–126)
Anion gap: 13 (ref 5–15)
BUN: 5 mg/dL — ABNORMAL LOW (ref 6–20)
CO2: 18 mmol/L — ABNORMAL LOW (ref 22–32)
Calcium: 9.2 mg/dL (ref 8.9–10.3)
Chloride: 104 mmol/L (ref 98–111)
Creatinine, Ser: 0.63 mg/dL (ref 0.44–1.00)
GFR, Estimated: >60 mL/min (ref >60)
Glucose, Bld: 75 mg/dL (ref 70–99)
Potassium: 3.9 mmol/L (ref 3.5–5.1)
Sodium: 135 mmol/L (ref 135–145)
Total Bilirubin: 0.6 mg/dL (ref 0.3–1.2)
Total Protein: 6.4 g/dL — ABNORMAL LOW (ref 6.5–8.1)

## 2022-06-11 LAB — CBC
HCT: 32.5 % — ABNORMAL LOW (ref 36.0–46.0)
Hemoglobin: 11.4 g/dL — ABNORMAL LOW (ref 12.0–15.0)
MCH: 27.5 pg (ref 26.0–34.0)
MCHC: 35.1 g/dL (ref 30.0–36.0)
MCV: 78.3 fL — ABNORMAL LOW (ref 80.0–100.0)
Platelets: 325 10*3/uL (ref 150–400)
RBC: 4.15 MIL/uL (ref 3.87–5.11)
RDW: 12.3 % (ref 11.5–15.5)
WBC: 10.7 10*3/uL — ABNORMAL HIGH (ref 4.0–10.5)
nRBC: 0 % (ref 0.0–0.2)

## 2022-06-11 LAB — URINALYSIS, ROUTINE W REFLEX MICROSCOPIC
Bilirubin Urine: NEGATIVE
Glucose, UA: NEGATIVE mg/dL
Hgb urine dipstick: NEGATIVE
Ketones, ur: NEGATIVE mg/dL
Leukocytes,Ua: NEGATIVE
Nitrite: NEGATIVE
Protein, ur: NEGATIVE mg/dL
Specific Gravity, Urine: 1.017 (ref 1.005–1.030)
pH: 6 (ref 5.0–8.0)

## 2022-06-11 NOTE — Discharge Instructions (Signed)

## 2022-06-11 NOTE — MAU Note (Signed)
.  Miranda Curtis is a 22 y.o. at 76w4dhere in MAU reporting: EMS Arrival. Pt c/o feeling dizzy and lightheaded since about 8am. Sh ehas had several episodes in this pregnancy of almost passing out and feeling dizzy. OB told her the next time she felt like that to come to the hospital.  LMP:  Onset of complaint: 8am Pain score: 0 Vitals:   06/11/22 1125  BP: 100/64  Pulse: 98  Temp: 97.9 F (36.6 C)     FHT: Lab orders placed from triage:   u/a

## 2022-06-11 NOTE — MAU Provider Note (Signed)
History     CSN: [id number]  Arrival date and time: 06/11/22 1113   Event Date/Time   First Provider Initiated Contact with Patient 06/11/22 1213      Chief Complaint  Patient presents with   Dizziness   HPI [name] is a 22 y.o. year old G31P1021 female at 65w4dweeks gestation who presents to MAU via EMS reporting feeling dizzy and lightheaded intermittently throughout pregnancy. Those sx's started this morning at 0800. She was eating breakfast (sausage biscuit and juice) when the sx's started today. CBG 120 on EMS per patient. She reports "passing out at a store on 05/23/2022" and was told by her OB the next time she felt like she was going to pass out or actually does pass out, to come to the hospital. [facility]. [facility]  OB History     Gravida  4   Para  1   Term  1   Preterm      AB  2   Living  1      SAB  2   IAB      Ectopic      Multiple  0   Live Births  1           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-has resolved following surgery   Eczema     Past Surgical History:  Procedure Laterality Date   SLEEVE GASTROPLASTY  10/12/2021   UPPER GASTROINTESTINAL ENDOSCOPY N/A     Family History  Problem Relation Age of Onset   Diabetes Mother    Healthy Mother    Sickle cell trait Mother    Sickle cell trait Father    Rashes / Skin problems Father        eczema   Asthma Sister    Rashes / Skin problems Sister        eczema   Rashes / Skin problems Sister        eczema   Rashes / Skin problems Sister        eczema   Sickle cell anemia Sister    Rashes / Skin problems Sister        eczema   Autism Brother    Healthy Brother    Diabetes  Maternal Grandmother    Hypertension Maternal Grandmother    Cancer Maternal Grandfather        prostate   Cancer Paternal Grandmother [phone]      lung   Healthy Paternal Grandfather    Ovarian cancer Neg Hx    Colon cancer Neg Hx    Breast cancer Neg Hx     Social History   Tobacco Use   Smoking status: Never   Smokeless tobacco: Never  Vaping Use   Vaping Use: Never used  Substance Use Topics   Alcohol use: No   Drug use: No    Allergies:  Allergies  Allergen Reactions   Metoclopramide Other (See Comments)    "Severe panic attack" "Severe panic attack" "Severe panic attack"    Medications Prior to Admission  Medication Sig Dispense Refill Last Dose   aspirin EC 81 MG tablet Take 1 tablet (81  mg total) by mouth daily. Take after 12 weeks for prevention of preeclampsia later in pregnancy 300 tablet 2 06/10/2022   Prenatal Vit-Fe Fumarate-FA (PRENATAL VITAMIN PO) Take 1 tablet by mouth daily at 6 (six) AM.   06/10/2022   Doxylamine-Pyridoxine ER (BONJESTA) 20-20 MG TBCR Take 1 tablet by mouth 2 (two) times daily. (Patient not taking: Reported on 05/31/2022) 12 tablet 0    metroNIDAZOLE (FLAGYL) 500 MG tablet Take 1 tablet (500 mg total) by mouth 2 (two) times daily. (Patient not taking: Reported on 05/31/2022) 14 tablet 0    ondansetron (ZOFRAN-ODT) 4 MG disintegrating tablet Take 1 tablet (4 mg total) by mouth every 6 (six) hours as needed for nausea. (Patient not taking: Reported on 05/31/2022) 20 tablet 1    Vitamin D, Ergocalciferol, (DRISDOL) 1.25 MG (50000 UNIT) CAPS capsule Take 1 capsule (50,000 Units total) by mouth every 7 (seven) days. (Patient not taking: Reported on 05/31/2022) 12 capsule 0     Review of Systems  Constitutional: Negative.   HENT: Negative.    Eyes: Negative.   Respiratory: Negative.    Cardiovascular: Negative.   Gastrointestinal: Negative.   Endocrine: Negative.   Genitourinary: Negative.   Musculoskeletal: Negative.   Skin: Negative.    Allergic/Immunologic: Negative.   Neurological:  Positive for dizziness, weakness and light-headedness.  Hematological: Negative.   Psychiatric/Behavioral: Negative.     Physical Exam   Blood pressure 100/64, pulse 98, temperature 97.9 F (36.6 C), temperature source Oral, resp. rate 18, last menstrual period 01/25/2022, unknown if currently breastfeeding.  Physical Exam Vitals and nursing note reviewed.  Constitutional:      Appearance: Normal appearance. She is obese.  Cardiovascular:     Rate and Rhythm: Normal rate.  Pulmonary:     Effort: Pulmonary effort is normal.  Abdominal:     Palpations: Abdomen is soft.  Genitourinary:    Comments: Not indicated Musculoskeletal:        General: Normal range of motion.  Skin:    General: Skin is warm and dry.  Neurological:     Mental Status: She is alert and oriented to person, place, and time.  Psychiatric:        Mood and Affect: Mood normal.        Behavior: Behavior normal.        Thought Content: Thought content normal.        Judgment: Judgment normal.    FHTs by doppler: 147 bpm  MAU Course  Procedures  MDM CCUA CBC CMP EKG -- Normal Sinus Rhythm; preliminary report to medical records  Results for orders placed or performed during the hospital encounter of 06/11/22 (from the past 24 hour(s))  Urinalysis, Routine w reflex microscopic -Urine, Clean Catch     Status: None   Collection Time: 06/11/22 11:59 AM  Result Value Ref Range   Color, Urine YELLOW YELLOW   APPearance CLEAR CLEAR   Specific Gravity, Urine 1.017 1.005 - 1.030   pH 6.0 5.0 - 8.0   Glucose, UA NEGATIVE NEGATIVE mg/dL   Hgb urine dipstick NEGATIVE NEGATIVE   Bilirubin Urine NEGATIVE NEGATIVE   Ketones, ur NEGATIVE NEGATIVE mg/dL   Protein, ur NEGATIVE NEGATIVE mg/dL   Nitrite NEGATIVE NEGATIVE   Leukocytes,Ua NEGATIVE NEGATIVE  CBC     Status: Abnormal   Collection Time: 06/11/22 12:11 PM  Result Value Ref Range   WBC 10.7 (H) 4.0 -  10.5 K/uL   RBC 4.15 3.87 - 5.11 MIL/uL   Hemoglobin  11.4 (L) 12.0 - 15.0 g/dL   HCT 32.5 (L) 36.0 - 46.0 %   MCV 78.3 (L) 80.0 - 100.0 fL   MCH 27.5 26.0 - 34.0 pg   MCHC 35.1 30.0 - 36.0 g/dL   RDW 12.3 11.5 - 15.5 %   Platelets 325 150 - 400 K/uL   nRBC 0.0 0.0 - 0.2 %  Comprehensive metabolic panel     Status: Abnormal   Collection Time: 06/11/22 12:11 PM  Result Value Ref Range   Sodium 135 135 - 145 mmol/L   Potassium 3.9 3.5 - 5.1 mmol/L   Chloride 104 98 - 111 mmol/L   CO2 18 (L) 22 - 32 mmol/L   Glucose, Bld 75 70 - 99 mg/dL   BUN 5 (L) 6 - 20 mg/dL   Creatinine, Ser 0.63 0.44 - 1.00 mg/dL   Calcium 9.2 8.9 - 10.3 mg/dL   Total Protein 6.4 (L) 6.5 - 8.1 g/dL   Albumin 3.0 (L) 3.5 - 5.0 g/dL   AST 14 (L) 15 - 41 U/L   ALT 10 0 - 44 U/L   Alkaline Phosphatase 34 (L) 38 - 126 U/L   Total Bilirubin 0.6 0.3 - 1.2 mg/dL   GFR, Estimated >60 >60 mL/min   Anion gap 13 5 - 15    Assessment and Plan  1. Dizziness - Referral to [facility] Cardiology scheduled for 3/22  2. Lightheadedness - Referral to [facility] Cardiology scheduled for 3/22  3. [redacted] weeks gestation of pregnancy  - Discharge patient - Keep scheduled appt with [place] on 3/21 and OB cardiology on 3/22 - Patient verbalized an understanding of the plan of care and agrees.    Laury Deep, CNM 06/11/2022, 12:14 PM

## 2022-06-26 NOTE — Progress Notes (Signed)
Cardio-Obstetrics Clinic  New Evaluation  Date:  07/01/2022   ID:  Miranda Curtis, DOB Feb 14, 2001, MRN [id number]  PCP:  Pcp, No   [facility] Providers Cardiologist:  None  Electrophysiologist:  None       Referring MD: Joannie Springs*   Chief Complaint: syncope  History of Present Illness:    Miranda Curtis is a 22 y.o. female [G4P1021] who is being seen today for the evaluation of syncope at the request of Miranda Curtis, C*.   Patient seen by Clayton Bibles on 05/31/22. Notes reviewed. Patient reported episode of syncope after prolonged standing. She is now referred to Cardiology for further evaluation.  Today, the patient is currently [redacted]w[redacted]d pregnant. States that she has been having episodes syncope/presyncope. Specifically, she states that on one occasion, she was standing in the store where she felt very dizzy and then suddenly passed out. Has had several other presyncopal events where she feels very dizzy most often with prolonged standing that improves with sitting and resting. Has some associated SOB and palpitations at that time. No chest pain, orthopnea, PND, or LE edema.    Prior CV Studies Reviewed: The following studies were reviewed today: No CV studies  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-has resolved following surgery   Eczema     Past Surgical History:  Procedure Laterality Date   SLEEVE GASTROPLASTY  10/12/2021   UPPER GASTROINTESTINAL ENDOSCOPY N/A       OB History     Gravida  4   Para  1   Term  1   Preterm      AB  2   Living  1      SAB  2   IAB      Ectopic      Multiple  0   Live Births  1               Current Medications: Current Meds  Medication Sig   aspirin EC 81 MG tablet Take 1 tablet (81  mg total) by mouth daily. Take after 12 weeks for prevention of preeclampsia later in pregnancy   diphenhydramine-acetaminophen (TYLENOL PM) 25-500 MG TABS tablet Take 1 tablet by mouth at bedtime as needed for up to 14 days.   Doxylamine-Pyridoxine ER (BONJESTA) 20-20 MG TBCR Take 1 tablet by mouth 2 (two) times daily.   ondansetron (ZOFRAN-ODT) 4 MG disintegrating tablet Take 1 tablet (4 mg total) by mouth every 6 (six) hours as needed for nausea.   Prenatal Vit-Fe Fumarate-FA (PRENATAL VITAMIN PO) Take 1 tablet by mouth daily at 6 (six) AM.   Vitamin D, Ergocalciferol, (DRISDOL) 1.25 MG (50000 UNIT) CAPS capsule Take 1 capsule (50,000 Units total) by mouth every 7 (seven) days.     Allergies:   Metoclopramide   Social History   Socioeconomic History   Marital status: Single    Spouse name: Not on file   Number of children: 1   Years of education: 13   Highest education level: Not on file  Occupational History   Occupation: student   Occupation: CNA  Tobacco Use   Smoking status: Never   Smokeless tobacco: Never  Vaping Use   Vaping Use: Never used  Substance and Sexual Activity   Alcohol use:  No   Drug use: No   Sexual activity: Yes    Partners: Male    Birth control/protection: None  Other Topics Concern   Not on file  Social History Narrative   Not on file   Social Determinants of Health   Financial Resource Strain: Low Risk  (03/19/2022)   Overall Financial Resource Strain (CARDIA)    Difficulty of Paying Living Expenses: Not hard at all  Food Insecurity: No Food Insecurity (03/19/2022)   Hunger Vital Sign    Worried About Running Out of Food in the Last Year: Never true    Ran Out of Food in the Last Year: Never true  Transportation Needs: No Transportation Needs (03/19/2022)   PRAPARE - Administrator, Civil Service (Medical): No    Lack of Transportation (Non-Medical): No  Physical Activity: Inactive (03/19/2022)   Exercise Vital Sign    Days of  Exercise per Week: 0 days    Minutes of Exercise per Session: 0 min  Stress: No Stress Concern Present (03/19/2022)   [employer] of Occupational Health - Occupational Stress Questionnaire    Feeling of Stress : Not at all  Social Connections: Unknown (03/19/2022)   Social Connection and Isolation Panel [NHANES]    Frequency of Communication with Friends and Family: More than three times a week    Frequency of Social Gatherings with Friends and Family: More than three times a week    Attends Religious Services: Never    Database administrator or Organizations: No    Attends Engineer, structural: Never    Marital Status: Not on file      Family History  Problem Relation Age of Onset   Diabetes Mother    Healthy Mother    Sickle cell trait Mother    Sickle cell trait Father    Rashes / Skin problems Father        eczema   Asthma Sister    Rashes / Skin problems Sister        eczema   Rashes / Skin problems Sister        eczema   Rashes / Skin problems Sister        eczema   Sickle cell anemia Sister    Rashes / Skin problems Sister        eczema   Autism Brother    Healthy Brother    Diabetes Maternal Grandmother    Hypertension Maternal Grandmother    Cancer Maternal Grandfather        prostate   Cancer Paternal Grandmother 54       lung   Healthy Paternal Grandfather    Ovarian cancer Neg Hx    Colon cancer Neg Hx    Breast cancer Neg Hx       ROS:   Please see the history of present illness.    All other systems reviewed and are negative.   Labs/EKG Reviewed:    EKG:   EKG not ordered today. ECG 06/11/22 with NSR  Recent Labs: 04/11/2022: Magnesium 1.7 05/31/2022: TSH 0.890 06/11/2022: ALT 10; BUN 5; Creatinine, Ser 0.63; Hemoglobin 11.4; Platelets 325; Potassium 3.9; Sodium 135   Recent Lipid Panel No results found for: "CHOL", "TRIG", "HDL", "CHOLHDL", "LDLCALC", "LDLDIRECT"  Physical Exam:    VS:  BP 102/76   Pulse 95   Ht 5' (1.524  m)   Wt 185 lb 4.8 oz (84.1 kg)   LMP 01/25/2022 (Exact Date)  SpO2 98%   BMI 36.19 kg/m     Wt Readings from Last 3 Encounters:  06/29/22 185 lb 4.8 oz (84.1 kg)  06/28/22 182 lb (82.6 kg)  05/31/22 179 lb (81.2 kg)     GEN:  Well nourished, well developed in no acute distress HEENT: Normal NECK: No JVD; No carotid bruits CARDIAC: RRR, 1/6 systolic flow murmur RESPIRATORY:  Clear to auscultation without rales, wheezing or rhonchi  ABDOMEN: Soft, non-tender, non-distended MUSCULOSKELETAL:  No edema; No deformity  SKIN: Warm and dry NEUROLOGIC:  Alert and oriented x 3 PSYCHIATRIC:  Normal affect    Risk Assessment/Risk Calculators:     ASSESSMENT & PLAN:    #Suspected Orthostatic Syncope In Pregnancy: -Common in pregnancy in the setting of decreased SVR and venous compression  -Will check zio and TTE for further evaluation to ensure no arrhythmias or structural abnormalities -Discussed importance of hydration, slow position changes and compression socks to improve venous return -Recommend small, frequent meals with liberalized salt intake -If feels faint, discussed importance of laying down and elevating her legs   Patient Instructions  Medication Instructions:   Your physician recommends that you continue on your current medications as directed. Please refer to the Current Medication list given to you today.  *If you need a refill on your cardiac medications before your next appointment, please call your pharmacy*    Testing/Procedures:  Your physician has requested that you have an OB echocardiogram. Echocardiography is a painless test that uses sound waves to create images of your heart. It provides your doctor with information about the size and shape of your heart and how well your heart's chambers and valves are working. This procedure takes approximately one hour. There are no restrictions for this procedure. Cardiac-OB patient: to be performed by [place] or  [place].    Please do NOT wear cologne, perfume, aftershave, or lotions (deodorant is allowed). Please arrive 15 minutes prior to your appointment time.    ZIO XT- Long Term Monitor Instructions  Your physician has requested you wear a ZIO patch monitor for 3 days.  This is a single patch monitor. [employer] supplies one patch monitor per enrollment. Additional stickers are not available. Please do not apply patch if you will be having a Nuclear Stress Test,  Echocardiogram, Cardiac CT, MRI, or Chest Xray during the period you would be wearing the  monitor. The patch cannot be worn during these tests. You cannot remove and re-apply the  ZIO XT patch monitor.  Your ZIO patch monitor will be mailed 3 day USPS to your address on file. It may take 3-5 days  to receive your monitor after you have been enrolled.  Once you have received your monitor, please review the enclosed instructions. Your monitor  has already been registered assigning a specific monitor serial # to you.  Billing and Patient Assistance Program Information  We have supplied [employer] with any of your insurance information on file for billing purposes. [employer] offers a sliding scale Patient Assistance Program for patients that do not have  insurance, or whose insurance does not completely cover the cost of the ZIO monitor.  You must apply for the Patient Assistance Program to qualify for this discounted rate.  To apply, please call [employer] at [phone], select option 4, select option 2, ask to apply for  Patient Assistance Program. Meredeth Ide will ask your household income, and how many people  are in your household. They will quote your out-of-pocket cost based on that  information.  [employer] will also be able to set up a 6-month, interest-free payment plan if needed.  Applying the monitor   Shave hair from upper left chest.  Hold abrader disc by orange tab. Rub abrader in 40 strokes over the upper left chest as   indicated in your monitor instructions.  Clean area with 4 enclosed alcohol pads. Let dry.  Apply patch as indicated in monitor instructions. Patch will be placed under collarbone on left  side of chest with arrow pointing upward.  Rub patch adhesive wings for 2 minutes. Remove white label marked "1". Remove the white  label marked "2". Rub patch adhesive wings for 2 additional minutes.  While looking in a mirror, press and release button in center of patch. A small green light will  flash 3-4 times. This will be your only indicator that the monitor has been turned on.  Do not shower for the first 24 hours. You may shower after the first 24 hours.  Press the button if you feel a symptom. You will hear a small click. Record Date, Time and  Symptom in the Patient Logbook.  When you are ready to remove the patch, follow instructions on the last 2 pages of Patient  Logbook. Stick patch monitor onto the last page of Patient Logbook.  Place Patient Logbook in the blue and white box. Use locking tab on box and tape box closed  securely. The blue and white box has prepaid postage on it. Please place it in the mailbox as  soon as possible. Your physician should have your test results approximately 7 days after the  monitor has been mailed back to [facility].  Call [facility] Customer Care at [phone] if you have questions regarding  your ZIO XT patch monitor. Call them immediately if you see an orange light blinking on your  monitor.  If your monitor falls off in less than 4 days, contact our Monitor department at [phone].  If your monitor becomes loose or falls off after 4 days call [employer] at [phone] for  suggestions on securing your monitor    Follow-Up:  10 WEEKS WITH DR. Shari Prows HERE AT [facility]    Dispo:  No follow-ups on file.   Medication Adjustments/Labs and Tests Ordered: Current medicines are reviewed at length with the patient today.  Concerns  regarding medicines are outlined above.  Tests Ordered: Orders Placed This Encounter  Procedures   LONG TERM MONITOR (3-14 DAYS)   ECHOCARDIOGRAM COMPLETE   Medication Changes: No orders of the defined types were placed in this encounter.

## 2022-06-28 ENCOUNTER — Ambulatory Visit (INDEPENDENT_AMBULATORY_CARE_PROVIDER_SITE_OTHER): Payer: Medicaid Other | Admitting: Advanced Practice Midwife

## 2022-06-28 VITALS — BP 109/72 | HR 94 | Wt 182.0 lb

## 2022-06-28 DIAGNOSIS — Z8639 Personal history of other endocrine, nutritional and metabolic disease: Secondary | ICD-10-CM

## 2022-06-28 DIAGNOSIS — Z3482 Encounter for supervision of other normal pregnancy, second trimester: Secondary | ICD-10-CM

## 2022-06-28 DIAGNOSIS — E559 Vitamin D deficiency, unspecified: Secondary | ICD-10-CM

## 2022-06-28 DIAGNOSIS — R55 Syncope and collapse: Secondary | ICD-10-CM

## 2022-06-28 DIAGNOSIS — Z3A22 22 weeks gestation of pregnancy: Secondary | ICD-10-CM

## 2022-06-28 DIAGNOSIS — Z9884 Bariatric surgery status: Secondary | ICD-10-CM

## 2022-06-28 DIAGNOSIS — Z3480 Encounter for supervision of other normal pregnancy, unspecified trimester: Secondary | ICD-10-CM

## 2022-06-28 MED ORDER — DIPHENHYDRAMINE-APAP (SLEEP) 25-500 MG PO TABS: 1.0000 | ORAL_TABLET | Freq: Every evening | ORAL | 0 refills | Status: DC | PRN

## 2022-06-28 MED ORDER — VITAMIN D (ERGOCALCIFEROL) 1.25 MG (50000 UNIT) PO CAPS: 50000.0000 [IU] | ORAL_CAPSULE | ORAL | 0 refills | Status: DC

## 2022-06-28 NOTE — Progress Notes (Signed)
   PRENATAL VISIT NOTE  Subjective:  Miranda Curtis is a 22 y.o. G4P1021 at [redacted]w[redacted]d being seen today for ongoing prenatal care.  She is currently monitored for the following issues for this low-risk pregnancy and has Maternal varicella, non-immune; Vitamin B 12 deficiency; History of depression; Missed abortion; Supervision of other normal pregnancy, antepartum; Obesity in pregnancy; History of diabetes mellitus; History of bariatric surgery; Vitamin D deficiency; and Obesity (BMI 30.0-34.9) on their problem list.  Patient reports  ongoing fatigue and occasional dizziness. No episodes of syncope since last 05/23/2022 .  Contractions: Not present. Vag. Bleeding: None.  Movement: Present. Denies leaking of fluid.   The following portions of the patient's history were reviewed and updated as appropriate: allergies, current medications, past family history, past medical history, past social history, past surgical history and problem list. Problem list updated.  Objective:   Vitals:   06/28/22 1122  BP: 109/72  Pulse: 94  Weight: 182 lb (82.6 kg)    Fetal Status: Fetal Heart Rate (bpm): 154   Movement: Present     General:  Alert, oriented and cooperative. Patient is in no acute distress.  Skin: Skin is warm and dry. No rash noted.   Cardiovascular: Normal heart rate noted  Respiratory: Normal respiratory effort, no problems with respiration noted  Abdomen: Soft, gravid, appropriate for gestational age.  Pain/Pressure: Absent     Pelvic: Cervical exam deferred        Extremities: Normal range of motion.     Mental Status: Normal mood and affect. Normal behavior. Normal judgment and thought content.   Assessment and Plan:  Pregnancy: G4P1021 at [redacted]w[redacted]d  1. Supervision of other normal pregnancy, antepartum - Doing so well!  - Trial Tylenol PM for interrupted sleep  2. History of bariatric surgery - MFM serial evaluation already scheduled  3. Vitamin D deficiency - Previously prescribed PO  Vitamin D prior to transferring to [place] - Patient declines Vitamin D injection, would like to trial impact of PO first  4. History of diabetes mellitus   5. Syncope, unspecified syncope type - Predates pregnancy, first noted "during weight loss journey", unknown cause - First visit with Ob Cards is tomorrow!  Preterm labor symptoms and general obstetric precautions including but not limited to vaginal bleeding, contractions, leaking of fluid and fetal movement were reviewed in detail with the patient. Please refer to After Visit Summary for other counseling recommendations.    Future Appointments  Date Time Provider [place]  06/29/2022  3:40 PM Freada Bergeron, MD [facility] None  07/16/2022  8:15 AM [facility] NURSE [facility]  07/16/2022  8:30 AM [facility] US3 [facility]  07/26/2022 10:55 AM Darlina Rumpf, CNM [facility]  08/09/2022  9:00 AM [facility] LAB [facility]  08/09/2022 10:35 AM Darlina Rumpf, CNM [facility]  08/23/2022 10:55 AM Darlina Rumpf, CNM [facility]  09/06/2022 11:15 AM Darlina Rumpf, CNM [facility]    Darlina Rumpf, CNM

## 2022-06-29 ENCOUNTER — Ambulatory Visit: Payer: Medicaid Other | Attending: Cardiology

## 2022-06-29 ENCOUNTER — Ambulatory Visit (INDEPENDENT_AMBULATORY_CARE_PROVIDER_SITE_OTHER): Payer: Medicaid Other | Admitting: Cardiology

## 2022-06-29 ENCOUNTER — Encounter: Payer: Self-pay | Admitting: Cardiology

## 2022-06-29 VITALS — BP 102/76 | HR 95 | Ht 60.0 in | Wt 185.3 lb

## 2022-06-29 DIAGNOSIS — R55 Syncope and collapse: Secondary | ICD-10-CM | POA: Diagnosis not present

## 2022-06-29 NOTE — Progress Notes (Unsigned)
Enrolled patient for a 3 day Zio XT monitor to be mailed to patients home  07/02/22 Shipping address changed to [address], [address], [place], [place] [address]

## 2022-06-29 NOTE — Patient Instructions (Signed)
Medication Instructions:   Your physician recommends that you continue on your current medications as directed. Please refer to the Current Medication list given to you today.  *If you need a refill on your cardiac medications before your next appointment, please call your pharmacy*    Testing/Procedures:  Your physician has requested that you have an OB echocardiogram. Echocardiography is a painless test that uses sound waves to create images of your heart. It provides your doctor with information about the size and shape of your heart and how well your heart's chambers and valves are working. This procedure takes approximately one hour. There are no restrictions for this procedure. Cardiac-OB patient: to be performed by [facility] or [facility].    Please do NOT wear cologne, perfume, aftershave, or lotions (deodorant is allowed). Please arrive 15 minutes prior to your appointment time.    ZIO XT- Long Term Monitor Instructions  Your physician has requested you wear a ZIO patch monitor for 3 days.  This is a single patch monitor. [employer] supplies one patch monitor per enrollment. Additional stickers are not available. Please do not apply patch if you will be having a Nuclear Stress Test,  Echocardiogram, Cardiac CT, MRI, or Chest Xray during the period you would be wearing the  monitor. The patch cannot be worn during these tests. You cannot remove and re-apply the  ZIO XT patch monitor.  Your ZIO patch monitor will be mailed 3 day USPS to your address on file. It may take 3-5 days  to receive your monitor after you have been enrolled.  Once you have received your monitor, please review the enclosed instructions. Your monitor  has already been registered assigning a specific monitor serial # to you.  Billing and Patient Assistance Program Information  We have supplied [employer] with any of your insurance information on file for billing purposes. [employer] offers a sliding scale Patient  Assistance Program for patients that do not have  insurance, or whose insurance does not completely cover the cost of the ZIO monitor.  You must apply for the Patient Assistance Program to qualify for this discounted rate.  To apply, please call [employer] at [phone], select option 4, select option 2, ask to apply for  Patient Assistance Program. Theodore Demark will ask your household income, and how many people  are in your household. They will quote your out-of-pocket cost based on that information.  [employer] will also be able to set up a 27-month, interest-free payment plan if needed.  Applying the monitor   Shave hair from upper left chest.  Hold abrader disc by orange tab. Rub abrader in 40 strokes over the upper left chest as  indicated in your monitor instructions.  Clean area with 4 enclosed alcohol pads. Let dry.  Apply patch as indicated in monitor instructions. Patch will be placed under collarbone on left  side of chest with arrow pointing upward.  Rub patch adhesive wings for 2 minutes. Remove white label marked "1". Remove the white  label marked "2". Rub patch adhesive wings for 2 additional minutes.  While looking in a mirror, press and release button in center of patch. A small green light will  flash 3-4 times. This will be your only indicator that the monitor has been turned on.  Do not shower for the first 24 hours. You may shower after the first 24 hours.  Press the button if you feel a symptom. You will hear a small click. Record Date, Time and  Symptom in  the Patient Logbook.  When you are ready to remove the patch, follow instructions on the last 2 pages of Patient  Logbook. Stick patch monitor onto the last page of Patient Logbook.  Place Patient Logbook in the blue and white box. Use locking tab on box and tape box closed  securely. The blue and white box has prepaid postage on it. Please place it in the mailbox as  soon as possible. Your physician should have your test  results approximately 7 days after the  monitor has been mailed back to [facility].  Call [place] at [phone] if you have questions regarding  your ZIO XT patch monitor. Call them immediately if you see an orange light blinking on your  monitor.  If your monitor falls off in less than 4 days, contact our Monitor department at [phone].  If your monitor becomes loose or falls off after 4 days call [employer] at [phone] for  suggestions on securing your monitor    Follow-Up:  [place] HERE AT [facility]

## 2022-07-02 ENCOUNTER — Encounter (HOSPITAL_COMMUNITY): Payer: Self-pay

## 2022-07-04 ENCOUNTER — Encounter (HOSPITAL_COMMUNITY): Payer: Self-pay

## 2022-07-06 ENCOUNTER — Ambulatory Visit (HOSPITAL_COMMUNITY): Payer: Medicaid Other

## 2022-07-16 ENCOUNTER — Ambulatory Visit: Payer: Medicaid Other | Admitting: *Deleted

## 2022-07-16 ENCOUNTER — Ambulatory Visit: Payer: Medicaid Other | Attending: Advanced Practice Midwife

## 2022-07-16 ENCOUNTER — Encounter: Payer: Self-pay | Admitting: *Deleted

## 2022-07-16 ENCOUNTER — Other Ambulatory Visit: Payer: Self-pay | Admitting: *Deleted

## 2022-07-16 VITALS — BP 112/68 | HR 111

## 2022-07-16 DIAGNOSIS — Z3689 Encounter for other specified antenatal screening: Secondary | ICD-10-CM

## 2022-07-16 DIAGNOSIS — Z9884 Bariatric surgery status: Secondary | ICD-10-CM

## 2022-07-16 DIAGNOSIS — Z3A24 24 weeks gestation of pregnancy: Secondary | ICD-10-CM | POA: Insufficient documentation

## 2022-07-16 DIAGNOSIS — O99212 Obesity complicating pregnancy, second trimester: Secondary | ICD-10-CM

## 2022-07-16 DIAGNOSIS — E669 Obesity, unspecified: Secondary | ICD-10-CM | POA: Diagnosis not present

## 2022-07-16 DIAGNOSIS — Z363 Encounter for antenatal screening for malformations: Secondary | ICD-10-CM | POA: Diagnosis not present

## 2022-07-16 DIAGNOSIS — Z3A18 18 weeks gestation of pregnancy: Secondary | ICD-10-CM | POA: Diagnosis not present

## 2022-07-16 DIAGNOSIS — Z362 Encounter for other antenatal screening follow-up: Secondary | ICD-10-CM

## 2022-07-16 DIAGNOSIS — Z3480 Encounter for supervision of other normal pregnancy, unspecified trimester: Secondary | ICD-10-CM | POA: Diagnosis not present

## 2022-07-26 ENCOUNTER — Ambulatory Visit (INDEPENDENT_AMBULATORY_CARE_PROVIDER_SITE_OTHER): Payer: Medicaid Other | Admitting: Advanced Practice Midwife

## 2022-07-26 VITALS — BP 107/73 | HR 96 | Wt 185.0 lb

## 2022-07-26 DIAGNOSIS — Z9884 Bariatric surgery status: Secondary | ICD-10-CM

## 2022-07-26 DIAGNOSIS — Z3480 Encounter for supervision of other normal pregnancy, unspecified trimester: Secondary | ICD-10-CM

## 2022-07-26 DIAGNOSIS — Z3A26 26 weeks gestation of pregnancy: Secondary | ICD-10-CM

## 2022-07-26 DIAGNOSIS — E669 Obesity, unspecified: Secondary | ICD-10-CM

## 2022-07-26 DIAGNOSIS — R55 Syncope and collapse: Secondary | ICD-10-CM

## 2022-07-26 NOTE — Progress Notes (Signed)
   PRENATAL VISIT NOTE  Subjective:  Miranda Curtis is a 22 y.o. G4P1021 at [redacted]w[redacted]d being seen today for ongoing prenatal care.  She is currently monitored for the following issues for this high-risk pregnancy and has Maternal varicella, non-immune; Vitamin B 12 deficiency; History of depression; Missed abortion; Supervision of other normal pregnancy, antepartum; Obesity in pregnancy; History of diabetes mellitus; History of bariatric surgery; Vitamin D deficiency; and Obesity (BMI 30.0-34.9) on their problem list.  Patient reports no complaints.  Contractions: Not present. Vag. Bleeding: None.  Movement: Present. Denies leaking of fluid.   Patient has a job!!! Working from home, starts at the end of the month. Feeling excited after lengthy hunt for pregnancy-related openings and tiring interviews.  The following portions of the patient's history were reviewed and updated as appropriate: allergies, current medications, past family history, past medical history, past social history, past surgical history and problem list. Problem list updated.  Objective:   Vitals:   07/26/22 1058  BP: 107/73  Pulse: 96  Weight: 185 lb (83.9 kg)    Fetal Status: Fetal Heart Rate (bpm): 156 Fundal Height: 27 cm Movement: Present     General:  Alert, oriented and cooperative. Patient is in no acute distress.  Skin: Skin is warm and dry. No rash noted.   Cardiovascular: Normal heart rate noted  Respiratory: Normal respiratory effort, no problems with respiration noted  Abdomen: Soft, gravid, appropriate for gestational age.  Pain/Pressure: Absent     Pelvic: Cervical exam deferred        Extremities: Normal range of motion.  Edema: None  Mental Status: Normal mood and affect. Normal behavior. Normal judgment and thought content.   Assessment and Plan:  Pregnancy: G4P1021 at [redacted]w[redacted]d  1. Supervision of other normal pregnancy, antepartum - DOING AWESOME!!!  2. History of bariatric surgery - Strongly  considering non-fasting 1 hour GTT - S/p normal early HgbA1C at previous clinic  3. Syncope, unspecified syncope type - No recent episodes - S/p evaluation by Cardio-OB team  4. Obesity (BMI 30.0-34.9) - Weight stable, doing so well  5. [redacted] weeks gestation of pregnancy   Preterm labor symptoms and general obstetric precautions including but not limited to vaginal bleeding, contractions, leaking of fluid and fetal movement were reviewed in detail with the patient. Please refer to After Visit Summary for other counseling recommendations.   Future Appointments  Date Time Provider Department Center  08/01/2022  3:05 PM MC-CV [facility] ECHO 5 [facility]  08/09/2022  9:00 AM [facility] LAB [facility]  08/09/2022 10:35 AM Calvert Cantor, CNM [facility]  08/14/2022  8:30 AM [facility] NURSE [facility]  08/14/2022  8:45 AM [facility] US5 [facility]  08/23/2022 10:55 AM Calvert Cantor, CNM [facility]  09/06/2022 11:15 AM Calvert Cantor, CNM [facility]  09/10/2022  9:15 AM [facility] NURSE [facility]  09/10/2022  9:30 AM [facility] US3 [facility]US [facility]  09/20/2022 10:15 AM Calvert Cantor, CNM [facility]  09/21/2022  3:40 PM Shari Prows, Kathlynn Grate, MD CVD-WMC None  10/04/2022  9:35 AM Anyanwu, Jethro Bastos, MD [facility]    Calvert Cantor, CNM

## 2022-07-31 ENCOUNTER — Encounter (HOSPITAL_COMMUNITY): Payer: Self-pay

## 2022-07-31 ENCOUNTER — Encounter: Payer: Self-pay | Admitting: Advanced Practice Midwife

## 2022-08-01 ENCOUNTER — Ambulatory Visit (HOSPITAL_COMMUNITY): Payer: Medicaid Other

## 2022-08-03 ENCOUNTER — Inpatient Hospital Stay (HOSPITAL_COMMUNITY): Payer: Medicaid Other

## 2022-08-03 ENCOUNTER — Encounter (HOSPITAL_COMMUNITY): Payer: Self-pay | Admitting: Obstetrics and Gynecology

## 2022-08-03 ENCOUNTER — Inpatient Hospital Stay (HOSPITAL_COMMUNITY)
Admission: AD | Admit: 2022-08-03 | Discharge: 2022-08-03 | Disposition: A | Discharge status: Home / Self Care | Payer: Medicaid Other | Attending: Obstetrics and Gynecology | Admitting: Obstetrics and Gynecology

## 2022-08-03 DIAGNOSIS — R519 Headache, unspecified: Secondary | ICD-10-CM | POA: Diagnosis not present

## 2022-08-03 DIAGNOSIS — O24112 Pre-existing diabetes mellitus, type 2, in pregnancy, second trimester: Secondary | ICD-10-CM | POA: Insufficient documentation

## 2022-08-03 DIAGNOSIS — O26892 Other specified pregnancy related conditions, second trimester: Secondary | ICD-10-CM

## 2022-08-03 DIAGNOSIS — Z3A27 27 weeks gestation of pregnancy: Secondary | ICD-10-CM | POA: Insufficient documentation

## 2022-08-03 DIAGNOSIS — Z3689 Encounter for other specified antenatal screening: Secondary | ICD-10-CM

## 2022-08-03 LAB — URINALYSIS, ROUTINE W REFLEX MICROSCOPIC
Bacteria, UA: NONE SEEN
Glucose, UA: NEGATIVE mg/dL
Hgb urine dipstick: NEGATIVE
Ketones, ur: NEGATIVE mg/dL
Nitrite: NEGATIVE
Protein, ur: 30 mg/dL — AB
Specific Gravity, Urine: 1.025 (ref 1.005–1.030)
pH: 7 (ref 5.0–8.0)

## 2022-08-03 MED ORDER — DIPHENHYDRAMINE HCL 50 MG/ML IJ SOLN
25.0000 mg | Freq: Once | INTRAMUSCULAR | Status: AC
  Administered 2022-08-03: 25 mg via INTRAVENOUS
  Filled 2022-08-03: qty 1

## 2022-08-03 MED ORDER — DEXAMETHASONE SODIUM PHOSPHATE 10 MG/ML IJ SOLN
10.0000 mg | Freq: Once | INTRAMUSCULAR | Status: AC
  Administered 2022-08-03: 10 mg via INTRAVENOUS
  Filled 2022-08-03: qty 1

## 2022-08-03 MED ORDER — SODIUM CHLORIDE 0.9 % IV SOLN
25.0000 mg | Freq: Once | INTRAVENOUS | Status: AC
  Administered 2022-08-03: 25 mg via INTRAVENOUS
  Filled 2022-08-03: qty 1

## 2022-08-03 MED ORDER — LACTATED RINGERS IV BOLUS
1000.0000 mL | Freq: Once | INTRAVENOUS | Status: AC
  Administered 2022-08-03: 1000 mL via INTRAVENOUS

## 2022-08-03 MED ORDER — ACETAMINOPHEN-CAFFEINE 500-65 MG PO TABS
2.0000 | ORAL_TABLET | Freq: Once | ORAL | Status: AC
  Administered 2022-08-03: 2 via ORAL
  Filled 2022-08-03: qty 2

## 2022-08-03 NOTE — MAU Provider Note (Addendum)
History   Chief Complaint  Patient presents with   Headache   Abdominal Pain   Headache  Associated symptoms include abdominal pain, back pain, dizziness, nausea and vomiting.  Abdominal Pain Associated symptoms include headaches, nausea and vomiting. Pertinent negatives include no hematuria.   Miranda Curtis is a 22 y/o at [redacted]w[redacted]d [id number] presenting with HA and lower abdominal pain. Patient reports both HA and abdominal pain started this past Monday. She reports her HA has been constant in the anterior forehead region bilaterally. She said has taken tylenol 500 mg q12 with some missed doses since Monday. She reports her last dose was at 2140 yesterday evening. She said the tylenol has helped a little but the headache returns. She also endorses rest helps some but the HA still comes back after resting. She endorses standing make the HA worse. She reports drinking water and eating 3 meals with snacks per day. She describes her pain as a 6.5/10 currently.  Patient reports her abdominal pain started on Monday as a sharp pain relieved by tylenol. She said the pain is better now and describes the pain as dull. She locates the pain to her lower abdominal area bilaterally. She describes current pain as 3-3.5/10.  Patient denies vision changes. She describes sometimes seeing floaters when she does not have her glasses on for awhile. She endorses feelings of dizziness, light-headedness, CP when dizzy and lightheaded and feeling "winded."   Patient reports +FM, no VB and no LOF. She endorses increased vaginal discharge and describes the discharge as clear to milky white with liquid consistency. She is unsure if she had a contraction on Monday as her last pregnancy was 5 years ago.  OB History     Gravida  4   Para  1   Term  1   Preterm      AB  2   Living  1      SAB  2   IAB      Ectopic      Multiple  0   Live Births  1          Past Medical History:  Diagnosis Date    Anovulation 06/08/2020   Anxiety    Attention deficit hyperactivity disorder (ADHD)    Depression    Diabetes mellitus without complication (HCC)    states prior to gastric sleeve surgery-has resolved following surgery   Eczema    Past Surgical History:  Procedure Laterality Date   SLEEVE GASTROPLASTY  10/12/2021   UPPER GASTROINTESTINAL ENDOSCOPY N/A    Family History  Problem Relation Age of Onset   Diabetes Mother    Healthy Mother    Sickle cell trait Mother    Sickle cell trait Father    Rashes / Skin problems Father        eczema   Asthma Sister    Rashes / Skin problems Sister        eczema   Rashes / Skin problems Sister        eczema   Rashes / Skin problems Sister        eczema   Sickle cell anemia Sister    Rashes / Skin problems Sister        eczema   Autism Brother    Healthy Brother    Diabetes Maternal Grandmother    Hypertension Maternal Grandmother    Cancer Maternal Grandfather        prostate   Cancer Paternal Grandmother 16  lung   Healthy Paternal Grandfather    Ovarian cancer Neg Hx    Colon cancer Neg Hx    Breast cancer Neg Hx    Social History   Tobacco Use   Smoking status: Never   Smokeless tobacco: Never  Vaping Use   Vaping Use: Never used  Substance Use Topics   Alcohol use: No   Drug use: No   Allergies:  Allergies  Allergen Reactions   Metoclopramide Other (See Comments)    "Severe panic attack" "Severe panic attack" "Severe panic attack"   Medications Prior to Admission  Medication Sig Dispense Refill Last Dose   aspirin EC 81 MG tablet Take 1 tablet (81 mg total) by mouth daily. Take after 12 weeks for prevention of preeclampsia later in pregnancy 300 tablet 2 08/03/2022   Prenatal Vit-Fe Fumarate-FA (PRENATAL VITAMIN PO) Take 1 tablet by mouth daily at 6 (six) AM.   08/03/2022   Vitamin D, Ergocalciferol, (DRISDOL) 1.25 MG (50000 UNIT) CAPS capsule Take 1 capsule (50,000 Units total) by mouth every 7 (seven)  days. 12 capsule 0 08/03/2022   diphenhydramine-acetaminophen (TYLENOL PM) 25-500 MG TABS tablet Take 1 tablet by mouth at bedtime as needed for up to 14 days. 14 tablet 0    Doxylamine-Pyridoxine ER (BONJESTA) 20-20 MG TBCR Take 1 tablet by mouth 2 (two) times daily. (Patient not taking: Reported on 07/16/2022) 12 tablet 0    ondansetron (ZOFRAN-ODT) 4 MG disintegrating tablet Take 1 tablet (4 mg total) by mouth every 6 (six) hours as needed for nausea. (Patient not taking: Reported on 07/16/2022) 20 tablet 1    Review of Systems  Respiratory:  Positive for shortness of breath.        Patient describes feeling "winded"  Cardiovascular:  Positive for chest pain.       Patient reports CP only when she feels dizzy or lightheaded  Gastrointestinal:  Positive for abdominal pain, nausea and vomiting. Negative for blood in stool.       Patient reports vomiting this morning  Genitourinary:  Positive for vaginal discharge. Negative for hematuria and vaginal bleeding.  Musculoskeletal:  Positive for back pain.  Neurological:  Positive for dizziness, light-headedness and headaches.   Physical Exam Blood pressure 117/61, pulse (!) 107, temperature 98.3 F (36.8 C), temperature source Oral, resp. rate 15, height 5\' 1"  (1.549 m), weight 84.9 kg, last menstrual period 01/25/2022, SpO2 100 %, unknown if currently breastfeeding. Physical Exam Constitutional:      Appearance: She is well-developed.  HENT:     Head: Normocephalic and atraumatic.  Cardiovascular:     Rate and Rhythm: Regular rhythm. Tachycardia present.     Heart sounds: Normal heart sounds.  Pulmonary:     Effort: Pulmonary effort is normal.     Breath sounds: Normal breath sounds.  Abdominal:     General: Bowel sounds are normal.     Palpations: Abdomen is soft.  Musculoskeletal:        General: Normal range of motion.     Right lower leg: No edema.     Left lower leg: No edema.  Neurological:     Mental Status: She is alert.   Psychiatric:        Behavior: Behavior normal.   MAU Course Procedures  MDM - FHR: 140 bpm  Moderate variability  + Accels  None decels  - UA: Appearance: Cloudy; Bilirubin Urine: Small; Leukocytes: Trace; Protein: 30 - Urine Culture: Pending   Patient's headache consistent with  tension headache. Excedrin tension headache given with minimal relief. Given patient's minimal relief, decadron, benadryl, phenergan and IV LR were given.   2104: Patient has had tylenol/caffeine, decadron, benadryl, phenergan and LR bolus. Headache has not resolved. Will send patient for CT scan of head.   2130: Care turned over to [facility], CNM   Thressa Sheller DNP, CNM  08/03/22  9:20 PM    A&P Headache in pregnancy Abdominal pain in pregnancy 27 weeks of gestation in pregnancy  Reassessment (10:48 PM) Results return without significant findings. Provider to bedside to discuss. Patient informed that UA also without significance. Plan to send in Neuro referral. Recommend Magnesium supplement daily. No questions. Discharge to home in stable condition.   Cherre Robins MSN, CNM Advanced Practice Provider, Center for [employer]

## 2022-08-03 NOTE — MAU Note (Signed)
...  Jaeden Westbay is a 22 y.o. at [redacted]w[redacted]d here in MAU reporting: HA and intermittent abdominal pain since this past Monday. She reports she has been taking Tylenol which has helped her abdominal pain but not her HA. She reports initially her abdominal pain was sharp and is now a dull pain. She reports her HA is anterior and in the headband region. Denies VB or LOF. +FM.  Last took Tylenol last night prior to bed. Eating regularly. Sleeps from 2100-0750. Has not dealt with much stress lately. Did not experience HA's prior to pregnancy.  Next OB appointment on 5/2.  Onset of complaint: 4/22 Pain score:  5/10 HA 3/10 abdomen  FHT: 162 initial external Lab orders placed from triage:  UA

## 2022-08-04 LAB — CULTURE, OB URINE

## 2022-08-09 ENCOUNTER — Ambulatory Visit (INDEPENDENT_AMBULATORY_CARE_PROVIDER_SITE_OTHER): Payer: Medicaid Other | Admitting: Advanced Practice Midwife

## 2022-08-09 ENCOUNTER — Other Ambulatory Visit: Payer: Medicaid Other

## 2022-08-09 VITALS — BP 116/75 | HR 93 | Wt 186.0 lb

## 2022-08-09 DIAGNOSIS — Z1339 Encounter for screening examination for other mental health and behavioral disorders: Secondary | ICD-10-CM

## 2022-08-09 DIAGNOSIS — Z3A28 28 weeks gestation of pregnancy: Secondary | ICD-10-CM

## 2022-08-09 DIAGNOSIS — Z9884 Bariatric surgery status: Secondary | ICD-10-CM

## 2022-08-09 DIAGNOSIS — Z3480 Encounter for supervision of other normal pregnancy, unspecified trimester: Secondary | ICD-10-CM

## 2022-08-09 DIAGNOSIS — Z3483 Encounter for supervision of other normal pregnancy, third trimester: Secondary | ICD-10-CM

## 2022-08-09 DIAGNOSIS — G44229 Chronic tension-type headache, not intractable: Secondary | ICD-10-CM

## 2022-08-09 MED ORDER — ACETAMINOPHEN 325 MG PO TABS: 650.0000 mg | ORAL_TABLET | ORAL | 0 refills | Status: DC | PRN

## 2022-08-09 MED ORDER — CYCLOBENZAPRINE HCL 10 MG PO TABS: 10.0000 mg | ORAL_TABLET | Freq: Three times a day (TID) | ORAL | 1 refills | Status: DC | PRN

## 2022-08-09 MED ORDER — CETIRIZINE HCL 10 MG PO TABS: 10.0000 mg | ORAL_TABLET | Freq: Every day | ORAL | 2 refills | Status: DC

## 2022-08-09 NOTE — Progress Notes (Signed)
ROB   2hr GTT will be rescheduled per pt has to go to work.   T -Dap: Declined   CC: Recurrent Headaches taking Tylenol no relief recent MAU on 08/03/22. Cocktail did not give much relief wants to discuss management of headaches. Denies any visual changes, no swelling and no upper abdominal pain.

## 2022-08-09 NOTE — Progress Notes (Signed)
PRENATAL VISIT NOTE  Subjective:  Miranda Curtis is a 22 y.o. G4P1021 at [redacted]w[redacted]d being seen today for ongoing prenatal care.  She is currently monitored for the following issues for this high-risk pregnancy and has Maternal varicella, non-immune; Vitamin B 12 deficiency; History of depression; Missed abortion; Supervision of other normal pregnancy, antepartum; Obesity in pregnancy; History of diabetes mellitus; History of bariatric surgery; Vitamin D deficiency; and Obesity (BMI 30.0-34.9) on their problem list.  Patient reports  ongoing headaches. Present at visit, no pain medication today. She was evaluated for this complaint in MAU on 08/03/2022. States she received headache cocktail but did not experience much relief. Has also tried Tylenol 500 mg with intermittent success .  Contractions: Not present. Vag. Bleeding: None.  Movement: Present. Denies leaking of fluid.   The following portions of the patient's history were reviewed and updated as appropriate: allergies, current medications, past family history, past medical history, past social history, past surgical history and problem list. Problem list updated.  Objective:   Vitals:   08/09/22 0927  BP: 116/75  Pulse: 93  Weight: 186 lb (84.4 kg)    Fetal Status: Fetal Heart Rate (bpm): 145 Fundal Height: 29 cm Movement: Present     General:  Alert, oriented and cooperative. Patient is in no acute distress.  Skin: Skin is warm and dry. No rash noted.   Cardiovascular: Normal heart rate noted  Respiratory: Normal respiratory effort, no problems with respiration noted  Abdomen: Soft, gravid, appropriate for gestational age.  Pain/Pressure: Absent     Pelvic: Cervical exam deferred        Extremities: Normal range of motion.  Edema: None  Mental Status: Normal mood and affect. Normal behavior. Normal judgment and thought content.   Assessment and Plan:  Pregnancy: G4P1021 at [redacted]w[redacted]d  1. Supervision of other normal pregnancy,  antepartum - S/p MAU visit 08/03/2022 - Normotensive - Non-fasting, planning to do 1 hour GTT due to history of bariatric surgery, but has to be at work in 75 min.  - Patient will schedule lab only visit for 1 hour non-fasting GTT  2. Chronic tension-type headache, not intractable -Patient with audible sinus congestion today - Begin new focused regimen of interventions for headache management: - cetirizine (ZYRTEC ALLERGY) 10 MG tablet; Take 1 tablet (10 mg total) by mouth daily.  Dispense: 30 tablet; Refill: 2 - cyclobenzaprine (FLEXERIL) 10 MG tablet; Take 1 tablet (10 mg total) by mouth every 8 (eight) hours as needed for muscle spasms.  Dispense: 30 tablet; Refill: 1 - acetaminophen (TYLENOL) 325 MG tablet; Take 2 tablets (650 mg total) by mouth every 4 (four) hours as needed for up to 30 doses for moderate pain.  Dispense: 60 tablet; Refill: 0 - AMB referral to headache clinic  3. [redacted] weeks gestation of pregnancy   4. History of bariatric surgery   Preterm labor symptoms and general obstetric precautions including but not limited to vaginal bleeding, contractions, leaking of fluid and fetal movement were reviewed in detail with the patient. Please refer to After Visit Summary for other counseling recommendations.  Return Please schedule for KTC for headache, also needs non-fasting 1 hour GTT lab only visit.  Future Appointments  Date Time Provider Department Center  08/09/2022 10:35 AM Calvert Cantor, CNM [facility]  08/10/2022  9:00 AM [facility] LAB [facility]  08/14/2022  8:30 AM [facility] NURSE [facility]  08/14/2022  8:45 AM [facility] US5 [facility]  08/23/2022  8:35 AM Clayton Bibles  C, CNM [facility]  09/04/2022  9:35 AM [facility]CV [facility] ECHO 5 [facility]  09/06/2022  8:15 AM Calvert Cantor, CNM [facility]  09/07/2022  9:30 AM Teague Edwena Blow, PA-C [facility]  09/10/2022  9:15 AM [facility] NURSE [facility]   09/10/2022  9:30 AM [facility] US3 [facility]  09/20/2022 10:15 AM Calvert Cantor, CNM [facility]  09/21/2022  3:40 PM Shari Prows, Kathlynn Grate, MD [facility] None  10/04/2022  9:35 AM Anyanwu, Jethro Bastos, MD [facility]    Calvert Cantor, CNM

## 2022-08-10 ENCOUNTER — Other Ambulatory Visit: Payer: Medicaid Other

## 2022-08-13 ENCOUNTER — Other Ambulatory Visit: Payer: Medicaid Other

## 2022-08-14 ENCOUNTER — Ambulatory Visit: Payer: Medicaid Other | Attending: Obstetrics and Gynecology

## 2022-08-14 ENCOUNTER — Ambulatory Visit: Payer: Medicaid Other | Admitting: *Deleted

## 2022-08-14 VITALS — BP 114/70 | HR 100

## 2022-08-14 DIAGNOSIS — Z3A28 28 weeks gestation of pregnancy: Secondary | ICD-10-CM

## 2022-08-14 DIAGNOSIS — O99843 Bariatric surgery status complicating pregnancy, third trimester: Secondary | ICD-10-CM | POA: Diagnosis not present

## 2022-08-14 DIAGNOSIS — O99213 Obesity complicating pregnancy, third trimester: Secondary | ICD-10-CM | POA: Diagnosis not present

## 2022-08-14 DIAGNOSIS — E669 Obesity, unspecified: Secondary | ICD-10-CM

## 2022-08-14 DIAGNOSIS — Z9884 Bariatric surgery status: Secondary | ICD-10-CM | POA: Diagnosis present

## 2022-08-14 DIAGNOSIS — Z362 Encounter for other antenatal screening follow-up: Secondary | ICD-10-CM | POA: Insufficient documentation

## 2022-08-14 DIAGNOSIS — O365930 Maternal care for other known or suspected poor fetal growth, third trimester, not applicable or unspecified: Secondary | ICD-10-CM

## 2022-08-14 DIAGNOSIS — O99212 Obesity complicating pregnancy, second trimester: Secondary | ICD-10-CM | POA: Insufficient documentation

## 2022-08-15 ENCOUNTER — Encounter: Payer: Self-pay | Admitting: *Deleted

## 2022-08-15 ENCOUNTER — Encounter: Payer: Self-pay | Admitting: Advanced Practice Midwife

## 2022-08-17 ENCOUNTER — Other Ambulatory Visit (INDEPENDENT_AMBULATORY_CARE_PROVIDER_SITE_OTHER): Payer: Medicaid Other

## 2022-08-17 DIAGNOSIS — Z3480 Encounter for supervision of other normal pregnancy, unspecified trimester: Secondary | ICD-10-CM

## 2022-08-20 LAB — CBC
Hematocrit: 33.8 % — ABNORMAL LOW (ref 34.0–46.6)
Hemoglobin: 11.1 g/dL (ref 11.1–15.9)
MCH: 25.3 pg — ABNORMAL LOW (ref 26.6–33.0)
MCHC: 32.8 g/dL (ref 31.5–35.7)
MCV: 77 fL — ABNORMAL LOW (ref 79–97)
Platelets: 284 10*3/uL (ref 150–450)
RBC: 4.39 x10E6/uL (ref 3.77–5.28)
RDW: 13.1 % (ref 11.7–15.4)
WBC: 7.2 10*3/uL (ref 3.4–10.8)

## 2022-08-20 LAB — RPR: RPR Ser Ql: NONREACTIVE

## 2022-08-20 LAB — HEMOGLOBIN A1C
Est. average glucose Bld gHb Est-mCnc: <74 mg/dL
Hgb A1c MFr Bld: <4.2 % — ABNORMAL LOW (ref 4.8–5.6)

## 2022-08-20 LAB — HIV ANTIBODY (ROUTINE TESTING W REFLEX): HIV Screen 4th Generation wRfx: NONREACTIVE

## 2022-08-23 ENCOUNTER — Encounter: Payer: Medicaid Other | Admitting: Advanced Practice Midwife

## 2022-08-23 ENCOUNTER — Ambulatory Visit (INDEPENDENT_AMBULATORY_CARE_PROVIDER_SITE_OTHER): Payer: Medicaid Other | Admitting: Advanced Practice Midwife

## 2022-08-23 VITALS — BP 106/73 | HR 97 | Wt 187.0 lb

## 2022-08-23 DIAGNOSIS — Z9884 Bariatric surgery status: Secondary | ICD-10-CM

## 2022-08-23 DIAGNOSIS — Z3A30 30 weeks gestation of pregnancy: Secondary | ICD-10-CM

## 2022-08-23 DIAGNOSIS — Z3480 Encounter for supervision of other normal pregnancy, unspecified trimester: Secondary | ICD-10-CM

## 2022-08-23 NOTE — Progress Notes (Signed)
   PRENATAL VISIT NOTE  Subjective:  Miranda Curtis is a 22 y.o. G4P1021 at [redacted]w[redacted]d being seen today for ongoing prenatal care.  She is currently monitored for the following issues for this low-risk pregnancy and has Maternal varicella, non-immune; Vitamin B 12 deficiency; Supervision of other normal pregnancy, antepartum; Obesity in pregnancy; History of diabetes mellitus; History of bariatric surgery; Vitamin D deficiency; and Obesity (BMI 30.0-34.9) on their problem list.  Patient reports no complaints.  Contractions: Irritability. Vag. Bleeding: None.  Movement: Present. Denies leaking of fluid.   Patient would like to review most recent MFM ultrasound and 28 week labs.  The following portions of the patient's history were reviewed and updated as appropriate: allergies, current medications, past family history, past medical history, past social history, past surgical history and problem list. Problem list updated.  Objective:   Vitals:   08/23/22 0857  BP: 106/73  Pulse: 97  Weight: 187 lb (84.8 kg)    Fetal Status: Fetal Heart Rate (bpm): 142   Movement: Present     General:  Alert, oriented and cooperative. Patient is in no acute distress.  Skin: Skin is warm and dry. No rash noted.   Cardiovascular: Normal heart rate noted  Respiratory: Normal respiratory effort, no problems with respiration noted  Abdomen: Soft, gravid, appropriate for gestational age.  Pain/Pressure: Present     Pelvic: Cervical exam deferred        Extremities: Normal range of motion.  Edema: None  Mental Status: Normal mood and affect. Normal behavior. Normal judgment and thought content.   Assessment and Plan:  Pregnancy: G4P1021 at [redacted]w[redacted]d  1. Supervision of other normal pregnancy, antepartum - MFM ultrasound report and labs reviewed - Discussed kick counts, interventions for low kick number, indications for evaluation in MAU  2. History of bariatric surgery - Serial growth ultrasounds with MFM - EFW  13% on 08/14/2022 at 28+5 GA  3. [redacted] weeks gestation of pregnancy   Preterm labor symptoms and general obstetric precautions including but not limited to vaginal bleeding, contractions, leaking of fluid and fetal movement were reviewed in detail with the patient. Please refer to After Visit Summary for other counseling recommendations.    Future Appointments  Date Time Provider Department Center  09/04/2022  9:35 AM MC-CV [facility] ECHO 5 [facility]CHO [facility]  09/06/2022  8:15 AM Calvert Cantor, CNM [facility]  09/07/2022  9:30 AM Teague Docia Chuck [facility]  09/10/2022  9:15 AM [facility] NURSE [facility]  09/10/2022  9:30 AM [facility] US3 [facility]  09/20/2022 10:15 AM Calvert Cantor, CNM [facility]  09/21/2022  3:40 PM Meriam Sprague, MD [facility] None  10/04/2022  9:35 AM Anyanwu, Jethro Bastos, MD [facility]  10/10/2022  8:35 AM Seville Bing, MD [facility]  10/17/2022  8:35 AM Reva Bores, MD [facility]  10/24/2022  9:35 AM Macon Large, Jethro Bastos, MD [facility]  10/31/2022  8:35 AM Reva Bores, MD [facility]    Calvert Cantor, CNM

## 2022-08-23 NOTE — Patient Instructions (Signed)
[email or web address]

## 2022-08-23 NOTE — Progress Notes (Signed)
ROB   Wants to discuss labs and recent U/S

## 2022-08-27 ENCOUNTER — Encounter (HOSPITAL_COMMUNITY): Payer: Self-pay | Admitting: Obstetrics and Gynecology

## 2022-08-27 ENCOUNTER — Inpatient Hospital Stay (HOSPITAL_COMMUNITY)
Admission: AD | Admit: 2022-08-27 | Discharge: 2022-08-27 | Disposition: A | Discharge status: Home / Self Care | Payer: Medicaid Other | Attending: Obstetrics and Gynecology | Admitting: Obstetrics and Gynecology

## 2022-08-27 ENCOUNTER — Encounter: Payer: Self-pay | Admitting: Advanced Practice Midwife

## 2022-08-27 DIAGNOSIS — R109 Unspecified abdominal pain: Secondary | ICD-10-CM

## 2022-08-27 DIAGNOSIS — Z3A30 30 weeks gestation of pregnancy: Secondary | ICD-10-CM | POA: Diagnosis not present

## 2022-08-27 DIAGNOSIS — M549 Dorsalgia, unspecified: Secondary | ICD-10-CM

## 2022-08-27 DIAGNOSIS — O26893 Other specified pregnancy related conditions, third trimester: Secondary | ICD-10-CM | POA: Diagnosis present

## 2022-08-27 DIAGNOSIS — R1032 Left lower quadrant pain: Secondary | ICD-10-CM | POA: Insufficient documentation

## 2022-08-27 DIAGNOSIS — M545 Low back pain, unspecified: Secondary | ICD-10-CM | POA: Insufficient documentation

## 2022-08-27 LAB — URINALYSIS, ROUTINE W REFLEX MICROSCOPIC
Bilirubin Urine: NEGATIVE
Glucose, UA: NEGATIVE mg/dL
Hgb urine dipstick: NEGATIVE
Ketones, ur: NEGATIVE mg/dL
Nitrite: NEGATIVE
Protein, ur: 30 mg/dL — AB
Specific Gravity, Urine: 1.025 (ref 1.005–1.030)
Squamous Epithelial / HPF: >50 /HPF (ref 0–5)
pH: 6 (ref 5.0–8.0)

## 2022-08-27 MED ORDER — ACETAMINOPHEN 500 MG PO TABS
1000.0000 mg | ORAL_TABLET | Freq: Once | ORAL | Status: AC
  Administered 2022-08-27: 1000 mg via ORAL
  Filled 2022-08-27: qty 2

## 2022-08-27 MED ORDER — LACTATED RINGERS IV BOLUS
1000.0000 mL | Freq: Once | INTRAVENOUS | Status: AC
  Administered 2022-08-27: 1000 mL via INTRAVENOUS

## 2022-08-27 MED ORDER — CYCLOBENZAPRINE HCL 5 MG PO TABS
10.0000 mg | ORAL_TABLET | Freq: Once | ORAL | Status: AC
  Administered 2022-08-27: 10 mg via ORAL
  Filled 2022-08-27: qty 2

## 2022-08-27 NOTE — MAU Note (Addendum)
..  Miranda Curtis is a 22 y.o. at [redacted]w[redacted]d here in MAU reporting: Intermittent lower abdominal pain that radiates to her left side as well as lower back pain since yesterday evening. She reports she took her daughter to the [facility] and the pain began after. Denies VB or LOF. +FM. Took 1000 mg of Tylenol and a warm bath yesterday evening with no relief.  Next OB appointment 5/30.  Onset of complaint: Yesterday afternoon  Pain score:  5/10 lower back 5/10 lower abdomen  FHT: 141 initial external Lab orders placed from triage:  UA

## 2022-08-27 NOTE — MAU Provider Note (Signed)
History     CSN: [id number]  Arrival date and time: 08/27/22 1208   Event Date/Time   First Provider Initiated Contact with Patient 08/27/22 1419      Chief Complaint  Patient presents with   Abdominal Pain   Back Pain   HPI Miranda Curtis is a 22 y.o. [id number] at [redacted]w[redacted]d who presents to MAU with chief complaints of abdominal and low back pain. These are new problems, onset yesterday after she took her daughter to the [facility]. She attempted management with 1,000 mg Tylenol taken last night. She has not taken pain medicine today. She denies contractions, vaginal bleeding, leaking of fluid, decreased fetal movement, fever, falls, or recent illness.   Abdominal Pain Pain score 5/10. Locus is suprapubic region and pain radiates to her left side. She denies aggravating or alleviating factors.   Low back pain Pain score 5/10. Pain is generalized to her lower back. She denies dysuria, flank pain, hematuria and fever.  OB History     Gravida  4   Para  1   Term  1   Preterm      AB  2   Living  1      SAB  2   IAB      Ectopic      Multiple  0   Live Births  1           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-has resolved following surgery   Eczema    History of depression 11/18/2017   Missed abortion 04/07/2019    Past Surgical History:  Procedure Laterality Date   SLEEVE GASTROPLASTY  10/12/2021   UPPER GASTROINTESTINAL ENDOSCOPY N/A     Family History  Problem Relation Age of Onset   Diabetes Mother    Healthy Mother    Sickle cell trait Mother    Sickle cell trait Father    Rashes / Skin problems Father        eczema   Asthma Sister    Rashes / Skin problems Sister        eczema   Rashes / Skin problems Sister        eczema   Rashes / Skin  problems Sister        eczema   Sickle cell anemia Sister    Rashes / Skin problems Sister        eczema   Autism Brother    Healthy Brother    Diabetes Maternal Grandmother    Hypertension Maternal Grandmother    Cancer Maternal Grandfather        prostate   Cancer Paternal Grandmother 88       lung   Healthy Paternal Grandfather    Ovarian cancer Neg Hx    Colon cancer Neg Hx    Breast cancer Neg Hx     Social History   Tobacco Use   Smoking status: Never   Smokeless tobacco: Never  Vaping Use   Vaping Use: Never used  Substance Use Topics   Alcohol use: No   Drug use: No    Allergies:  Allergies  Allergen Reactions   Metoclopramide Other (See Comments)    "Severe panic attack" "Severe panic attack" "Severe panic attack"    Medications Prior to  Admission  Medication Sig Dispense Refill Last Dose   acetaminophen (TYLENOL) 325 MG tablet Take 2 tablets (650 mg total) by mouth every 4 (four) hours as needed for up to 30 doses for moderate pain. 60 tablet 0 08/26/2022 at 1830   aspirin EC 81 MG tablet Take 1 tablet (81 mg total) by mouth daily. Take after 12 weeks for prevention of preeclampsia later in pregnancy 300 tablet 2 08/26/2022 at 1000   Prenatal Vit-Fe Fumarate-FA (PRENATAL VITAMIN PO) Take 1 tablet by mouth daily at 6 (six) AM.   Past Month   cetirizine (ZYRTEC ALLERGY) 10 MG tablet Take 1 tablet (10 mg total) by mouth daily. 30 tablet 2    cyclobenzaprine (FLEXERIL) 10 MG tablet Take 1 tablet (10 mg total) by mouth every 8 (eight) hours as needed for muscle spasms. 30 tablet 1    Vitamin D, Ergocalciferol, (DRISDOL) 1.25 MG (50000 UNIT) CAPS capsule Take 1 capsule (50,000 Units total) by mouth every 7 (seven) days. 12 capsule 0 08/22/2022    Review of Systems  Gastrointestinal:  Positive for abdominal pain.  Musculoskeletal:  Positive for back pain.  All other systems reviewed and are negative.  Physical Exam   Blood pressure (!) 104/58, pulse 86,  temperature 98.3 F (36.8 C), temperature source Oral, resp. rate 18, height 5\' 1"  (1.549 m), weight 85.2 kg, last menstrual period 01/25/2022, SpO2 100 %, unknown if currently breastfeeding.  Physical Exam Vitals and nursing note reviewed.  Constitutional:      General: She is not in acute distress.    Appearance: She is well-developed. She is not ill-appearing.  Cardiovascular:     Rate and Rhythm: Normal rate.  Pulmonary:     Effort: Pulmonary effort is normal.  Abdominal:     Comments: Gravid  Skin:    Capillary Refill: Capillary refill takes less than 2 seconds.  Neurological:     Mental Status: She is alert and oriented to person, place, and time.  Psychiatric:        Mood and Affect: Mood normal.        Behavior: Behavior normal.     MAU Course  Procedures  MDM --Reactive tracing: baseline 135, mod var, + accels, no decels --Toco: initially uterine irritability, improving to quiet tracing s/p IV fluid bolus --Abnormal UA with large component of bacteria suggesting cross contamination. Will send culture  Orders Placed This Encounter  Procedures   Culture, OB Urine   Urinalysis, Routine w reflex microscopic -Urine, Clean Catch   Insert peripheral IV   Discharge patient   Patient Vitals for the past 24 hrs:  BP Temp Temp src Pulse Resp SpO2 Height Weight  08/27/22 1425 (!) 104/58 98.3 F (36.8 C) Oral 86 18 100 % -- --  08/27/22 1308 106/64 -- -- (!) 103 -- 99 % -- --  08/27/22 1230 107/62 97.9 F (36.6 C) Oral 99 15 100 % 5\' 1"  (1.549 m) 85.2 kg   Results for orders placed or performed during the hospital encounter of 08/27/22 (from the past 24 hour(s))  Urinalysis, Routine w reflex microscopic -Urine, Clean Catch     Status: Abnormal   Collection Time: 08/27/22 12:11 PM  Result Value Ref Range   Color, Urine AMBER (A) YELLOW   APPearance CLOUDY (A) CLEAR   Specific Gravity, Urine 1.025 1.005 - 1.030   pH 6.0 5.0 - 8.0   Glucose, UA NEGATIVE NEGATIVE mg/dL    Hgb urine dipstick NEGATIVE NEGATIVE   Bilirubin  Urine NEGATIVE NEGATIVE   Ketones, ur NEGATIVE NEGATIVE mg/dL   Protein, ur 30 (A) NEGATIVE mg/dL   Nitrite NEGATIVE NEGATIVE   Leukocytes,Ua MODERATE (A) NEGATIVE   RBC / HPF 0-5 0 - 5 RBC/hpf   WBC, UA 21-50 0 - 5 WBC/hpf   Bacteria, UA MANY (A) NONE SEEN   Squamous Epithelial / HPF >50 0 - 5 /HPF   Mucus PRESENT    Meds ordered this encounter  Medications   acetaminophen (TYLENOL) tablet 1,000 mg   cyclobenzaprine (FLEXERIL) tablet 10 mg   lactated ringers bolus 1,000 mL   Assessment and Plan  --22 y.o. [id number] at [redacted]w[redacted]d  --Reactive tracing --Cervix closed/thick/ballotable per my digital exam --Pain improving with PO meds, continue outpatient --Discharge home in stable condition  F/U: --Next OB appointment is 05/30 at [facility], [place], MSN, CNM 08/27/2022, 6:59 PM

## 2022-08-28 LAB — CULTURE, OB URINE: Culture: 20000 — AB

## 2022-08-29 LAB — CULTURE, OB URINE

## 2022-09-04 ENCOUNTER — Ambulatory Visit (HOSPITAL_COMMUNITY): Payer: Medicaid Other | Attending: Cardiology

## 2022-09-04 ENCOUNTER — Institutional Professional Consult (permissible substitution): Payer: Medicaid Other | Admitting: Physician Assistant

## 2022-09-04 ENCOUNTER — Encounter: Payer: Self-pay | Admitting: Advanced Practice Midwife

## 2022-09-04 ENCOUNTER — Encounter (HOSPITAL_COMMUNITY): Payer: Self-pay | Admitting: Cardiology

## 2022-09-06 ENCOUNTER — Encounter: Payer: Medicaid Other | Admitting: Advanced Practice Midwife

## 2022-09-06 ENCOUNTER — Telehealth: Payer: Self-pay

## 2022-09-06 NOTE — Telephone Encounter (Signed)
Called pt about today's appt. Pt states her ride was not available and would like to reschedule

## 2022-09-07 ENCOUNTER — Institutional Professional Consult (permissible substitution): Payer: Medicaid Other | Admitting: Physician Assistant

## 2022-09-10 ENCOUNTER — Ambulatory Visit: Payer: Medicaid Other

## 2022-09-10 NOTE — Telephone Encounter (Signed)
Good morning, I think she needs to come in for an assessment or at least as RN visit to collect a swab. The level of irritation she's describing is beyond what I would expect for yeast. Thanks SW

## 2022-09-15 ENCOUNTER — Inpatient Hospital Stay (HOSPITAL_BASED_OUTPATIENT_CLINIC_OR_DEPARTMENT_OTHER): Payer: Medicaid Other

## 2022-09-15 ENCOUNTER — Encounter (HOSPITAL_COMMUNITY): Payer: Self-pay | Admitting: Obstetrics and Gynecology

## 2022-09-15 ENCOUNTER — Inpatient Hospital Stay (EMERGENCY_DEPARTMENT_HOSPITAL)
Admission: AD | Admit: 2022-09-15 | Discharge: 2022-09-15 | Disposition: A | Discharge status: Home / Self Care | Payer: Medicaid Other | Source: Ambulatory Visit | Attending: Obstetrics and Gynecology | Admitting: Obstetrics and Gynecology

## 2022-09-15 ENCOUNTER — Inpatient Hospital Stay (HOSPITAL_COMMUNITY)
Admission: AD | Admit: 2022-09-15 | Discharge: 2022-09-15 | Disposition: A | Discharge status: Home / Self Care | Payer: Medicaid Other | Attending: Obstetrics and Gynecology | Admitting: Obstetrics and Gynecology

## 2022-09-15 DIAGNOSIS — O4703 False labor before 37 completed weeks of gestation, third trimester: Secondary | ICD-10-CM | POA: Diagnosis not present

## 2022-09-15 DIAGNOSIS — Z3689 Encounter for other specified antenatal screening: Secondary | ICD-10-CM

## 2022-09-15 DIAGNOSIS — E669 Obesity, unspecified: Secondary | ICD-10-CM | POA: Diagnosis not present

## 2022-09-15 DIAGNOSIS — O365930 Maternal care for other known or suspected poor fetal growth, third trimester, not applicable or unspecified: Secondary | ICD-10-CM

## 2022-09-15 DIAGNOSIS — O99213 Obesity complicating pregnancy, third trimester: Secondary | ICD-10-CM

## 2022-09-15 DIAGNOSIS — O26893 Other specified pregnancy related conditions, third trimester: Secondary | ICD-10-CM | POA: Insufficient documentation

## 2022-09-15 DIAGNOSIS — Z3A33 33 weeks gestation of pregnancy: Secondary | ICD-10-CM | POA: Insufficient documentation

## 2022-09-15 DIAGNOSIS — O479 False labor, unspecified: Secondary | ICD-10-CM | POA: Diagnosis not present

## 2022-09-15 DIAGNOSIS — O4693 Antepartum hemorrhage, unspecified, third trimester: Secondary | ICD-10-CM

## 2022-09-15 LAB — URINALYSIS, ROUTINE W REFLEX MICROSCOPIC
Bilirubin Urine: NEGATIVE
Glucose, UA: NEGATIVE mg/dL
Ketones, ur: NEGATIVE mg/dL
Leukocytes,Ua: NEGATIVE
Nitrite: NEGATIVE
Protein, ur: NEGATIVE mg/dL
Specific Gravity, Urine: 1.01 (ref 1.005–1.030)
pH: 7 (ref 5.0–8.0)

## 2022-09-15 LAB — WET PREP, GENITAL
Clue Cells Wet Prep HPF POC: NONE SEEN
Sperm: NONE SEEN
Trich, Wet Prep: NONE SEEN
WBC, Wet Prep HPF POC: <10 (ref <10)
Yeast Wet Prep HPF POC: NONE SEEN

## 2022-09-15 MED ORDER — LACTATED RINGERS IV BOLUS
1000.0000 mL | Freq: Once | INTRAVENOUS | Status: AC
  Administered 2022-09-15: 1000 mL via INTRAVENOUS

## 2022-09-15 MED ORDER — CYCLOBENZAPRINE HCL 5 MG PO TABS
10.0000 mg | ORAL_TABLET | Freq: Once | ORAL | Status: AC
  Administered 2022-09-15: 10 mg via ORAL
  Filled 2022-09-15: qty 2

## 2022-09-15 MED ORDER — ACETAMINOPHEN 500 MG PO TABS
1000.0000 mg | ORAL_TABLET | Freq: Once | ORAL | Status: AC
  Administered 2022-09-15: 1000 mg via ORAL
  Filled 2022-09-15: qty 2

## 2022-09-15 MED ORDER — NIFEDIPINE 10 MG PO CAPS
10.0000 mg | ORAL_CAPSULE | ORAL | Status: DC | PRN
  Filled 2022-09-15: qty 1

## 2022-09-15 NOTE — MAU Provider Note (Addendum)
History     CSN: [id number]  Arrival date and time: 09/15/22 0140   Event Date/Time   First Provider Initiated Contact with Patient 09/15/22 [phone]      Chief Complaint  Patient presents with   Vaginal Bleeding   Abdominal Pain   Vaginal Bleeding Associated symptoms include abdominal pain and headaches.  Abdominal Pain Associated symptoms include headaches.   Miranda Curtis is a 22 y.o. [id number] at [redacted]w[redacted]d who presents to MAU with chief complaint of vaginal bleeding. This is a new complaint, onset this evening. Patient endorses seeing a very large smear of bright red blood in her commode after voiding. She did not visualize clots. She is unsure about ongoing bleeding. She denies leaking of fluid, decreased fetal movement, fever, falls, or recent illness. She is remote from sexual intercourse.  Patient also reports lower abdominal pain and pelvic pressure, ongoing and worsening over time. Pain score is 5/10 and pain is "constant". She reports that she's been having Braxton Hicks contractions for weeks but tonight she feels significantly more uncomfortable.  She has not taken medication for this complaint.  Patient also reports new onset headache. Pain score is 4/10. She denies aggravating or alleviating factors.   Patient receives care with [facility].  OB History     Gravida  4   Para  1   Term  1   Preterm      AB  2   Living  1      SAB  2   IAB      Ectopic      Multiple  0   Live Births  1           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-has resolved following surgery   Eczema    History of depression 11/18/2017   Missed abortion 04/07/2019    Past Surgical History:  Procedure Laterality Date   SLEEVE GASTROPLASTY  10/12/2021   UPPER GASTROINTESTINAL  ENDOSCOPY N/A     Family History  Problem Relation Age of Onset   Diabetes Mother    Healthy Mother    Sickle cell trait Mother    Sickle cell trait Father    Rashes / Skin problems Father        eczema   Asthma Sister    Rashes / Skin problems Sister        eczema   Rashes / Skin problems Sister        eczema   Rashes / Skin problems Sister        eczema   Sickle cell anemia Sister    Rashes / Skin problems Sister        eczema   Autism Brother    Healthy Brother    Diabetes Maternal Grandmother    Hypertension Maternal Grandmother    Cancer Maternal Grandfather        prostate   Cancer Paternal Grandmother 63       lung   Healthy Paternal Grandfather    Ovarian cancer Neg Hx    Colon cancer Neg Hx    Breast cancer Neg Hx     Social History   Tobacco Use   Smoking status: Never   Smokeless tobacco: Never  Vaping Use   Vaping Use: Never  used  Substance Use Topics   Alcohol use: No   Drug use: No    Allergies:  Allergies  Allergen Reactions   Metoclopramide Other (See Comments)    "Severe panic attack" "Severe panic attack" "Severe panic attack"    Medications Prior to Admission  Medication Sig Dispense Refill Last Dose   aspirin EC 81 MG tablet Take 1 tablet (81 mg total) by mouth daily. Take after 12 weeks for prevention of preeclampsia later in pregnancy 300 tablet 2 Past Week   acetaminophen (TYLENOL) 325 MG tablet Take 2 tablets (650 mg total) by mouth every 4 (four) hours as needed for up to 30 doses for moderate pain. 60 tablet 0    cetirizine (ZYRTEC ALLERGY) 10 MG tablet Take 1 tablet (10 mg total) by mouth daily. 30 tablet 2    cyclobenzaprine (FLEXERIL) 10 MG tablet Take 1 tablet (10 mg total) by mouth every 8 (eight) hours as needed for muscle spasms. 30 tablet 1    Prenatal Vit-Fe Fumarate-FA (PRENATAL VITAMIN PO) Take 1 tablet by mouth daily at 6 (six) AM.   More than a month   Vitamin D, Ergocalciferol, (DRISDOL) 1.25 MG (50000 UNIT) CAPS  capsule Take 1 capsule (50,000 Units total) by mouth every 7 (seven) days. 12 capsule 0     Review of Systems  Gastrointestinal:  Positive for abdominal pain.  Genitourinary:  Positive for vaginal bleeding.  Neurological:  Positive for headaches.  All other systems reviewed and are negative.  Physical Exam   Blood pressure 100/65, pulse 88, temperature 98.1 F (36.7 C), temperature source Oral, resp. rate 18, height 5\' 1"  (1.549 m), weight 85.5 kg, last menstrual period 01/25/2022, SpO2 100 %, unknown if currently breastfeeding.  Physical Exam Vitals and nursing note reviewed. Exam conducted with a chaperone present.  Constitutional:      General: She is not in acute distress.    Appearance: She is well-developed. She is not ill-appearing.  Cardiovascular:     Rate and Rhythm: Normal rate and regular rhythm.     Heart sounds: Normal heart sounds.  Pulmonary:     Effort: Pulmonary effort is normal.     Breath sounds: Normal breath sounds.  Abdominal:     Palpations: Abdomen is soft.     Comments: Gravid  Genitourinary:    Comments: Pelvic exam: External genitalia normal, vaginal walls pink and well rugated, cervix visually closed, no lesions noted. Small amount of dark red blood in posterior vault. No blood on glove during subsequent digital cervical exam   Skin:    Capillary Refill: Capillary refill takes less than 2 seconds.  Neurological:     Mental Status: She is alert and oriented to person, place, and time.  Psychiatric:        Mood and Affect: Mood normal.        Behavior: Behavior normal.     MAU Course  Procedures  MDM --Reactive tracing: baseline 130, mod var, +accels, no decels --Toco: initially irregular irritability with occasional contraction, improved s/p IV fluid bolus --Initial assessment reviewed with Dr. Donavan Foil, who agrees patient is appropriate for preterm labor evaluation, current status not consistent with abruption.  --Procardia held due to patient  BP --0455: CNM returned to bedside. Patient sleeping in left lateral. Cervix remains closed 2 hour s/p initial exam --0655: CNM at bedside. Patient sleeping. Verbalizes she is still uncomfortable. Cervix remains closed (4 hours since initial exam). Toco now smooth. No bleeding seen since initial speculum exam.  Orders Placed This Encounter  Procedures   Wet prep, genital   [place] MFM OB Limited   Urinalysis, Routine w reflex microscopic -Urine, Clean Catch   Insert peripheral IV   Patient Vitals for the past 24 hrs:  BP Temp Temp src Pulse Resp SpO2 Height Weight  09/15/22 0346 -- -- -- -- -- 100 % -- --  09/15/22 0345 100/65 -- -- 88 -- -- -- --  09/15/22 0341 -- -- -- -- -- 100 % -- --  09/15/22 0336 -- -- -- -- -- 100 % -- --  09/15/22 0331 -- -- -- -- -- 100 % -- --  09/15/22 0326 -- -- -- -- -- 100 % -- --  09/15/22 0321 -- -- -- -- -- 100 % -- --  09/15/22 0316 -- -- -- -- -- 100 % -- --  09/15/22 0311 -- -- -- -- -- 100 % -- --  09/15/22 0306 -- -- -- -- -- 100 % -- --  09/15/22 0301 -- -- -- -- -- 98 % -- --  09/15/22 0256 -- -- -- -- -- 100 % -- --  09/15/22 0251 -- -- -- -- -- 100 % -- --  09/15/22 0246 -- -- -- -- -- 100 % -- --  09/15/22 0241 -- -- -- -- -- 100 % -- --  09/15/22 0236 -- -- -- -- -- 100 % -- --  09/15/22 0231 -- -- -- -- -- 100 % -- --  09/15/22 0226 -- -- -- -- -- 100 % -- --  09/15/22 0225 104/72 -- -- (!) 102 -- -- -- --  09/15/22 0221 -- -- -- -- -- 100 % -- --  09/15/22 0216 106/70 98.1 F (36.7 C) Oral 96 18 100 % -- --  09/15/22 0211 -- -- -- -- -- -- 5\' 1"  (1.549 m) 85.5 kg   Results for orders placed or performed during the hospital encounter of 09/15/22 (from the past 24 hour(s))  Wet prep, genital     Status: None   Collection Time: 09/15/22  2:44 AM  Result Value Ref Range   Yeast Wet Prep HPF POC NONE SEEN NONE SEEN   Trich, Wet Prep NONE SEEN NONE SEEN   Clue Cells Wet Prep HPF POC NONE SEEN NONE SEEN   WBC, Wet Prep HPF POC <10 <10    Sperm NONE SEEN   Urinalysis, Routine w reflex microscopic -Urine, Clean Catch     Status: Abnormal   Collection Time: 09/15/22  3:48 AM  Result Value Ref Range   Color, Urine YELLOW YELLOW   APPearance CLEAR CLEAR   Specific Gravity, Urine 1.010 1.005 - 1.030   pH 7.0 5.0 - 8.0   Glucose, UA NEGATIVE NEGATIVE mg/dL   Hgb urine dipstick LARGE (A) NEGATIVE   Bilirubin Urine NEGATIVE NEGATIVE   Ketones, ur NEGATIVE NEGATIVE mg/dL   Protein, ur NEGATIVE NEGATIVE mg/dL   Nitrite NEGATIVE NEGATIVE   Leukocytes,Ua NEGATIVE NEGATIVE   RBC / HPF 21-50 0 - 5 RBC/hpf   WBC, UA 0-5 0 - 5 WBC/hpf   Bacteria, UA RARE (A) NONE SEEN   Squamous Epithelial / HPF 0-5 0 - 5 /HPF   Mucus PRESENT    Meds ordered this encounter  Medications   lactated ringers bolus 1,000 mL   NIFEdipine (PROCARDIA) capsule 10 mg   acetaminophen (TYLENOL) tablet 1,000 mg   cyclobenzaprine (FLEXERIL) tablet 10 mg   Assessment and Plan  --21 y.o. [id number] at [redacted]w[redacted]d  --  One episode of bleeding early this morning --No active bleeding during MAU encounter --Reactive tracing --Cervix remains closed 4 hours s/p initial exam --No acute findings on MFM OB Limited US --Anterior placenta --Care coordinated with Dr. Donavan Foil --Discharge home in stable condition with return precautions  Calvert Cantor, MSA, MSN, CNM 09/15/2022, 8:09 AM

## 2022-09-15 NOTE — MAU Note (Signed)
.  Miranda Curtis is a 22 y.o. at [redacted]w[redacted]d here in MAU reporting: all day has had constant pressure in pelvic area; at 0000 today felt painful constant pressure in her lower abdomen; around 0030 patient went to bathroom because she felt like she had to pee "very badly", pt reports seeing bright red blood in toilet after urinated, denies clots or having to wear a pad at this time. Has photo of vaginal bleeding after wiping on her phone. Patient reports it "is not a lot of blood but more than bloody show". Patient reports bleeding has slowed down compared to when it first started but is still bleeding.   Patient reports since end of last week she has had intense vaginal itching and was told by a nurse to use monistat,reports it helped but still reports has vaginal itching "here and there"; patient denies discharge.   Denies LOF and reports +FM.  Onset of complaint: 0000 Pain score: 5/10 Vitals:   09/15/22 0216  BP: 106/70  Pulse: 96  Resp: 18  Temp: 98.1 F (36.7 C)  SpO2: 100%     FHT:145 Lab orders placed from triage:  n/a

## 2022-09-15 NOTE — MAU Note (Signed)
..  Miranda Curtis is a 22 y.o. at [redacted]w[redacted]d here in MAU reporting: CTX's that returned around 1130 this morning while sitting at home working. She reports the CTX are in her lower abdomen and radiate to her back. She reports she is unsure of how far apart her CTX's are and she has not been timing them. Was seen in MAU early this morning and sent home around 0700. She reports she received fluids and her cervix was closed. Denies VB or LOF. +FM.   Onset of complaint: 1130 Pain score: 6/10 lower abdomen   FHT: 144 initial external Lab orders placed from triage: none - UA results in Epic from [phone]

## 2022-09-15 NOTE — MAU Provider Note (Signed)
History     CSN: [id number]  Arrival date and time: 09/15/22 1615   Event Date/Time   First Provider Initiated Contact with Patient 09/15/22 1657      Chief Complaint  Patient presents with   Contractions   HPI [name] is a 22 y.o. year old [id number] female at [redacted]w[redacted]d weeks gestation who presents to MAU reporting contractions returned around 1130 today while sitting at home working; pain rated 6/10. She reports lower abdominal pain that radiates to lower back. She is unsure how far apart the contractions are, because she has not been able to time them. She was in MAU earlier this morning and d/c'd at 0700. Her cervix was closed then. She received IVFs. She receives [facility]; next appt is 09/20/2022.    OB History     Gravida  4   Para  1   Term  1   Preterm      AB  2   Living  1      SAB  2   IAB      Ectopic      Multiple  0   Live Births  1           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-has resolved following surgery   Eczema    History of depression 11/18/2017   Missed abortion 04/07/2019    Past Surgical History:  Procedure Laterality Date   SLEEVE GASTROPLASTY  10/12/2021   UPPER GASTROINTESTINAL ENDOSCOPY N/A     Family History  Problem Relation Age of Onset   Diabetes Mother    Healthy Mother    Sickle cell trait Mother    Sickle cell trait Father    Rashes / Skin problems Father        eczema   Asthma Sister    Rashes / Skin problems Sister        eczema   Rashes / Skin problems Sister        eczema   Rashes / Skin problems Sister        eczema   Sickle cell anemia Sister    Rashes / Skin problems Sister        eczema   Autism Brother    Healthy Brother    Diabetes Maternal Grandmother    Hypertension Maternal  Grandmother    Cancer Maternal Grandfather        prostate   Cancer Paternal Grandmother 2       lung   Healthy Paternal Grandfather    Ovarian cancer Neg Hx    Colon cancer Neg Hx    Breast cancer Neg Hx     Social History   Tobacco Use   Smoking status: Never   Smokeless tobacco: Never  Vaping Use   Vaping Use: Never used  Substance Use Topics   Alcohol use: No   Drug use: No    Allergies:  Allergies  Allergen Reactions   Metoclopramide Other (See Comments)    "Severe panic attack" "Severe panic attack" "Severe panic attack"    Medications Prior to Admission  Medication Sig Dispense Refill Last Dose   acetaminophen (TYLENOL) 325 MG tablet Take 2 tablets (650 mg total) by mouth every 4 (four) hours as needed for  up to 30 doses for moderate pain. 60 tablet 0 09/15/2022   aspirin EC 81 MG tablet Take 1 tablet (81 mg total) by mouth daily. Take after 12 weeks for prevention of preeclampsia later in pregnancy 300 tablet 2 09/14/2022   Prenatal Vit-Fe Fumarate-FA (PRENATAL VITAMIN PO) Take 1 tablet by mouth daily at 6 (six) AM.   09/14/2022   Vitamin D, Ergocalciferol, (DRISDOL) 1.25 MG (50000 UNIT) CAPS capsule Take 1 capsule (50,000 Units total) by mouth every 7 (seven) days. 12 capsule 0 Past Week   cetirizine (ZYRTEC ALLERGY) 10 MG tablet Take 1 tablet (10 mg total) by mouth daily. 30 tablet 2 Unknown   cyclobenzaprine (FLEXERIL) 10 MG tablet Take 1 tablet (10 mg total) by mouth every 8 (eight) hours as needed for muscle spasms. 30 tablet 1 Unknown    Review of Systems  Constitutional: Negative.   HENT: Negative.    Eyes: Negative.   Respiratory: Negative.    Cardiovascular: Negative.   Gastrointestinal: Negative.   Endocrine: Negative.   Genitourinary:  Positive for pelvic pain.  Musculoskeletal:  Positive for back pain.  Skin: Negative.   Allergic/Immunologic: Negative.   Neurological: Negative.   Hematological: Negative.   Psychiatric/Behavioral: Negative.      Physical Exam   Blood pressure 109/69, pulse (!) 111, temperature 98.1 F (36.7 C), temperature source Oral, resp. rate 17, last menstrual period 01/25/2022, SpO2 99 %, unknown if currently breastfeeding.  Physical Exam Vitals and nursing note reviewed.  Constitutional:      General: She is sleeping.     Appearance: Normal appearance. She is obese.     Comments: Sleeping upon CNM arrival into room  Cardiovascular:     Rate and Rhythm: Tachycardia present.  Pulmonary:     Effort: Pulmonary effort is normal.  Abdominal:     Palpations: Abdomen is soft.  Genitourinary:    General: Normal vulva.     Comments: Dilation: Closed Effacement (%): Thick Cervical Position: Posterior Exam by:: Raelyn Mora, CNM  Musculoskeletal:        General: Normal range of motion.  Skin:    General: Skin is warm and dry.  Neurological:     Mental Status: She is oriented to person, place, and time.  Psychiatric:        Mood and Affect: Mood normal.        Behavior: Behavior normal.        Thought Content: Thought content normal.        Judgment: Judgment normal.    REACTIVE NST - FHR: 135 bpm / moderate variability / accels present / decels absent / TOCO: 1 UC with UI noted MAU Course  Procedures  MDM CEFM  SVE  Assessment and Plan  1. Preterm uterine contractions in third trimester, antepartum - Advised to return to MAU for 6 or more painful UCs in 1 hour - Information provided on PTL and preventing PTB   2. [redacted] weeks gestation of pregnancy   - Discharge patient - Keep scheduled appt with [facility] on 09/20/2022 - Patient verbalized an understanding of the plan of care and agrees.    Raelyn Mora, CNM 09/15/2022, 4:58 PM

## 2022-09-15 NOTE — Discharge Instructions (Signed)
Return to MAU, if you have more than 6 PAINFUL contractions in ONE hour. 

## 2022-09-17 LAB — GC/CHLAMYDIA PROBE AMP (~~LOC~~) NOT AT ARMC
Chlamydia: NEGATIVE
Comment: NEGATIVE
Comment: NORMAL
Neisseria Gonorrhea: NEGATIVE

## 2022-09-19 NOTE — Progress Notes (Deleted)
[facility]  Follow-up Evaluation  Date:  09/19/2022   ID:  Miranda Curtis, DOB 12-22-2000, MRN [id number]  PCP:  Pcp, No   [facility] Providers Cardiologist:  None  Electrophysiologist:  None       Referring MD: No ref. provider found   Chief Complaint: syncope  History of Present Illness:    Miranda Curtis is a 22 y.o. female [G4P1021] who presents for follow-up of syncope.   Patient initially seen in 06/2022 for episodes of presyncope/syncope in the setting of prolonged standing. TTE and zio ordered but not performed.  Today, ***  Prior CV Studies Reviewed: The following studies were reviewed today: No CV studies  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-has resolved following surgery   Eczema    History of depression 11/18/2017   Missed abortion 04/07/2019    Past Surgical History:  Procedure Laterality Date   SLEEVE GASTROPLASTY  10/12/2021   UPPER GASTROINTESTINAL ENDOSCOPY N/A       OB History     Gravida  4   Para  1   Term  1   Preterm      AB  2   Living  1      SAB  2   IAB      Ectopic      Multiple  0   Live Births  1               Current Medications: No outpatient medications have been marked as taking for the 09/21/22 encounter (Appointment) with Meriam Sprague, MD.     Allergies:   Metoclopramide   Social History   Socioeconomic History   Marital status: Single    Spouse name: Not on file   Number of children: 1   Years of education: 13   Highest education level: Not on file  Occupational History   Occupation: student   Occupation: CNA  Tobacco Use   Smoking status: Never   Smokeless tobacco: Never  Vaping Use   Vaping Use: Never used  Substance and Sexual Activity   Alcohol use: No    Drug use: No   Sexual activity: Yes    Partners: Male    Birth control/protection: None    Comment: last SI Friday AM 09/14/2022  Other Topics Concern   Not on file  Social History Narrative   Not on file   Social Determinants of Health   Financial Resource Strain: Low Risk  (03/19/2022)   Overall Financial Resource Strain (CARDIA)    Difficulty of Paying Living Expenses: Not hard at all  Food Insecurity: No Food Insecurity (03/19/2022)   Hunger Vital Sign    Worried About Running Out of Food in the Last Year: Never true    Ran Out of Food in the Last Year: Never true  Transportation Needs: No Transportation Needs (03/19/2022)   PRAPARE - Administrator, Civil Service (Medical): No    Lack of Transportation (Non-Medical): No  Physical Activity: Inactive (03/19/2022)   Exercise Vital Sign    Days of Exercise per Week: 0 days    Minutes of Exercise per Session: 0 min  Stress: No Stress Concern Present (03/19/2022)   [employer] of Occupational Health - Occupational Stress Questionnaire  Feeling of Stress : Not at all  Social Connections: Unknown (03/19/2022)   Social Connection and Isolation Panel [NHANES]    Frequency of Communication with Friends and Family: More than three times a week    Frequency of Social Gatherings with Friends and Family: More than three times a week    Attends Religious Services: Never    Database administrator or Organizations: No    Attends Engineer, structural: Never    Marital Status: Not on file      Family History  Problem Relation Age of Onset   Diabetes Mother    Healthy Mother    Sickle cell trait Mother    Sickle cell trait Father    Rashes / Skin problems Father        eczema   Asthma Sister    Rashes / Skin problems Sister        eczema   Rashes / Skin problems Sister        eczema   Rashes / Skin problems Sister        eczema   Sickle cell anemia Sister    Rashes / Skin problems Sister         eczema   Autism Brother    Healthy Brother    Diabetes Maternal Grandmother    Hypertension Maternal Grandmother    Cancer Maternal Grandfather        prostate   Cancer Paternal Grandmother 41       lung   Healthy Paternal Grandfather    Ovarian cancer Neg Hx    Colon cancer Neg Hx    Breast cancer Neg Hx       ROS:   Please see the history of present illness.    All other systems reviewed and are negative.   Labs/EKG Reviewed:    EKG:   EKG not ordered today. ECG 06/11/22 with NSR  Recent Labs: 04/11/2022: Magnesium 1.7 05/31/2022: TSH 0.890 06/11/2022: ALT 10; BUN 5; Creatinine, Ser 0.63; Potassium 3.9; Sodium 135 08/17/2022: Hemoglobin 11.1; Platelets 284   Recent Lipid Panel No results found for: "CHOL", "TRIG", "HDL", "CHOLHDL", "LDLCALC", "LDLDIRECT"  Physical Exam:    VS:  LMP 01/25/2022 (Exact Date)     Wt Readings from Last 3 Encounters:  09/15/22 188 lb 6.4 oz (85.5 kg)  08/27/22 187 lb 14.4 oz (85.2 kg)  08/23/22 187 lb (84.8 kg)     GEN:  Well nourished, well developed in no acute distress HEENT: Normal NECK: No JVD; No carotid bruits CARDIAC: RRR, 1/6 systolic flow murmur RESPIRATORY:  Clear to auscultation without rales, wheezing or rhonchi  ABDOMEN: Soft, non-tender, non-distended MUSCULOSKELETAL:  No edema; No deformity  SKIN: Warm and dry NEUROLOGIC:  Alert and oriented x 3 PSYCHIATRIC:  Normal affect    Risk Assessment/Risk Calculators:     ASSESSMENT & PLAN:    #Suspected Orthostatic Syncope In Pregnancy: -Common in pregnancy in the setting of decreased SVR and venous compression  -zio and TTE ordered but not performed -Discussed importance of hydration, slow position changes and compression socks to improve venous return -Recommend small, frequent meals with liberalized salt intake -If feels faint, discussed importance of laying down and elevating her legs   There are no Patient Instructions on file for this visit.   Dispo:  No  follow-ups on file.   Medication Adjustments/Labs and Tests Ordered: Current medicines are reviewed at length with the patient today.  Concerns regarding medicines are outlined above.  Tests Ordered: No orders of the defined types were placed in this encounter.  Medication Changes: No orders of the defined types were placed in this encounter.

## 2022-09-20 ENCOUNTER — Ambulatory Visit (INDEPENDENT_AMBULATORY_CARE_PROVIDER_SITE_OTHER): Payer: Medicaid Other | Admitting: Advanced Practice Midwife

## 2022-09-20 ENCOUNTER — Encounter: Payer: Self-pay | Admitting: Advanced Practice Midwife

## 2022-09-20 VITALS — BP 112/75 | HR 99 | Wt 188.0 lb

## 2022-09-20 DIAGNOSIS — M549 Dorsalgia, unspecified: Secondary | ICD-10-CM

## 2022-09-20 DIAGNOSIS — O479 False labor, unspecified: Secondary | ICD-10-CM

## 2022-09-20 DIAGNOSIS — Z3A34 34 weeks gestation of pregnancy: Secondary | ICD-10-CM

## 2022-09-20 DIAGNOSIS — O99891 Other specified diseases and conditions complicating pregnancy: Secondary | ICD-10-CM

## 2022-09-20 DIAGNOSIS — Z3480 Encounter for supervision of other normal pregnancy, unspecified trimester: Secondary | ICD-10-CM

## 2022-09-20 DIAGNOSIS — G44229 Chronic tension-type headache, not intractable: Secondary | ICD-10-CM

## 2022-09-20 MED ORDER — CYCLOBENZAPRINE HCL 10 MG PO TABS
10.0000 mg | ORAL_TABLET | Freq: Three times a day (TID) | ORAL | 1 refills | Status: DC | PRN
Start: 2022-09-20 — End: 2022-10-25

## 2022-09-20 NOTE — Progress Notes (Signed)
ROB   MAU visit on 09/15/22 for BH contractions.  CC: still notes tightness in belly and discomfort and pain radiates to lower back.

## 2022-09-21 ENCOUNTER — Ambulatory Visit: Payer: Medicaid Other | Admitting: Cardiology

## 2022-09-21 NOTE — Progress Notes (Signed)
   PRENATAL VISIT NOTE  Subjective:  Miranda Curtis is a 22 y.o. G4P1021 at [redacted]w[redacted]d being seen today for ongoing prenatal care.  She is currently monitored for the following issues for this low-risk pregnancy and has Maternal varicella, non-immune; Vitamin B 12 deficiency; Supervision of other normal pregnancy, antepartum; Obesity in pregnancy; History of diabetes mellitus; History of bariatric surgery; Vitamin D deficiency; and Obesity (BMI 30.0-34.9) on their problem list.  Patient reports  ongoing abdominal and lower back pain. Patient confirms symptoms are unchanged since onset in late second trimester. She was not able to pick up her Flexeril from the pharmacy and is amenable to trialing it.  .  Contractions: Irritability. Vag. Bleeding: None.  Movement: Present. Denies leaking of fluid.   The following portions of the patient's history were reviewed and updated as appropriate: allergies, current medications, past family history, past medical history, past social history, past surgical history and problem list. Problem list updated.  Objective:   Vitals:   09/20/22 1034  BP: 112/75  Pulse: 99  Weight: 188 lb (85.3 kg)    Fetal Status: Fetal Heart Rate (bpm): 134   Movement: Present     General:  Alert, oriented and cooperative. Patient is in no acute distress.  Skin: Skin is warm and dry. No rash noted.   Cardiovascular: Normal heart rate noted  Respiratory: Normal respiratory effort, no problems with respiration noted  Abdomen: Soft, gravid, appropriate for gestational age.  Pain/Pressure: Present     Pelvic: Cervical exam deferred        Extremities: Normal range of motion.  Edema: None  Mental Status: Normal mood and affect. Normal behavior. Normal judgment and thought content.   Assessment and Plan:  Pregnancy: G4P1021 at [redacted]w[redacted]d  1. Supervision of other normal pregnancy, antepartum - Routine care - Repeat ultrasound end of June for history of bariatric surgery - Preemptive  discussion regarding GBS and GC swabs next visit  2. Back pain affecting pregnancy in third trimester - Expected side effect of sleepiness, plan to take at night or when rest is possible - cyclobenzaprine (FLEXERIL) 10 MG tablet; Take 1 tablet (10 mg total) by mouth every 8 (eight) hours as needed for muscle spasms.  Dispense: 30 tablet; Refill: 1  3. [redacted] weeks gestation of pregnancy   4. Braxton Hick's contraction - Reviewed preterm labor precautions, when to come to MAU for evaluation  Preterm labor symptoms and general obstetric precautions including but not limited to vaginal bleeding, contractions, leaking of fluid and fetal movement were reviewed in detail with the patient. Please refer to After Visit Summary for other counseling recommendations.    Future Appointments  Date Time Provider Department Center  09/21/2022  3:40 PM Meriam Sprague, MD [facility] None  10/03/2022  8:15 AM [facility] NURSE [facility]  10/03/2022  8:30 AM [facility] US3 [facility]  10/04/2022  9:35 AM Anyanwu, Jethro Bastos, MD [facility]  10/10/2022  8:35 AM [place] Bing, MD [facility]  10/17/2022  8:35 AM Reva Bores, MD [facility]  10/24/2022  9:35 AM Anyanwu, Jethro Bastos, MD [facility]  10/31/2022  8:35 AM Reva Bores, MD [facility]    Calvert Cantor, CNM

## 2022-10-03 ENCOUNTER — Ambulatory Visit: Payer: Medicaid Other | Attending: Obstetrics and Gynecology

## 2022-10-03 ENCOUNTER — Ambulatory Visit: Payer: Medicaid Other | Admitting: *Deleted

## 2022-10-03 VITALS — BP 120/62 | HR 90

## 2022-10-03 DIAGNOSIS — Z3A35 35 weeks gestation of pregnancy: Secondary | ICD-10-CM

## 2022-10-03 DIAGNOSIS — Z362 Encounter for other antenatal screening follow-up: Secondary | ICD-10-CM | POA: Insufficient documentation

## 2022-10-03 DIAGNOSIS — Z9884 Bariatric surgery status: Secondary | ICD-10-CM | POA: Insufficient documentation

## 2022-10-03 DIAGNOSIS — O99843 Bariatric surgery status complicating pregnancy, third trimester: Secondary | ICD-10-CM

## 2022-10-03 DIAGNOSIS — O99213 Obesity complicating pregnancy, third trimester: Secondary | ICD-10-CM | POA: Insufficient documentation

## 2022-10-03 DIAGNOSIS — O4693 Antepartum hemorrhage, unspecified, third trimester: Secondary | ICD-10-CM | POA: Diagnosis not present

## 2022-10-03 DIAGNOSIS — E669 Obesity, unspecified: Secondary | ICD-10-CM

## 2022-10-03 DIAGNOSIS — O99212 Obesity complicating pregnancy, second trimester: Secondary | ICD-10-CM | POA: Insufficient documentation

## 2022-10-04 ENCOUNTER — Other Ambulatory Visit (HOSPITAL_COMMUNITY)
Admission: RE | Admit: 2022-10-04 | Discharge: 2022-10-04 | Disposition: A | Discharge status: Home / Self Care | Payer: Medicaid Other | Source: Ambulatory Visit | Attending: Obstetrics & Gynecology | Admitting: Obstetrics & Gynecology

## 2022-10-04 ENCOUNTER — Encounter: Payer: Self-pay | Admitting: Obstetrics & Gynecology

## 2022-10-04 ENCOUNTER — Ambulatory Visit (INDEPENDENT_AMBULATORY_CARE_PROVIDER_SITE_OTHER): Payer: Medicaid Other | Admitting: Obstetrics & Gynecology

## 2022-10-04 VITALS — BP 109/72 | HR 97 | Wt 190.0 lb

## 2022-10-04 DIAGNOSIS — Z3A36 36 weeks gestation of pregnancy: Secondary | ICD-10-CM

## 2022-10-04 DIAGNOSIS — Z3480 Encounter for supervision of other normal pregnancy, unspecified trimester: Secondary | ICD-10-CM | POA: Diagnosis present

## 2022-10-04 DIAGNOSIS — O99210 Obesity complicating pregnancy, unspecified trimester: Secondary | ICD-10-CM

## 2022-10-04 NOTE — Progress Notes (Signed)
   PRENATAL VISIT NOTE  Subjective:  Miranda Curtis is a 22 y.o. G4P1021 at [redacted]w[redacted]d being seen today for ongoing prenatal care.  She is currently monitored for the following issues for this low-risk pregnancy and has Maternal varicella, non-immune; Vitamin B 12 deficiency; Supervision of other normal pregnancy, antepartum; Obesity in pregnancy; History of diabetes mellitus; History of bariatric surgery; Vitamin D deficiency; and Obesity (BMI 30.0-34.9) on their problem list.  Patient reports no complaints.  Contractions: Irregular. Vag. Bleeding: None.  Movement: Present. Denies leaking of fluid.   The following portions of the patient's history were reviewed and updated as appropriate: allergies, current medications, past family history, past medical history, past social history, past surgical history and problem list.   Objective:   Vitals:   10/04/22 0952  BP: 109/72  Pulse: 97  Weight: 190 lb (86.2 kg)    Fetal Status: Fetal Heart Rate (bpm): 148   Movement: Present  Presentation: Vertex  General:  Alert, oriented and cooperative. Patient is in no acute distress.  Skin: Skin is warm and dry. No rash noted.   Cardiovascular: Normal heart rate noted  Respiratory: Normal respiratory effort, no problems with respiration noted  Abdomen: Soft, gravid, appropriate for gestational age.  Pain/Pressure: Present     Pelvic: Cervical exam performed in the presence of a chaperone Dilation: 1 Effacement (%): Thick Station: Ballotable, cultures done.  Extremities: Normal range of motion.     Mental Status: Normal mood and affect. Normal behavior. Normal judgment and thought content.   Assessment and Plan:  Pregnancy: G4P1021 at [redacted]w[redacted]d 1. Obesity in pregnancy TWG 4 lb, doing well.  2. [redacted] weeks gestation of pregnancy 3. Supervision of other normal pregnancy, antepartum Pelvic cultures done today, will follow up results and manage accordingly. - Cervicovaginal ancillary only - Strep Gp B  NAA Preterm labor symptoms and general obstetric precautions including but not limited to vaginal bleeding, contractions, leaking of fluid and fetal movement were reviewed in detail with the patient. Please refer to After Visit Summary for other counseling recommendations.   Return in about 1 week (around 10/11/2022) for OFFICE OB VISIT (MD only).  Future Appointments  Date Time Provider Department Center  10/10/2022  8:35 AM [place] Bing, MD [facility]  10/17/2022  8:35 AM Reva Bores, MD [facility]  10/24/2022  9:35 AM [place] Large, Jethro Bastos, MD [facility]  10/31/2022  8:35 AM Reva Bores, MD [facility]    Jaynie Collins, MD

## 2022-10-04 NOTE — Patient Instructions (Signed)
Return to office for any scheduled appointments. Call the office or go to the MAU at [facility] if: You begin to have strong, frequent contractions Your water breaks.  Sometimes it is a big gush of fluid, sometimes it is just a trickle that keeps getting your underwear wet or running down your legs You have vaginal bleeding.  It is normal to have a small amount of spotting if your cervix was checked.  You do not feel your baby moving like normal.  If you do not, get something to eat and drink and lay down and focus on feeling your baby move.   If your baby is still not moving like normal, you should call the office or go to MAU. Any other obstetric concerns.  

## 2022-10-04 NOTE — Progress Notes (Deleted)
   PRENATAL VISIT NOTE  Subjective:  Miranda Curtis is a 22 y.o. G4P1021 at [redacted]w[redacted]d being seen today for ongoing prenatal care.  She is currently monitored for the following issues for this {Blank single:19197::"high-risk","low-risk"} pregnancy and has Maternal varicella, non-immune; Vitamin B 12 deficiency; Supervision of other normal pregnancy, antepartum; Obesity in pregnancy; History of diabetes mellitus; History of bariatric surgery; Vitamin D deficiency; and Obesity (BMI 30.0-34.9) on their problem list.  Patient reports {sx:14538}.  Contractions: Irregular. Vag. Bleeding: None.  Movement: Present. Denies leaking of fluid.   The following portions of the patient's history were reviewed and updated as appropriate: allergies, current medications, past family history, past medical history, past social history, past surgical history and problem list.   Objective:   Vitals:   10/04/22 0952  BP: 109/72  Pulse: 97  Weight: 190 lb (86.2 kg)    Fetal Status: Fetal Heart Rate (bpm): 148   Movement: Present  Presentation: Vertex  General:  Alert, oriented and cooperative. Patient is in no acute distress.  Skin: Skin is warm and dry. No rash noted.   Cardiovascular: Normal heart rate noted  Respiratory: Normal respiratory effort, no problems with respiration noted  Abdomen: Soft, gravid, appropriate for gestational age.  Pain/Pressure: Present     Pelvic: {Blank single:19197::"Cervical exam performed in the presence of a chaperone","Cervical exam deferred"}        Extremities: Normal range of motion.     Mental Status: Normal mood and affect. Normal behavior. Normal judgment and thought content.   Assessment and Plan:  Pregnancy: [id number] at [redacted]w[redacted]d   {Blank single:19197::"Term","Preterm"} labor symptoms and general obstetric precautions including but not limited to vaginal bleeding, contractions, leaking of fluid and fetal movement were reviewed in detail with the patient. Please refer to  After Visit Summary for other counseling recommendations.   Return in about 1 week (around 10/11/2022) for OFFICE OB VISIT (MD only).  Future Appointments  Date Time Provider Department Center  10/10/2022  8:35 AM [place] Bing, MD [facility]  10/17/2022  8:35 AM Reva Bores, MD [facility]  10/24/2022  9:35 AM Macon Large, Jethro Bastos, MD [facility]  10/31/2022  8:35 AM Reva Bores, MD [facility]    Jaynie Collins, MD

## 2022-10-05 LAB — CERVICOVAGINAL ANCILLARY ONLY
Chlamydia: NEGATIVE
Comment: NEGATIVE
Comment: NORMAL
Neisseria Gonorrhea: NEGATIVE

## 2022-10-06 ENCOUNTER — Encounter: Payer: Self-pay | Admitting: Obstetrics & Gynecology

## 2022-10-06 DIAGNOSIS — O99820 Streptococcus B carrier state complicating pregnancy: Secondary | ICD-10-CM | POA: Insufficient documentation

## 2022-10-06 LAB — STREP GP B NAA: Strep Gp B NAA: POSITIVE — AB

## 2022-10-07 ENCOUNTER — Inpatient Hospital Stay (HOSPITAL_COMMUNITY)
Admission: AD | Admit: 2022-10-07 | Discharge: 2022-10-07 | Disposition: A | Discharge status: Home / Self Care | Payer: Medicaid Other | Attending: Obstetrics and Gynecology | Admitting: Obstetrics and Gynecology

## 2022-10-07 ENCOUNTER — Encounter (HOSPITAL_COMMUNITY): Payer: Self-pay | Admitting: Obstetrics and Gynecology

## 2022-10-07 ENCOUNTER — Other Ambulatory Visit: Payer: Self-pay

## 2022-10-07 DIAGNOSIS — O4703 False labor before 37 completed weeks of gestation, third trimester: Secondary | ICD-10-CM | POA: Insufficient documentation

## 2022-10-07 DIAGNOSIS — Z3A36 36 weeks gestation of pregnancy: Secondary | ICD-10-CM

## 2022-10-07 DIAGNOSIS — O479 False labor, unspecified: Secondary | ICD-10-CM | POA: Diagnosis not present

## 2022-10-07 LAB — URINALYSIS, ROUTINE W REFLEX MICROSCOPIC
Bilirubin Urine: NEGATIVE
Glucose, UA: NEGATIVE mg/dL
Hgb urine dipstick: NEGATIVE
Ketones, ur: 20 mg/dL — AB
Nitrite: NEGATIVE
Protein, ur: NEGATIVE mg/dL
Specific Gravity, Urine: 1.015 (ref 1.005–1.030)
pH: 6 (ref 5.0–8.0)

## 2022-10-07 NOTE — MAU Provider Note (Signed)
S: Patient is here for RN labor evaluation. Fetal tracing, vital signs, & chart reviewed   O:  Vitals:   10/07/22 1658 10/07/22 1709  BP: (!) 116/56 112/63  Pulse: (!) 116 (!) 117  Resp: 16   Temp: 98.4 F (36.9 C)   TempSrc: Oral    Results for orders placed or performed during the hospital encounter of 10/07/22 (from the past 24 hour(s))  Urinalysis, Routine w reflex microscopic -Urine, Clean Catch     Status: Abnormal   Collection Time: 10/07/22  5:16 PM  Result Value Ref Range   Color, Urine YELLOW YELLOW   APPearance HAZY (A) CLEAR   Specific Gravity, Urine 1.015 1.005 - 1.030   pH 6.0 5.0 - 8.0   Glucose, UA NEGATIVE NEGATIVE mg/dL   Hgb urine dipstick NEGATIVE NEGATIVE   Bilirubin Urine NEGATIVE NEGATIVE   Ketones, ur 20 (A) NEGATIVE mg/dL   Protein, ur NEGATIVE NEGATIVE mg/dL   Nitrite NEGATIVE NEGATIVE   Leukocytes,Ua TRACE (A) NEGATIVE   RBC / HPF 0-5 0 - 5 RBC/hpf   WBC, UA 0-5 0 - 5 WBC/hpf   Bacteria, UA RARE (A) NONE SEEN   Squamous Epithelial / HPF 11-20 0 - 5 /HPF   Mucus PRESENT     Dilation: 1 Effacement (%): Thick Cervical Position: Posterior Station: -3 Presentation: Vertex Exam by:: K. Cowher RN   FHR: 135 bpm, Mod Var, no Decels, 15x15 Accels UC: irregular   A: 1. False labor   2. [redacted] weeks gestation of pregnancy      P:  RN to discharge home in stable condition with return precautions & fetal kick counts  Judeth Horn, NP 10/07/2022  6:08 PM

## 2022-10-07 NOTE — MAU Note (Signed)
Miranda Curtis is a 22 y.o. at [redacted]w[redacted]d here in MAU reporting: ongoing ctxs that worsened last night and into today. States it feels like constant cramping with occasional abdominal tightness. Reports low mid stabbing back pain that comes and goes.  Denies any LOF or VB. Endorses +FM Denies any recent sexual intercourse and denies any urinary symptoms. Reports increase in white vaginal discharge that is normal for her.  SVE Thursday 1cm/thick   Onset of complaint: ongoing, worse today  Pain score: 6  Vitals:   10/07/22 1658  BP: (!) 116/56  Pulse: (!) 116  Resp: 16  Temp: 98.4 F (36.9 C)     FHT:160 Lab orders placed from triage:

## 2022-10-10 ENCOUNTER — Ambulatory Visit (INDEPENDENT_AMBULATORY_CARE_PROVIDER_SITE_OTHER): Payer: MEDICAID | Admitting: Obstetrics and Gynecology

## 2022-10-10 ENCOUNTER — Encounter: Payer: Self-pay | Admitting: Obstetrics and Gynecology

## 2022-10-10 VITALS — BP 117/73 | HR 96 | Wt 190.6 lb

## 2022-10-10 DIAGNOSIS — O99820 Streptococcus B carrier state complicating pregnancy: Secondary | ICD-10-CM

## 2022-10-10 DIAGNOSIS — E669 Obesity, unspecified: Secondary | ICD-10-CM

## 2022-10-10 DIAGNOSIS — Z3480 Encounter for supervision of other normal pregnancy, unspecified trimester: Secondary | ICD-10-CM

## 2022-10-10 DIAGNOSIS — Z3A36 36 weeks gestation of pregnancy: Secondary | ICD-10-CM

## 2022-10-10 DIAGNOSIS — Z9884 Bariatric surgery status: Secondary | ICD-10-CM

## 2022-10-10 DIAGNOSIS — O99210 Obesity complicating pregnancy, unspecified trimester: Secondary | ICD-10-CM

## 2022-10-10 DIAGNOSIS — E66811 Obesity, class 1: Secondary | ICD-10-CM

## 2022-10-10 DIAGNOSIS — Z8639 Personal history of other endocrine, nutritional and metabolic disease: Secondary | ICD-10-CM

## 2022-10-10 NOTE — Progress Notes (Signed)
CC: Contractions  Had some spotting last night

## 2022-10-10 NOTE — Progress Notes (Signed)
   PRENATAL VISIT NOTE  Subjective:  Miranda Curtis is a 22 y.o. G4P1021 at [redacted]w[redacted]d being seen today for ongoing prenatal care.  She is currently monitored for the following issues for this high-risk pregnancy and has Maternal varicella, non-immune; Vitamin B 12 deficiency; Supervision of other normal pregnancy, antepartum; Obesity in pregnancy; History of diabetes mellitus; History of bariatric surgery; Vitamin D deficiency; Obesity (BMI 30.0-34.9); and GBS (group B Streptococcus carrier), +RV culture, currently pregnant on their problem list.  Patient reports occasional contractions, some spotting last night, none today. Contractions: Irregular. Vag. Bleeding: None.  Movement: Present. Denies leaking of fluid.   The following portions of the patient's history were reviewed and updated as appropriate: allergies, current medications, past family history, past medical history, past social history, past surgical history and problem list.   Objective:   Vitals:   10/10/22 0852  BP: 117/73  Pulse: 96  Weight: 190 lb 9.6 oz (86.5 kg)    Fetal Status: Fetal Heart Rate (bpm): 140 Fundal Height: 37 cm Movement: Present  Presentation: Vertex  General:  Alert, oriented and cooperative. Patient is in no acute distress.  Skin: Skin is warm and dry. No rash noted.   Cardiovascular: Normal heart rate noted  Respiratory: Normal respiratory effort, no problems with respiration noted  Abdomen: Soft, gravid, appropriate for gestational age.  Pain/Pressure: Present     Pelvic: Cervical exam performed in the presence of a chaperone Dilation: 1 Effacement (%): 20 Station: Ballotable Spec exam negative  Extremities: Normal range of motion.  Edema: None  Mental Status: Normal mood and affect. Normal behavior. Normal judgment and thought content.   Assessment and Plan:  Pregnancy: G4P1021 at [redacted]w[redacted]d 1. [redacted] weeks gestation of pregnancy F/u re: birth control next visit  2. History of bariatric surgery No need  for future scans 6/26: 2652gm, 36%, afi 16.8, cephalic  3. GBS (group B Streptococcus carrier), +RV culture, currently pregnant D/w pt   Preterm labor symptoms and general obstetric precautions including but not limited to vaginal bleeding, contractions, leaking of fluid and fetal movement were reviewed in detail with the patient. Please refer to After Visit Summary for other counseling recommendations.   No follow-ups on file.  Future Appointments  Date Time Provider Department Center  10/17/2022  8:35 AM Reva Bores, MD [facility]  10/24/2022  9:35 AM Macon Large, Jethro Bastos, MD [facility]  10/31/2022  8:35 AM Reva Bores, MD [facility]    [place] Bing, MD

## 2022-10-12 ENCOUNTER — Encounter: Payer: Self-pay | Admitting: Obstetrics and Gynecology

## 2022-10-15 ENCOUNTER — Inpatient Hospital Stay (HOSPITAL_COMMUNITY)
Admission: AD | Admit: 2022-10-15 | Discharge: 2022-10-15 | Disposition: A | Discharge status: Home / Self Care | Payer: MEDICAID | Attending: Family Medicine | Admitting: Family Medicine

## 2022-10-15 ENCOUNTER — Inpatient Hospital Stay (HOSPITAL_COMMUNITY): Payer: MEDICAID

## 2022-10-15 ENCOUNTER — Encounter (HOSPITAL_COMMUNITY): Payer: Self-pay | Admitting: Family Medicine

## 2022-10-15 DIAGNOSIS — O368130 Decreased fetal movements, third trimester, not applicable or unspecified: Secondary | ICD-10-CM | POA: Insufficient documentation

## 2022-10-15 DIAGNOSIS — O99843 Bariatric surgery status complicating pregnancy, third trimester: Secondary | ICD-10-CM

## 2022-10-15 DIAGNOSIS — Z3A37 37 weeks gestation of pregnancy: Secondary | ICD-10-CM | POA: Insufficient documentation

## 2022-10-15 DIAGNOSIS — E669 Obesity, unspecified: Secondary | ICD-10-CM | POA: Diagnosis not present

## 2022-10-15 DIAGNOSIS — O99213 Obesity complicating pregnancy, third trimester: Secondary | ICD-10-CM

## 2022-10-15 DIAGNOSIS — Z3689 Encounter for other specified antenatal screening: Secondary | ICD-10-CM

## 2022-10-15 LAB — WET PREP, GENITAL
Clue Cells Wet Prep HPF POC: NONE SEEN
Trich, Wet Prep: NONE SEEN
WBC, Wet Prep HPF POC: >=10 — AB (ref <10)
Yeast Wet Prep HPF POC: NONE SEEN

## 2022-10-15 NOTE — MAU Note (Signed)
.  Miranda Curtis is a 22 y.o. at [redacted]w[redacted]d here in MAU reporting: having spotting since last week. Having ctx off and on all week . Pain is worse today in her back especially. Unable to sleep last night. Reports decreased fetal movement as well. 1.5cm dilated last week  Onset of complaint: last week Pain score: 7 Vitals:   10/15/22 1333  BP: 108/70  Pulse: (!) 113  Resp: 18  Temp: 98.1 F (36.7 C)     FHT:146 Lab orders placed from triage:  labor eval

## 2022-10-15 NOTE — MAU Provider Note (Signed)
History     CSN: [id number]  Arrival date and time: 10/15/22 1303   None     Chief Complaint  Patient presents with   Vaginal Bleeding   Contractions   HPI Patient seen for decreased fetal movement and spotting.  Spotting has been intermittent for the past week.  She is not having any spotting today, but she has only felt the baby move approximately 12-13 times over the past couple of hours.  No palliating provoking factors.  She called the office who sent her here for evaluation.  OB History     Gravida  4   Para  1   Term  1   Preterm      AB  2   Living  1      SAB  2   IAB      Ectopic      Multiple  0   Live Births  1           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-has resolved following surgery   Eczema    History of depression 11/18/2017   History of diabetes mellitus 04/11/2022   Missed abortion 04/07/2019    Past Surgical History:  Procedure Laterality Date   SLEEVE GASTROPLASTY  10/12/2021   UPPER GASTROINTESTINAL ENDOSCOPY N/A     Family History  Problem Relation Age of Onset   Diabetes Mother    Healthy Mother    Sickle cell trait Mother    Sickle cell trait Father    Rashes / Skin problems Father        eczema   Asthma Sister    Rashes / Skin problems Sister        eczema   Rashes / Skin problems Sister        eczema   Rashes / Skin problems Sister        eczema   Sickle cell anemia Sister    Rashes / Skin problems Sister        eczema   Autism Brother    Healthy Brother    Diabetes Maternal Grandmother    Hypertension Maternal Grandmother    Cancer Maternal Grandfather        prostate   Cancer Paternal Grandmother 52       lung   Healthy Paternal Grandfather    Ovarian cancer Neg Hx    Colon cancer Neg Hx    Breast  cancer Neg Hx     Social History   Tobacco Use   Smoking status: Never   Smokeless tobacco: Never  Vaping Use   Vaping Use: Never used  Substance Use Topics   Alcohol use: No   Drug use: No    Allergies:  Allergies  Allergen Reactions   Metoclopramide Other (See Comments)    "Severe panic attack" "Severe panic attack" "Severe panic attack"    Medications Prior to Admission  Medication Sig Dispense Refill Last Dose   acetaminophen (TYLENOL) 325 MG tablet Take 2 tablets (650 mg total) by mouth every 4 (four) hours as needed for up to 30 doses for moderate pain. 60 tablet 0    aspirin EC 81 MG tablet Take 1 tablet (81 mg total) by mouth daily. Take after 12 weeks for prevention of preeclampsia  later in pregnancy 300 tablet 2    cyclobenzaprine (FLEXERIL) 10 MG tablet Take 1 tablet (10 mg total) by mouth every 8 (eight) hours as needed for muscle spasms. 30 tablet 1    Prenatal Vit-Fe Fumarate-FA (PRENATAL VITAMIN PO) Take 1 tablet by mouth daily at 6 (six) AM.      Vitamin D, Ergocalciferol, (DRISDOL) 1.25 MG (50000 UNIT) CAPS capsule Take 1 capsule (50,000 Units total) by mouth every 7 (seven) days. 12 capsule 0     Review of Systems Physical Exam   Blood pressure 108/70, pulse (!) 113, temperature 98.1 F (36.7 C), resp. rate 18, height 5\' 1"  (1.549 m), weight 86.2 kg, last menstrual period 01/25/2022, unknown if currently breastfeeding.  Physical Exam Vitals reviewed.  Constitutional:      Appearance: Normal appearance.  Abdominal:     General: Abdomen is flat.     Palpations: Abdomen is soft.  Skin:    Capillary Refill: Capillary refill takes less than 2 seconds.  Neurological:     General: No focal deficit present.     Mental Status: She is alert.  Psychiatric:        Mood and Affect: Mood normal.        Behavior: Behavior normal.        Thought Content: Thought content normal.        Judgment: Judgment normal.   Dilation: 2 Effacement (%):  50 Presentation: Vertex Exam by:: Dr. Adrian Blackwater   MAU Course  Procedures NST: Baseline: 130  Variability: moderate Accelerations: present  Decelerations: none Contractions: none  BPP done - 8/8  MDM   Assessment and Plan   1. [redacted] weeks gestation of pregnancy   2. Decreased fetal movements in third trimester, single or unspecified fetus   3. NST (non-stress test) reactive    Discharge to home.  No vaginal infection on wet prep. Spotting likely due to contractions.  Levie Heritage 10/15/2022, 2:30 PM

## 2022-10-16 LAB — GC/CHLAMYDIA PROBE AMP (~~LOC~~) NOT AT ARMC
Chlamydia: NEGATIVE
Comment: NEGATIVE
Comment: NORMAL
Neisseria Gonorrhea: NEGATIVE

## 2022-10-17 ENCOUNTER — Ambulatory Visit: Payer: MEDICAID | Admitting: Family Medicine

## 2022-10-17 VITALS — BP 110/73 | HR 98 | Wt 188.0 lb

## 2022-10-17 DIAGNOSIS — Z3480 Encounter for supervision of other normal pregnancy, unspecified trimester: Secondary | ICD-10-CM

## 2022-10-17 DIAGNOSIS — O99820 Streptococcus B carrier state complicating pregnancy: Secondary | ICD-10-CM

## 2022-10-17 NOTE — Progress Notes (Signed)
   PRENATAL VISIT NOTE  Subjective:  Miranda Curtis is a 22 y.o. G4P1021 at [redacted]w[redacted]d being seen today for ongoing prenatal care.  She is currently monitored for the following issues for this low-risk pregnancy and has Maternal varicella, non-immune; Vitamin B 12 deficiency; Supervision of other normal pregnancy, antepartum; Obesity in pregnancy; History of bariatric surgery; Vitamin D deficiency; Obesity (BMI 30.0-34.9); and GBS (group B Streptococcus carrier), +RV culture, currently pregnant on their problem list.  Patient reports backache.  Contractions: Irregular. Vag. Bleeding: Scant.  Movement: Present. Denies leaking of fluid.   The following portions of the patient's history were reviewed and updated as appropriate: allergies, current medications, past family history, past medical history, past social history, past surgical history and problem list.   Objective:   Vitals:   10/17/22 0849  BP: 110/73  Pulse: 98  Weight: 188 lb (85.3 kg)    Fetal Status: Fetal Heart Rate (bpm): 142 Fundal Height: 37 cm Movement: Present  Presentation: Vertex  General:  Alert, oriented and cooperative. Patient is in no acute distress.  Skin: Skin is warm and dry. No rash noted.   Cardiovascular: Normal heart rate noted  Respiratory: Normal respiratory effort, no problems with respiration noted  Abdomen: Soft, gravid, appropriate for gestational age.  Pain/Pressure: Present     Pelvic: Cervical exam performed in the presence of a chaperone Dilation: 2 Effacement (%): 40 Station: -2  Extremities: Normal range of motion.  Edema: None  Mental Status: Normal mood and affect. Normal behavior. Normal judgment and thought content.   Assessment and Plan:  Pregnancy: G4P1021 at [redacted]w[redacted]d 1. Supervision of other normal pregnancy, antepartum Continue routine prenatal care. Consider membrane sweep @ 39 weeks  2. GBS (group B Streptococcus carrier), +RV culture, currently pregnant Will need treatment in  labor  Preterm labor symptoms and general obstetric precautions including but not limited to vaginal bleeding, contractions, leaking of fluid and fetal movement were reviewed in detail with the patient. Please refer to After Visit Summary for other counseling recommendations.   Return in 1 week (on 10/24/2022).  Future Appointments  Date Time Provider Department Center  10/24/2022  9:35 AM Anyanwu, Jethro Bastos, MD [facility]  10/31/2022  8:35 AM Reva Bores, MD [facility]    Reva Bores, MD

## 2022-10-17 NOTE — Progress Notes (Signed)
CC: Contractions all night on and off  Spotting on and off    Wants cervix checked was about 2 cm at [facility]

## 2022-10-22 ENCOUNTER — Encounter (HOSPITAL_COMMUNITY): Payer: Self-pay | Admitting: Family Medicine

## 2022-10-22 ENCOUNTER — Inpatient Hospital Stay (HOSPITAL_COMMUNITY): Payer: MEDICAID

## 2022-10-22 ENCOUNTER — Inpatient Hospital Stay (HOSPITAL_COMMUNITY)
Admission: AD | Admit: 2022-10-22 | Discharge: 2022-10-22 | Disposition: A | Discharge status: Home / Self Care | Payer: MEDICAID | Source: Home / Self Care | Attending: Family Medicine | Admitting: Family Medicine

## 2022-10-22 DIAGNOSIS — Z3689 Encounter for other specified antenatal screening: Secondary | ICD-10-CM | POA: Insufficient documentation

## 2022-10-22 DIAGNOSIS — Z3A38 38 weeks gestation of pregnancy: Secondary | ICD-10-CM | POA: Insufficient documentation

## 2022-10-22 DIAGNOSIS — O368130 Decreased fetal movements, third trimester, not applicable or unspecified: Secondary | ICD-10-CM

## 2022-10-22 DIAGNOSIS — O99843 Bariatric surgery status complicating pregnancy, third trimester: Secondary | ICD-10-CM | POA: Insufficient documentation

## 2022-10-22 MED ORDER — ONDANSETRON HCL 4 MG/2ML IJ SOLN
4.0000 mg | Freq: Once | INTRAMUSCULAR | Status: AC
  Administered 2022-10-22: 4 mg via INTRAVENOUS
  Filled 2022-10-22: qty 2

## 2022-10-22 MED ORDER — ACETAMINOPHEN 325 MG PO TABS
650.0000 mg | ORAL_TABLET | Freq: Once | ORAL | Status: AC
  Administered 2022-10-22: 650 mg via ORAL
  Filled 2022-10-22: qty 2

## 2022-10-22 MED ORDER — LACTATED RINGERS IV BOLUS
1000.0000 mL | Freq: Once | INTRAVENOUS | Status: AC
  Administered 2022-10-22: 1000 mL via INTRAVENOUS

## 2022-10-22 NOTE — Discharge Instructions (Signed)

## 2022-10-22 NOTE — MAU Note (Addendum)
History  Miranda Curtis is a 22 year old female G4P1021 at [redacted]w[redacted]d gestation w/ PMHx of gastric sleeve, ADHD, MDD who presents to the MAU for decreased fetal movements and lower abdominal cramping. The patient says that she felt typical fetal movement last night, but between waking up at 9:50 am and arrival she only noticed 2 fetal movements. She has had waxing and waning lower abdominal discomfort for the past week, but it has become constant this morning. The patient has a history of Braxton Hicks contractions, but says that these feel slightly different and more constant in nature. She notes a new, mild frontal headache as well. The patient has not taken any tylenol as she ran out and did not fill her prior prescription of flexeril. She lastly notes some spotting of vaginal blood over the past two weeks that was very light and wiped away easily, but none in the past 3 days. She has some clear to off-white discharge over the past week or two that is slightly increasing with time. The patient has one sexual partner that has been consistent and is not concerned for STI's. She denies vaginal discomfort or irritation/itching, leg swelling, upper abdominal pain. Her prior two miscarriages were without a known reason. Her living child was through vaginal birth and was uncomplicated. She reports no complications with her current pregnancy. The patient does not take any prescribed medications and is only known to be allergic to metoclopramide. She had a history of diabetes, but this resolved after gastric sleeve surgery.   Chief Complaint  Patient presents with   Decreased Fetal Movement   Abdominal Pain    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-has resolved following surgery   Eczema    History of depression 11/18/2017    History of diabetes mellitus 04/11/2022   Missed abortion 04/07/2019    Past Surgical History:  Procedure Laterality Date   SLEEVE GASTROPLASTY  10/12/2021   UPPER GASTROINTESTINAL ENDOSCOPY N/A     Family History  Problem Relation Age of Onset   Diabetes Mother    Healthy Mother    Sickle cell trait Mother    Sickle cell trait Father    Rashes / Skin problems Father        eczema   Asthma Sister    Rashes / Skin problems Sister        eczema   Rashes / Skin problems Sister        eczema   Rashes / Skin problems Sister        eczema   Sickle cell anemia Sister    Rashes / Skin problems Sister        eczema   Autism Brother    Healthy Brother    Diabetes Maternal Grandmother    Hypertension Maternal Grandmother    Cancer Maternal Grandfather        prostate   Cancer Paternal Grandmother 77       lung   Healthy Paternal Grandfather    Ovarian cancer Neg Hx    Colon cancer Neg Hx    Breast cancer Neg Hx     Social History   Tobacco Use   Smoking status: Never   Smokeless tobacco: Never  Vaping Use   Vaping status: Never Used  Substance Use Topics  Alcohol use: No   Drug use: No    Allergies:  Allergies  Allergen Reactions   Metoclopramide Other (See Comments)    "Severe panic attack" "Severe panic attack" "Severe panic attack"    Medications Prior to Admission  Medication Sig Dispense Refill Last Dose   aspirin EC 81 MG tablet Take 1 tablet (81 mg total) by mouth daily. Take after 12 weeks for prevention of preeclampsia later in pregnancy 300 tablet 2 Past Month   cyclobenzaprine (FLEXERIL) 10 MG tablet Take 1 tablet (10 mg total) by mouth every 8 (eight) hours as needed for muscle spasms. 30 tablet 1 Past Month   Prenatal Vit-Fe Fumarate-FA (PRENATAL VITAMIN PO) Take 1 tablet by mouth daily at 6 (six) AM.   Past Month   Vitamin D, Ergocalciferol, (DRISDOL) 1.25 MG (50000 UNIT) CAPS capsule Take 1 capsule (50,000 Units total) by mouth every 7 (seven)  days. 12 capsule 0 Past Month   acetaminophen (TYLENOL) 325 MG tablet Take 2 tablets (650 mg total) by mouth every 4 (four) hours as needed for up to 30 doses for moderate pain. 60 tablet 0     Review of Systems  Constitutional:  Negative for activity change and appetite change.  Respiratory:  Negative for shortness of breath.   Cardiovascular:  Negative for chest pain and leg swelling.  Gastrointestinal:  Positive for abdominal pain (diffuse lower). Negative for diarrhea, nausea and vomiting.  Genitourinary:  Negative for dysuria, frequency and hematuria.       Vaginal spotting of blood, slightly increased discharge  Musculoskeletal:  Negative for arthralgias.  Neurological:  Positive for headaches.   Physical Exam Blood pressure 112/66, pulse (!) 115, temperature 98.5 F (36.9 C), temperature source Oral, resp. rate 20, height 5\' 1"  (1.549 m), weight 86.4 kg, last menstrual period 01/25/2022, SpO2 99%, unknown if currently breastfeeding.  Physical Exam Constitutional:      General: She is not in acute distress.    Appearance: Normal appearance. She is not ill-appearing or diaphoretic.  Cardiovascular:     Rate and Rhythm: Tachycardia present.     Pulses: Normal pulses.     Heart sounds: Normal heart sounds.  Pulmonary:     Effort: Pulmonary effort is normal.     Breath sounds: Normal breath sounds.  Abdominal:     Tenderness: There is abdominal tenderness (diffuse lower abdomen, mild TTP). There is no guarding or rebound.     Comments: Gravid abdomen  Neurological:     General: No focal deficit present.     Mental Status: She is alert and oriented to person, place, and time.  Skin:    General: Skin is warm and dry.     Capillary Refill: Capillary refill takes less than 2 seconds.     Findings: No bruising, erythema or rash.  Psychiatric:        Mood and Affect: Mood normal.        Behavior: Behavior normal.        Thought Content: Thought content normal.        Judgment:  Judgment normal.  Dilation: 1 Effacement (%): Thick Cervical Position: Posterior Station: Ballotable Exam by:: Holly Flippin RN  MAU Course Procedures  MDM Miranda Curtis is a 22 year old female G4P1021 at [redacted]w[redacted]d gestation w/ PMHx of GBS+, gastric sleeve, ADHD, MDD who presents to the MAU for decreased fetal movements this morning only, diffuse lower abdominal cramping today, and a mild frontal headache. On examination she is tachycardic and has  very mild diffuse lower abdominal tenderness. The patient is recording fetal movements by clicker. I suspect that her vaginal discharge and lower abdominal cramping are as expected for this stage of pregnancy. Her headache fits the clinical picture of a tension headache since it is bilateral and mild. As for decreased fetal movements in the context of 2 spontaneous abortions, will obtain BPP to evaluate for fetal well being.  Decreased Fetal Movements Reported 2 over a 4 hour span without the usage of her hands to feel for movement. Fetal Heart Tracing has been Category 1: 135 bpm baseline, Moderate variability with accelerations, no decels present. Biophysical Profile and toco both show normal fetal activity at a regular frequency. -Discussed how to measure fetal movements at home -Discussed return precautions  Abdominal Pain  Discharge Given the gradual change in discharge without accompanying irritation signs, I believe that this can be as expected for this stage of pregnancy. Her abdominal pain is diffuse and has also gradually onset around the [redacted] week gestation mark without overt/significant or localized tenderness. Given the constant nature, I do not believe these are her braxton hicks contractions. I suspect that these are normal for pregnancy and not related to pathologic etiology, nor is it related to labor. -Discussed expected discomfort level for pregnancy -Tylenol PRN, could fill her flexeril prescription (09/20/22)  Headache Could be stress related  or idiopathic, but this is likely a tension headache as it is frontal and bilateral. -Acetaminophen PRN for headaches   1. [redacted] weeks gestation of pregnancy   2. Decreased fetal movements in third trimester, single or unspecified fetus   3. NST (non-stress test) reactive    Patient has anterior placenta and may not be feeling all of the baby's movements. WHile the patient was here, I could hear baby movements on the doppler, but the patient was unaware of the movements. BPP 8/8. NST reactive.  Headache improved. Discharge to home.  Levie Heritage, DO 10/22/2022 5:12 PM

## 2022-10-22 NOTE — MAU Note (Signed)
Miranda Curtis is a 22 y.o. at [redacted]w[redacted]d here in MAU reporting: hadn't felt any movement this morning.  Did feel a couple 'twinges' on the way here. Has been cramping all week, started to get really intense last night.  No bleeding, doesn't think she is leaking, pants have been damp- reports increased discharge.  Onset of complaint: last night Pain score: 7 Vitals:   10/22/22 1322  BP: 112/66  Pulse: (!) 115  Resp: 20  Temp: 98.5 F (36.9 C)  SpO2: 99%     FHT:144 Lab orders placed from triage:

## 2022-10-24 ENCOUNTER — Ambulatory Visit: Payer: MEDICAID | Admitting: Obstetrics & Gynecology

## 2022-10-24 VITALS — BP 117/79 | HR 86 | Wt 192.0 lb

## 2022-10-24 DIAGNOSIS — Z3A38 38 weeks gestation of pregnancy: Secondary | ICD-10-CM

## 2022-10-24 DIAGNOSIS — Z3480 Encounter for supervision of other normal pregnancy, unspecified trimester: Secondary | ICD-10-CM

## 2022-10-24 DIAGNOSIS — O368130 Decreased fetal movements, third trimester, not applicable or unspecified: Secondary | ICD-10-CM

## 2022-10-24 DIAGNOSIS — O99820 Streptococcus B carrier state complicating pregnancy: Secondary | ICD-10-CM

## 2022-10-24 NOTE — Progress Notes (Signed)
CC: Want cervix checked today

## 2022-10-24 NOTE — H&P (Incomplete)
OBSTETRIC ADMISSION HISTORY AND PHYSICAL  Miranda Curtis is a 22 y.o. female [phone] with IUP at [redacted]w[redacted]d by LMP presenting for IOL. She reports +FMs. She plans on EBM feeding. She is undecided about birth control. She received her prenatal care at  [facility]    Dating: By LMP --->  Estimated Date of Delivery: 11/01/22  Sono:    @[redacted]w[redacted]d , CWD, normal anatomy, cephalic presentation, anterior placenta, 2652g, 36% EFW   Prenatal History/Complications:  -GBS+ -Hx of bariatric sx -Varicella NI  Past Medical History: Past Medical History:  Diagnosis Date  . Anovulation 06/08/2020  . Anxiety   . Attention deficit hyperactivity disorder (ADHD)   . Depression   . Diabetes mellitus without complication (HCC)    states prior to gastric sleeve surgery-has resolved following surgery  . Eczema   . History of depression 11/18/2017  . History of diabetes mellitus 04/11/2022  . Missed abortion 04/07/2019    Past Surgical History: Past Surgical History:  Procedure Laterality Date  . SLEEVE GASTROPLASTY  10/12/2021  . UPPER GASTROINTESTINAL ENDOSCOPY N/A     Obstetrical History: OB History     Gravida  4   Para  1   Term  1   Preterm      AB  2   Living  1      SAB  2   IAB      Ectopic      Multiple  0   Live Births  1           Social History Social History   Socioeconomic History  . Marital status: Single    Spouse name: Not on file  . Number of children: 1  . Years of education: 9  . Highest education level: Not on file  Occupational History  . Occupation: Consulting civil engineer  . Occupation: CNA  Tobacco Use  . Smoking status: Never  . Smokeless tobacco: Never  Vaping Use  . Vaping status: Never Used  Substance and Sexual Activity  . Alcohol use: No  . Drug use: No  . Sexual activity: Yes    Partners: Male    Birth control/protection: None    Comment: last SI Friday AM 09/14/2022  Other Topics Concern  . Not on file  Social History Narrative  . Not on file    Social Determinants of Health   Financial Resource Strain: Low Risk  (03/19/2022)   Overall Financial Resource Strain (CARDIA)   . Difficulty of Paying Living Expenses: Not hard at all  Food Insecurity: No Food Insecurity (03/19/2022)   Hunger Vital Sign   . Worried About Programme researcher, broadcasting/film/video in the Last Year: Never true   . Ran Out of Food in the Last Year: Never true  Transportation Needs: No Transportation Needs (03/19/2022)   PRAPARE - Transportation   . Lack of Transportation (Medical): No   . Lack of Transportation (Non-Medical): No  Physical Activity: Inactive (03/19/2022)   Exercise Vital Sign   . Days of Exercise per Week: 0 days   . Minutes of Exercise per Session: 0 min  Stress: No Stress Concern Present (03/19/2022)   [employer] of Occupational Health - Occupational Stress Questionnaire   . Feeling of Stress : Not at all  Social Connections: Unknown (03/19/2022)   Social Connection and Isolation Panel [NHANES]   . Frequency of Communication with Friends and Family: More than three times a week   . Frequency of Social Gatherings with Friends and Family: More than three  times a week   . Attends Religious Services: Never   . Active Member of Clubs or Organizations: No   . Attends Banker Meetings: Never   . Marital Status: Not on file    Family History: Family History  Problem Relation Age of Onset  . Diabetes Mother   . Healthy Mother   . Sickle cell trait Mother   . Sickle cell trait Father   . Rashes / Skin problems Father        eczema  . Asthma Sister   . Rashes / Skin problems Sister        eczema  . Rashes / Skin problems Sister        eczema  . Rashes / Skin problems Sister        eczema  . Sickle cell anemia Sister   . Rashes / Skin problems Sister        eczema  . Autism Brother   . Healthy Brother   . Diabetes Maternal Grandmother   . Hypertension Maternal Grandmother   . Cancer Maternal Grandfather        prostate  .  Cancer Paternal Grandmother 14       lung  . Healthy Paternal Grandfather   . Ovarian cancer Neg Hx   . Colon cancer Neg Hx   . Breast cancer Neg Hx     Allergies: Allergies  Allergen Reactions  . Metoclopramide Other (See Comments)    "Severe panic attack" "Severe panic attack" "Severe panic attack"    No medications prior to admission.     Review of Systems   All systems reviewed and negative except as stated in HPI  Last menstrual period 01/25/2022, unknown if currently breastfeeding. General appearance: alert and no distress Lungs: normal effort Heart: regular rate noted Abdomen: gravid Extremities: No LE edema Presentation: cephalic Fetal monitoringBaseline: *** bpm, Variability: {fhr variability:31519}, Accelerations: {fhr accel present:31520}, and Decelerations: {FHR DECEL PRESENT:31526} Uterine activity{Uterine contractions:31516}     Prenatal labs: ABO, Rh: AB/Positive/-- (12/27 1117) Antibody: Negative (12/27 1117) Rubella: 1.21 (12/27 1117) RPR: Non Reactive (05/10 0932)  HBsAg: Negative (12/27 1117)  HIV: Non Reactive (05/10 0932)  GBS: Positive/-- (06/27 1000)  1 hr Glucola *** Genetic screening  *** Anatomy US ***  Prenatal Transfer Tool  Maternal Diabetes: {Maternal Diabetes:3043596} Genetic Screening: {Genetic Screening:20205} Maternal Ultrasounds/Referrals: {Maternal Ultrasounds / Referrals:20211} Fetal Ultrasounds or other Referrals:  {Fetal Ultrasounds or Other Referrals:20213} Maternal Substance Abuse:  {Maternal Substance Abuse:20223} Significant Maternal Medications:  {Significant Maternal Meds:20233} Significant Maternal Lab Results:  {Significant Maternal Lab Results:20235} Number of Prenatal Visits:{Prenatal Visits:27860} Other Comments:  {Other Comments:20251}  No results found for this or any previous visit (from the past 24 hour(s)).  Patient Active Problem List   Diagnosis Date Noted  . GBS (group B Streptococcus carrier),  +RV culture, currently pregnant 10/06/2022  . Vitamin D deficiency 05/09/2022  . Obesity (BMI 30.0-34.9) 05/09/2022  . Obesity in pregnancy 04/11/2022  . History of bariatric surgery 04/11/2022  . Supervision of other normal pregnancy, antepartum 03/19/2022  . Vitamin B 12 deficiency 10/26/2017  . Maternal varicella, non-immune 05/09/2017    Assessment/Plan:  Ranada Vigorito is a 22 y.o. [id number] at [redacted]w[redacted]d here for***  #Labor:*** #Pain: *** #FWB: *** #ID:  *** #MOF: *** #MOC:*** #Circ:  ***  Jadalynn Burr Autry-Lott, DO  10/25/2022, 12:00 AM

## 2022-10-24 NOTE — Progress Notes (Signed)
PRENATAL VISIT NOTE  Subjective:  Miranda Curtis is a 22 y.o. G4P1021 at [redacted]w[redacted]d being seen today for ongoing prenatal care.  She is currently monitored for the following issues for this low-risk pregnancy and has Maternal varicella, non-immune; Vitamin B 12 deficiency; Supervision of other normal pregnancy, antepartum; Obesity in pregnancy; History of bariatric surgery; Vitamin D deficiency; Obesity (BMI 30.0-34.9); and GBS (group B Streptococcus carrier), +RV culture, currently pregnant on their problem list.  Patient reports occasional contractions.  Contractions: Irregular. Vag. Bleeding: None.  Movement: Present. Denies leaking of fluid. Had multiple MAU visits for decreased fetal movement, last one on 10/22/22.  She feels it is a little improved today but still concerned. MFM recommended IOL at 39 weeks.   The following portions of the patient's history were reviewed and updated as appropriate: allergies, current medications, past family history, past medical history, past social history, past surgical history and problem list.   Objective:   Vitals:   10/24/22 0944  BP: 117/79  Pulse: 86  Weight: 192 lb (87.1 kg)    Fetal Status: Fetal Heart Rate (bpm): 137   Movement: Present  Presentation: Vertex  General:  Alert, oriented and cooperative. Patient is in no acute distress.  Skin: Skin is warm and dry. No rash noted.   Cardiovascular: Normal heart rate noted  Respiratory: Normal respiratory effort, no problems with respiration noted  Abdomen: Soft, gravid, appropriate for gestational age.  Pain/Pressure: Present     Pelvic: Cervical exam performed in the presence of a chaperone Dilation: 4 Effacement (%): 70 Station: -2  Extremities: Normal range of motion.  Edema: None  Mental Status: Normal mood and affect. Normal behavior. Normal judgment and thought content.   [place] MFM FETAL BPP WO NON STRESS  Result Date:  10/22/2022 ----------------------------------------------------------------------  OBSTETRICS REPORT                       (Signed Final 10/22/2022 04:34 pm) ---------------------------------------------------------------------- Patient Info  ID #:       [id number]                          D.O.B.:  2000-12-26 (22 yrs)  Name:       Miranda Curtis                    Visit Date: 10/22/2022 02:46 pm ---------------------------------------------------------------------- Performed By  Attending:        Noralee Space MD        Secondary Phy.:   Levie Heritage  Performed By:     Marcellina Millin          [facility]  Referred By:      [facility] MAU/Triage ---------------------------------------------------------------------- Orders  #  Description                           Code        Ordered By  1  [place] MFM FETAL BPP WO NON  16109.60    JACOB STINSON     STRESS ----------------------------------------------------------------------  #  Order #                     Accession #                Episode #  1  [id number]                   [id number]                 [id number] ---------------------------------------------------------------------- Indications  Decreased fetal movement                       O36.8190  [redacted] weeks gestation of pregnancy                Z3A.38 ---------------------------------------------------------------------- Fetal Evaluation  Num Of Fetuses:         1  Fetal Heart Rate(bpm):  128  Cardiac Activity:       Observed  Presentation:           Cephalic  Placenta:               Anterior  Amniotic Fluid  AFI FV:      Within normal limits  AFI Sum(cm)     %Tile       Largest Pocket(cm)  15.5            61          6.6  RUQ(cm)       RLQ(cm)       LUQ(cm)        LLQ(cm)  6.6           2.8           4.5            1.6 ---------------------------------------------------------------------- Biophysical Evaluation  Amniotic F.V:    Within normal limits       F. Tone:        Observed  F. Movement:    Observed                   Score:          8/8  F. Breathing:   Observed ---------------------------------------------------------------------- OB History  Gravidity:    4         Term:   1         SAB:   2  Living:       1 ---------------------------------------------------------------------- Gestational Age  LMP:           38w 4d        Date:  01/25/22                 EDD:   11/01/22  Best:          38w 4d     Det. By:  LMP  (01/25/22)          EDD:   11/01/22 ---------------------------------------------------------------------- Anatomy  Cranium:               Appears normal         Kidneys:                Appear normal  Stomach:               Appears normal, left   Bladder:  Appears normal                         sided  Abdomen:               Appears normal ---------------------------------------------------------------------- Impression  Patient was evaluated at the MAU for complaints of  decreased fetal movements.  Amniotic fluid is normal and good fetal activity is seen  .Antenatal testing is reassuring. BPP 8/8.  Cephalic  presentation.  This is the second episode of decreased fetal movements.  Antenatal testing has been reassuring.  Patient has a prenatal visit appointment in 2 days. ---------------------------------------------------------------------- Recommendations  Recommend delivery at [redacted] weeks gestation. ----------------------------------------------------------------------                 Noralee Space, MD Electronically Signed Final Report   10/22/2022 04:34 pm ----------------------------------------------------------------------   [place] MFM FETAL BPP WO NON STRESS  Result Date: 10/16/2022 ----------------------------------------------------------------------  OBSTETRICS REPORT                       (Signed Final 10/16/2022 11:01 am) ---------------------------------------------------------------------- Patient Info   ID #:       [id number]                          D.O.B.:  Aug 29, 2000 (22 yrs)  Name:       Miranda Curtis                    Visit Date: 10/15/2022 02:39 pm ---------------------------------------------------------------------- Performed By  Attending:        Lin Landsman      Referred By:      [facility] MAU/Triage                    MD  Performed By:     Percell Boston          [facility] ---------------------------------------------------------------------- Orders  #  Description                           Code        Ordered By  1  [place] MFM FETAL BPP WO NON               08657.84    JACOB STINSON     STRESS ----------------------------------------------------------------------  #  Order #                     Accession #                Episode #  1  [id number]                   [id number]                 [id number] ---------------------------------------------------------------------- Indications  [redacted] weeks gestation of pregnancy                Z3A.37  Decreased fetal movements, third trimester,    K74.2595  unspecified  Pregnancy complicated by previous gastric      O99.843  bypass, antepartum, third trimester  Obesity complicating pregnancy, third          O99.213  trimester (PG BMI 36)  Fetal or maternal indication (History of       O35.8XX0  bariatric surgery)  LR NIPS/ Neg AFP ---------------------------------------------------------------------- Fetal Evaluation  Num Of Fetuses:         1  Fetal Heart Rate(bpm):  123  Cardiac Activity:       Observed  Presentation:           Cephalic  Placenta:               Anterior  P. Cord Insertion:      Previously visualized  Amniotic Fluid  AFI FV:      Within normal limits  AFI Sum(cm)     %Tile       Largest Pocket(cm)  16.6            64          5  RUQ(cm)       RLQ(cm)       LUQ(cm)        LLQ(cm)  5             3.2           4.5            3.9  ---------------------------------------------------------------------- Biophysical Evaluation  Amniotic F.V:   Within normal limits       F. Tone:        Observed  F. Movement:    Observed                   Score:          8/8  F. Breathing:   Observed ---------------------------------------------------------------------- OB History  Gravidity:    4         Term:   1         SAB:   2  Living:       1 ---------------------------------------------------------------------- Gestational Age  LMP:           37w 4d        Date:  01/25/22                 EDD:   11/01/22  Best:          37w 4d     Det. By:  LMP  (01/25/22)          EDD:   11/01/22 ---------------------------------------------------------------------- Anatomy  Cranium:               Appears normal         Stomach:                Appears normal, left                                                                        sided  Thoracic:              Appears normal         Bladder:  Appears normal  Diaphragm:             Appears normal ---------------------------------------------------------------------- Cervix Uterus Adnexa  Cervix  Not visualized (advanced GA >24wks)  Uterus  No abnormality visualized.  Right Ovary  No adnexal mass visualized.  Left Ovary  No adnexal mass visualized.  Cul De Sac  No free fluid seen.  Adnexa  No abnormality visualized ---------------------------------------------------------------------- Impression  Antenatal testing performed given maternal decreased fetal  movement.  The biophysical profile was 8/8 with good fetal movement and  amniotic fluid volume. ---------------------------------------------------------------------- Recommendations  Clinical correlation recommended. ----------------------------------------------------------------------              Lin Landsman, MD Electronically Signed Final Report   10/16/2022 11:01 am ----------------------------------------------------------------------  [place] MFM OB  FOLLOW UP  Result Date: 10/03/2022 ----------------------------------------------------------------------  OBSTETRICS REPORT                       (Signed Final 10/03/2022 09:12 am) ---------------------------------------------------------------------- Patient Info  ID #:       [id number]                          D.O.B.:  2001/04/01 (22 yrs)  Name:       Miranda Curtis                    Visit Date: 10/03/2022 08:27 am ---------------------------------------------------------------------- Performed By  Attending:        Noralee Space MD        Referred By:      [facility] MAU/Triage  Performed By:     Palma Holter         [facility] ---------------------------------------------------------------------- Orders  #  Description                           Code        Ordered By  1  [place] MFM OB FOLLOW UP                   62130.86    RAVI SHANKAR ----------------------------------------------------------------------  #  Order #                     Accession #  Episode #  1  [id number]                   [id number]                 [id number] ---------------------------------------------------------------------- Indications  Pregnancy complicated by previous gastric      O99.843  bypass, antepartum, third trimester  Vaginal bleeding in pregnancy, third trimester O46.93  Obesity complicating pregnancy, third          O99.213  trimester (PG BMI 36)  Fetal or maternal indication (History of       O35.8XX0  bariatric surgery)  [redacted] weeks gestation of pregnancy                Z3A.35  LR NIPS/ Neg AFP ---------------------------------------------------------------------- Vital Signs  BP:          120/62 ---------------------------------------------------------------------- Fetal Evaluation  Num  Of Fetuses:         1  Fetal Heart Rate(bpm):  137  Cardiac Activity:       Observed  Presentation:           Cephalic  Placenta:               Anterior  P. Cord Insertion:      Visualized, central  Amniotic Fluid  AFI FV:      Within normal limits  AFI Sum(cm)     %Tile       Largest Pocket(cm)  16.8            62          6.04  RUQ(cm)       RLQ(cm)       LUQ(cm)        LLQ(cm)  6.04          3.04          3.52           4.2 ---------------------------------------------------------------------- Biometry  BPD:      88.8  mm     G. Age:  35w 6d         59  %    CI:        76.02   %    70 - 86                                                          FL/HC:      21.3   %    20.1 - 22.1  HC:      322.8  mm     G. Age:  36w 3d         33  %    HC/AC:      1.04        0.93 - 1.11  AC:      310.6  mm     G. Age:  35w 0d         34  %    FL/BPD:     77.5   %    71 - 87  FL:       68.8  mm     G. Age:  35w 2d         31  %    FL/AC:      22.2   %  20 - 24  LV:        5.3  mm  Est. FW:    2652  gm    5 lb 14 oz      36  % ---------------------------------------------------------------------- OB History  Gravidity:    4         Term:   1         SAB:   2  Living:       1 ---------------------------------------------------------------------- Gestational Age  LMP:           35w 6d        Date:  01/25/22                 EDD:   11/01/22  U/S Today:     35w 5d                                        EDD:   11/02/22  Best:          35w 6d     Det. By:  LMP  (01/25/22)          EDD:   11/01/22 ---------------------------------------------------------------------- Anatomy  Cranium:               Appears normal         LVOT:                   Previously seen  Cavum:                 Appears normal         Aortic Arch:            Previously seen  Ventricles:            Appears normal         Ductal Arch:            Previously seen  Choroid Plexus:        Previously seen        Diaphragm:              Previously seen  Cerebellum:             Previously seen        Stomach:                Appears normal, left                                                                        sided  Posterior Fossa:       Previously seen        Abdomen:                Appears normal  Nuchal Fold:           Previously seen        Abdominal Wall:         Previously seen  Face:                  Orbits and profile     Cord Vessels:  Appears normal (3                         previously seen                                vessel cord)  Lips:                  Previously seen        Kidneys:                Appear normal  Palate:                Previously seen        Bladder:                Appears normal  Thoracic:              Previously seen        Spine:                  Previously seen  Heart:                 Appears normal         Upper Extremities:      Previously seen                         (4CH, axis, and                         situs)  RVOT:                  Previously seen        Lower Extremities:      Previously seen  Other:  Heels/feet and open hands/5th digits previously visualized. Nasal          bone, lenses, maxilla, mandible and falx previously visualized. VC,          3VV and 3VTV previously visualized. ---------------------------------------------------------------------- Cervix Uterus Adnexa  Cervix  Not visualized (advanced GA >24wks)  Uterus  No abnormality visualized.  Right Ovary  Not visualized.  Left Ovary  Not visualized.  Cul De Sac  No free fluid seen.  Adnexa  No abnormality visualized ---------------------------------------------------------------------- Impression  History of bariatric surgery.  Patient had vitamin B12  deficiency early in pregnancy that was treated.  History of vaginal bleeding that has resolved.  Amniotic fluid is normal good fetal activity seen.  Fetal growth  is appropriate for gestational age. ---------------------------------------------------------------------- Recommendations  -No follow-up appointments were  made . ----------------------------------------------------------------------                 Noralee Space, MD Electronically Signed Final Report   10/03/2022 09:12 am ----------------------------------------------------------------------   Assessment and Plan:  Pregnancy: [id number] at [redacted]w[redacted]d 1. Decreased fetal movements in third trimester, single or unspecified fetus IOL scheduled at 39 weeks (10/25/22) at midnight, patient will go in tonight at 11:45 pm. Favorable cervix.  Orders placed.  2. GBS (group B Streptococcus carrier), +RV culture, currently pregnant Will need PCN prophylaxis  3. [redacted] weeks gestation of pregnancy 4. Supervision of other normal pregnancy, antepartum Term labor symptoms and general obstetric precautions including but not limited to vaginal bleeding, contractions, leaking of fluid and fetal movement were reviewed in detail with the patient. Please refer to After Visit  Summary for other counseling recommendations.   Return for Postpartum visit.  Future Appointments  Date Time Provider Department Center  10/25/2022 12:00 AM [facility] SCHED ROOM [facility] None    Jaynie Collins, MD

## 2022-10-24 NOTE — H&P (Signed)
OBSTETRIC ADMISSION HISTORY AND PHYSICAL  Miranda Curtis is a 22 y.o. female [phone] with IUP at [redacted]w[redacted]d by LMP presenting for IOL. She reports +FMs. She plans on EBM feeding. She is undecided about birth control. She received her prenatal care at  [facility]    Dating: By LMP --->  Estimated Date of Delivery: 11/01/22  Sono:    @[redacted]w[redacted]d , CWD, normal anatomy, cephalic presentation, anterior placenta, 2652g, 36% EFW   Prenatal History/Complications:  -GBS+ -Hx of bariatric sx -Varicella NI  Past Medical History: 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-has resolved following surgery   Eczema    History of depression 11/18/2017   History of diabetes mellitus 04/11/2022   Missed abortion 04/07/2019    Past Surgical History: Past Surgical History:  Procedure Laterality Date   SLEEVE GASTROPLASTY  10/12/2021   UPPER GASTROINTESTINAL ENDOSCOPY N/A     Obstetrical History: OB History     Gravida  4   Para  1   Term  1   Preterm      AB  2   Living  1      SAB  2   IAB      Ectopic      Multiple  0   Live Births  1           Social History Social History   Socioeconomic History   Marital status: Single    Spouse name: Not on file   Number of children: 1   Years of education: 13   Highest education level: Not on file  Occupational History   Occupation: Consulting civil engineer   Occupation: CNA  Tobacco Use   Smoking status: Never   Smokeless tobacco: Never  Vaping Use   Vaping status: Never Used  Substance and Sexual Activity   Alcohol use: No   Drug use: No   Sexual activity: Yes    Partners: Male    Birth control/protection: None    Comment: last SI Friday AM 09/14/2022  Other Topics Concern   Not on file  Social History Narrative   Not on file   Social Determinants of  Health   Financial Resource Strain: Low Risk  (03/19/2022)   Overall Financial Resource Strain (CARDIA)    Difficulty of Paying Living Expenses: Not hard at all  Food Insecurity: No Food Insecurity (03/19/2022)   Hunger Vital Sign    Worried About Running Out of Food in the Last Year: Never true    Ran Out of Food in the Last Year: Never true  Transportation Needs: No Transportation Needs (03/19/2022)   PRAPARE - Administrator, Civil Service (Medical): No    Lack of Transportation (Non-Medical): No  Physical Activity: Inactive (03/19/2022)   Exercise Vital Sign    Days of Exercise per Week: 0 days    Minutes of Exercise per Session: 0 min  Stress: No Stress Concern Present (03/19/2022)   [employer] of Occupational Health - Occupational Stress Questionnaire    Feeling of Stress : Not at all  Social Connections: Unknown (03/19/2022)   Social Connection and Isolation Panel [NHANES]    Frequency of Communication with Friends and Family: More than three times a week    Frequency of Social Gatherings with Friends and Family: More than three  times a week    Attends Religious Services: Never    Active Member of Clubs or Organizations: No    Attends Engineer, structural: Never    Marital Status: Not on file    Family History: Family History  Problem Relation Age of Onset   Diabetes Mother    Healthy Mother    Sickle cell trait Mother    Sickle cell trait Father    Rashes / Skin problems Father        eczema   Asthma Sister    Rashes / Skin problems Sister        eczema   Rashes / Skin problems Sister        eczema   Rashes / Skin problems Sister        eczema   Sickle cell anemia Sister    Rashes / Skin problems Sister        eczema   Autism Brother    Healthy Brother    Diabetes Maternal Grandmother    Hypertension Maternal Grandmother    Cancer Maternal Grandfather        prostate   Cancer Paternal Grandmother 23       lung   Healthy  Paternal Grandfather    Ovarian cancer Neg Hx    Colon cancer Neg Hx    Breast cancer Neg Hx     Allergies: Allergies  Allergen Reactions   Metoclopramide Other (See Comments)    "Severe panic attack" "Severe panic attack" "Severe panic attack"    No medications prior to admission.     Review of Systems   All systems reviewed and negative except as stated in HPI  Last menstrual period 01/25/2022, unknown if currently breastfeeding. General appearance: alert and no distress Lungs: normal effort, increased with contractions  Heart: regular rate noted Abdomen: gravid Extremities: No LE edema Presentation: cephalic Fetal monitoringBaseline: 130 bpm, Variability: Good {> 6 bpm), Accelerations: Reactive, and Decelerations: Absent Uterine activity every 3 mins     Prenatal labs: ABO, Rh: AB/Positive/-- (12/27 1117) Antibody: Negative (12/27 1117) Rubella: 1.21 (12/27 1117) RPR: Non Reactive (05/10 0932)  HBsAg: Negative (12/27 1117)  HIV: Non Reactive (05/10 0932)  GBS: Positive/-- (06/27 1000)  1 hr Glucola n/a, A1c <4.2 Genetic screening  neg Anatomy US wnl  Prenatal Transfer Tool  Maternal Diabetes: No Genetic Screening: Normal Maternal Ultrasounds/Referrals: Normal Fetal Ultrasounds or other Referrals:  None Maternal Substance Abuse:  No Significant Maternal Medications:  None Significant Maternal Lab Results:  Group B Strep positive Number of Prenatal Visits:greater than 3 verified prenatal visits Other Comments:  None  No results found for this or any previous visit (from the past 24 hour(s)).  Patient Active Problem List   Diagnosis Date Noted   GBS (group B Streptococcus carrier), +RV culture, currently pregnant 10/06/2022   Vitamin D deficiency 05/09/2022   Obesity (BMI 30.0-34.9) 05/09/2022   Obesity in pregnancy 04/11/2022   History of bariatric surgery 04/11/2022   Supervision of other normal pregnancy, antepartum 03/19/2022   Vitamin B 12  deficiency 10/26/2017   Maternal varicella, non-immune 05/09/2017    Assessment/Plan:  Miranda Curtis is a 22 y.o. G4P1021 at [redacted]w[redacted]d here for IOL 2/2 chronic DFM. Reports +FM now.   #Labor: Contracting. Plan to AROM after comfortable with epidural.  #Pain: Planning to get epidural #FWB: Cat I #ID: GBS pos, PCN ppx #MOF: EBM #MOC: Unsure #Circ:  yes  Miranda Bolte Autry-Lott, DO  10/25/2022, 12:00 AM

## 2022-10-25 ENCOUNTER — Inpatient Hospital Stay (HOSPITAL_COMMUNITY): Payer: MEDICAID

## 2022-10-25 ENCOUNTER — Encounter (HOSPITAL_COMMUNITY): Payer: Self-pay | Admitting: Obstetrics & Gynecology

## 2022-10-25 ENCOUNTER — Inpatient Hospital Stay (HOSPITAL_COMMUNITY)
Admission: RE | Admit: 2022-10-25 | Discharge: 2022-10-26 | DRG: 807 | Disposition: A | Discharge status: Home / Self Care | Payer: MEDICAID | Attending: Obstetrics & Gynecology | Admitting: Obstetrics & Gynecology

## 2022-10-25 ENCOUNTER — Other Ambulatory Visit: Payer: Self-pay

## 2022-10-25 ENCOUNTER — Inpatient Hospital Stay (HOSPITAL_COMMUNITY): Payer: MEDICAID | Admitting: Anesthesiology

## 2022-10-25 DIAGNOSIS — Z3A39 39 weeks gestation of pregnancy: Secondary | ICD-10-CM

## 2022-10-25 DIAGNOSIS — O99214 Obesity complicating childbirth: Secondary | ICD-10-CM | POA: Diagnosis present

## 2022-10-25 DIAGNOSIS — O99820 Streptococcus B carrier state complicating pregnancy: Secondary | ICD-10-CM | POA: Diagnosis not present

## 2022-10-25 DIAGNOSIS — O368130 Decreased fetal movements, third trimester, not applicable or unspecified: Principal | ICD-10-CM | POA: Diagnosis present

## 2022-10-25 DIAGNOSIS — O99844 Bariatric surgery status complicating childbirth: Secondary | ICD-10-CM | POA: Diagnosis present

## 2022-10-25 DIAGNOSIS — O9902 Anemia complicating childbirth: Secondary | ICD-10-CM | POA: Diagnosis present

## 2022-10-25 DIAGNOSIS — O99824 Streptococcus B carrier state complicating childbirth: Secondary | ICD-10-CM | POA: Diagnosis present

## 2022-10-25 LAB — RPR: RPR Ser Ql: NONREACTIVE

## 2022-10-25 LAB — CBC
HCT: 31.7 % — ABNORMAL LOW (ref 36.0–46.0)
Hemoglobin: 10 g/dL — ABNORMAL LOW (ref 12.0–15.0)
MCH: 22 pg — ABNORMAL LOW (ref 26.0–34.0)
MCHC: 31.5 g/dL (ref 30.0–36.0)
MCV: 69.7 fL — ABNORMAL LOW (ref 80.0–100.0)
Platelets: 264 10*3/uL (ref 150–400)
RBC: 4.55 MIL/uL (ref 3.87–5.11)
RDW: 16.2 % — ABNORMAL HIGH (ref 11.5–15.5)
WBC: 10.4 10*3/uL (ref 4.0–10.5)
nRBC: 0 % (ref 0.0–0.2)

## 2022-10-25 LAB — TYPE AND SCREEN
ABO/RH(D): AB POS
Antibody Screen: NEGATIVE

## 2022-10-25 MED ORDER — OXYCODONE-ACETAMINOPHEN 5-325 MG PO TABS: 2.0000 | ORAL_TABLET | ORAL | Status: DC | PRN

## 2022-10-25 MED ORDER — EPHEDRINE 5 MG/ML INJ: 10.0000 mg | INTRAVENOUS | Status: DC | PRN

## 2022-10-25 MED ORDER — ONDANSETRON HCL 4 MG PO TABS: 4.0000 mg | ORAL_TABLET | ORAL | Status: DC | PRN

## 2022-10-25 MED ORDER — FERROUS SULFATE 325 (65 FE) MG PO TABS
325.0000 mg | ORAL_TABLET | Freq: Every day | ORAL | Status: DC
  Filled 2022-10-25: qty 1

## 2022-10-25 MED ORDER — SOD CITRATE-CITRIC ACID 500-334 MG/5ML PO SOLN: 30.0000 mL | ORAL | Status: DC | PRN

## 2022-10-25 MED ORDER — SODIUM CHLORIDE 0.9 % IV SOLN
5.0000 10*6.[IU] | Freq: Once | INTRAVENOUS | Status: AC
  Administered 2022-10-25: 5 10*6.[IU] via INTRAVENOUS
  Filled 2022-10-25: qty 5

## 2022-10-25 MED ORDER — DIPHENHYDRAMINE HCL 25 MG PO CAPS: 25.0000 mg | ORAL_CAPSULE | Freq: Four times a day (QID) | ORAL | Status: DC | PRN

## 2022-10-25 MED ORDER — DIBUCAINE (PERIANAL) 1 % EX OINT: 1.0000 | TOPICAL_OINTMENT | CUTANEOUS | Status: DC | PRN

## 2022-10-25 MED ORDER — OXYTOCIN-SODIUM CHLORIDE 30-0.9 UT/500ML-% IV SOLN
1.0000 m[IU]/min | INTRAVENOUS | Status: DC
  Administered 2022-10-25: 2 m[IU]/min via INTRAVENOUS
  Filled 2022-10-25: qty 500

## 2022-10-25 MED ORDER — TETANUS-DIPHTH-ACELL PERTUSSIS 5-2.5-18.5 LF-MCG/0.5 IM SUSY: 0.5000 mL | PREFILLED_SYRINGE | Freq: Once | INTRAMUSCULAR | Status: DC

## 2022-10-25 MED ORDER — OXYCODONE-ACETAMINOPHEN 5-325 MG PO TABS: 1.0000 | ORAL_TABLET | ORAL | Status: DC | PRN

## 2022-10-25 MED ORDER — ZOLPIDEM TARTRATE 5 MG PO TABS: 5.0000 mg | ORAL_TABLET | Freq: Every evening | ORAL | Status: DC | PRN

## 2022-10-25 MED ORDER — ACETAMINOPHEN 325 MG PO TABS: 650.0000 mg | ORAL_TABLET | ORAL | Status: DC | PRN

## 2022-10-25 MED ORDER — SIMETHICONE 80 MG PO CHEW: 80.0000 mg | CHEWABLE_TABLET | ORAL | Status: DC | PRN

## 2022-10-25 MED ORDER — FENTANYL-BUPIVACAINE-NACL 0.5-0.125-0.9 MG/250ML-% EP SOLN
EPIDURAL | Status: DC | PRN
  Administered 2022-10-25: 12 mL/h via EPIDURAL

## 2022-10-25 MED ORDER — OXYTOCIN BOLUS FROM INFUSION
333.0000 mL | Freq: Once | INTRAVENOUS | Status: AC
  Administered 2022-10-25: 333 mL via INTRAVENOUS

## 2022-10-25 MED ORDER — FENTANYL-BUPIVACAINE-NACL 0.5-0.125-0.9 MG/250ML-% EP SOLN
12.0000 mL/h | EPIDURAL | Status: DC | PRN
  Filled 2022-10-25: qty 250

## 2022-10-25 MED ORDER — PHENYLEPHRINE 80 MCG/ML (10ML) SYRINGE FOR IV PUSH (FOR BLOOD PRESSURE SUPPORT): 80.0000 ug | PREFILLED_SYRINGE | INTRAVENOUS | Status: DC | PRN

## 2022-10-25 MED ORDER — BENZOCAINE-MENTHOL 20-0.5 % EX AERO: 1.0000 | INHALATION_SPRAY | CUTANEOUS | Status: DC | PRN

## 2022-10-25 MED ORDER — ONDANSETRON HCL 4 MG/2ML IJ SOLN
4.0000 mg | Freq: Four times a day (QID) | INTRAMUSCULAR | Status: DC | PRN
  Administered 2022-10-25: 4 mg via INTRAVENOUS
  Filled 2022-10-25: qty 2

## 2022-10-25 MED ORDER — FLEET ENEMA 7-19 GM/118ML RE ENEM: 1.0000 | ENEMA | Freq: Every day | RECTAL | Status: DC | PRN

## 2022-10-25 MED ORDER — LACTATED RINGERS IV SOLN: INTRAVENOUS | Status: DC

## 2022-10-25 MED ORDER — PENICILLIN G POT IN DEXTROSE 60000 UNIT/ML IV SOLN
3.0000 10*6.[IU] | INTRAVENOUS | Status: DC
  Administered 2022-10-25 (×2): 3 10*6.[IU] via INTRAVENOUS
  Filled 2022-10-25 (×2): qty 50

## 2022-10-25 MED ORDER — FENTANYL CITRATE (PF) 100 MCG/2ML IJ SOLN: 50.0000 ug | INTRAMUSCULAR | Status: DC | PRN

## 2022-10-25 MED ORDER — SODIUM CHLORIDE 0.9 % IV SOLN: 250.0000 mL | INTRAVENOUS | Status: DC | PRN

## 2022-10-25 MED ORDER — SODIUM CHLORIDE 0.9% FLUSH: 3.0000 mL | Freq: Two times a day (BID) | INTRAVENOUS | Status: DC

## 2022-10-25 MED ORDER — WITCH HAZEL-GLYCERIN EX PADS: 1.0000 | MEDICATED_PAD | CUTANEOUS | Status: DC | PRN

## 2022-10-25 MED ORDER — IBUPROFEN 600 MG PO TABS
600.0000 mg | ORAL_TABLET | Freq: Four times a day (QID) | ORAL | Status: DC
  Administered 2022-10-25 – 2022-10-26 (×4): 600 mg via ORAL
  Filled 2022-10-25 (×4): qty 1

## 2022-10-25 MED ORDER — HYDROXYZINE HCL 50 MG PO TABS: 50.0000 mg | ORAL_TABLET | Freq: Four times a day (QID) | ORAL | Status: DC | PRN

## 2022-10-25 MED ORDER — LACTATED RINGERS IV SOLN: 500.0000 mL | INTRAVENOUS | Status: DC | PRN

## 2022-10-25 MED ORDER — PRENATAL MULTIVITAMIN CH
1.0000 | ORAL_TABLET | Freq: Every day | ORAL | Status: DC
  Administered 2022-10-26: 1 via ORAL
  Filled 2022-10-25: qty 1

## 2022-10-25 MED ORDER — COCONUT OIL OIL: 1.0000 | TOPICAL_OIL | Status: DC | PRN

## 2022-10-25 MED ORDER — OXYTOCIN-SODIUM CHLORIDE 30-0.9 UT/500ML-% IV SOLN
2.5000 [IU]/h | INTRAVENOUS | Status: DC
  Administered 2022-10-25: 2.5 [IU]/h via INTRAVENOUS

## 2022-10-25 MED ORDER — DIPHENHYDRAMINE HCL 50 MG/ML IJ SOLN
12.5000 mg | INTRAMUSCULAR | Status: DC | PRN
  Administered 2022-10-25 (×2): 12.5 mg via INTRAVENOUS
  Filled 2022-10-25: qty 1

## 2022-10-25 MED ORDER — SODIUM CHLORIDE 0.9% FLUSH: 3.0000 mL | INTRAVENOUS | Status: DC | PRN

## 2022-10-25 MED ORDER — LIDOCAINE HCL (PF) 1 % IJ SOLN: 30.0000 mL | INTRAMUSCULAR | Status: DC | PRN

## 2022-10-25 MED ORDER — LACTATED RINGERS IV SOLN
500.0000 mL | Freq: Once | INTRAVENOUS | Status: AC
  Administered 2022-10-25: 500 mL via INTRAVENOUS

## 2022-10-25 MED ORDER — ONDANSETRON HCL 4 MG/2ML IJ SOLN: 4.0000 mg | INTRAMUSCULAR | Status: DC | PRN

## 2022-10-25 MED ORDER — SENNOSIDES-DOCUSATE SODIUM 8.6-50 MG PO TABS
2.0000 | ORAL_TABLET | ORAL | Status: DC
  Administered 2022-10-26: 2 via ORAL
  Filled 2022-10-25: qty 2

## 2022-10-25 MED ORDER — PHENYLEPHRINE 80 MCG/ML (10ML) SYRINGE FOR IV PUSH (FOR BLOOD PRESSURE SUPPORT)
80.0000 ug | PREFILLED_SYRINGE | INTRAVENOUS | Status: DC | PRN
  Filled 2022-10-25: qty 10

## 2022-10-25 MED ORDER — TERBUTALINE SULFATE 1 MG/ML IJ SOLN: 0.2500 mg | Freq: Once | INTRAMUSCULAR | Status: DC | PRN

## 2022-10-25 MED ORDER — LIDOCAINE HCL (PF) 1 % IJ SOLN
INTRAMUSCULAR | Status: DC | PRN
  Administered 2022-10-25: 10 mL via EPIDURAL
  Administered 2022-10-25: 2 mL via EPIDURAL

## 2022-10-25 NOTE — Anesthesia Preprocedure Evaluation (Signed)
Anesthesia Evaluation  Patient identified by MRN, date of birth, ID band Patient awake    Reviewed: Allergy & Precautions, Patient's Chart, lab work & pertinent test results  Airway Mallampati: II  TM Distance: >3 FB Neck ROM: Full    Dental no notable dental hx.    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   Endo/Other  Obesity BMI 37  Renal/GU negative Renal ROS  negative genitourinary   Musculoskeletal negative musculoskeletal ROS (+)    Abdominal  (+) + obese  Peds negative pediatric ROS (+)  Hematology  (+) Blood dyscrasia, anemia Hb 10, plt 264   Anesthesia Other Findings   Reproductive/Obstetrics (+) Pregnancy                             Anesthesia Physical Anesthesia Plan  ASA: 2  Anesthesia Plan: Epidural   Post-op Pain Management:    Induction:   PONV Risk Score and Plan: 2  Airway Management Planned: Natural Airway  Additional Equipment: None  Intra-op Plan:   Post-operative Plan:   Informed Consent: I have reviewed the patients History and Physical, chart, labs and discussed the procedure including the risks, benefits and alternatives for the proposed anesthesia with the patient or authorized representative who has indicated his/her understanding and acceptance.       Plan Discussed with:   Anesthesia Plan Comments:        Anesthesia Quick Evaluation

## 2022-10-25 NOTE — Discharge Summary (Signed)
Postpartum Discharge Summary  Patient Name: Miranda Curtis DOB: 2000/10/27 MRN: [id number]  Date of admission: 10/25/2022 Delivery date:10/25/2022 Delivering provider: Carley Hammed MARIE Date of discharge: 10/26/2022  Admitting diagnosis: Decreased fetal movements in third trimester, single or unspecified fetus [O36.8130] Intrauterine pregnancy: [redacted]w[redacted]d     Secondary diagnosis:  Principal Problem:   Vaginal delivery Active Problems:   Decreased fetal movements in third trimester, single or unspecified fetus  Additional problems: Anemia    Discharge diagnosis: Term Pregnancy Delivered                                              Post partum procedures: N/A Augmentation:  None Complications: None  Hospital course: Onset of Labor With Vaginal Delivery      22 y.o. yo [id number] at [redacted]w[redacted]d was admitted in Active Labor on 10/25/2022. Labor course was complicated by nothing.  Membrane Rupture Time/Date: 5:18 AM,10/25/2022  Delivery Method:Vaginal, Spontaneous Episiotomy: None Lacerations:  None Patient had a postpartum course without complications  She is ambulating, tolerating a regular diet, passing flatus, and urinating well. Denies any fevers, chills, lightheadedness, headache, vision changes, chest pain, or shortness of breath.  Patient is discharged home in stable condition on 10/26/22.  Newborn Data: Birth date:10/25/2022 Birth time:8:56 AM Gender:Female Living status:Living Apgars:9 ,9  Weight:3010 g  Magnesium Sulfate received: No BMZ received: No Rhophylac:N/A MMR:N/A T-DaP: declined Flu: Declined Transfusion:No  Physical exam  Vitals:   10/25/22 1200 10/25/22 1615 10/25/22 2008 10/25/22 2341  BP: 106/72 110/72 104/75 101/70  Pulse: 80 80 80 81  Resp: 18 18 16 18   Temp: 98 F (36.7 C) 98 F (36.7 C) 98.3 F (36.8 C)   TempSrc: Oral Oral Oral   SpO2:      Weight:      Height:       General: alert, cooperative, and no distress Lochia: appropriate Uterine  Fundus: firm Incision: N/A DVT Evaluation: No evidence of DVT seen on physical exam. Labs: Lab Results  Component Value Date   WBC 12.3 (H) 10/26/2022   HGB 8.2 (L) 10/26/2022   HCT 26.5 (L) 10/26/2022   MCV 71.2 (L) 10/26/2022   PLT 221 10/26/2022      Latest Ref Rng & Units 06/11/2022   12:11 PM  CMP  Glucose 70 - 99 mg/dL 75   BUN 6 - 20 mg/dL 5   Creatinine 1.47 - 8.29 mg/dL 5.62   Sodium 130 - 865 mmol/L 135   Potassium 3.5 - 5.1 mmol/L 3.9   Chloride 98 - 111 mmol/L 104   CO2 22 - 32 mmol/L 18   Calcium 8.9 - 10.3 mg/dL 9.2   Total Protein 6.5 - 8.1 g/dL 6.4   Total Bilirubin 0.3 - 1.2 mg/dL 0.6   Alkaline Phos 38 - 126 U/L 34   AST 15 - 41 U/L 14   ALT 0 - 44 U/L 10    Edinburgh Score:    10/25/2022   11:00 AM  Edinburgh Postnatal Depression Scale Screening Tool  I have been able to laugh and see the funny side of things. 0  I have looked forward with enjoyment to things. 0  I have blamed myself unnecessarily when things went wrong. 1  I have been anxious or worried for no good reason. 1  I have felt scared or panicky for no good reason.  0  Things have been getting on top of me. 0  I have been so unhappy that I have had difficulty sleeping. 0  I have felt sad or miserable. 0  I have been so unhappy that I have been crying. 0  The thought of harming myself has occurred to me. 0  Edinburgh Postnatal Depression Scale Total 2     After visit meds:  Allergies as of 10/26/2022       Reactions   Reglan [metoclopramide] Anxiety, Other (See Comments)   Severe panic attack        Medication List     STOP taking these medications    aspirin EC 81 MG tablet   PRENATAL VITAMIN PO Replaced by: multivitamin prental 60-1 MG Tabs tablet       TAKE these medications    acetaminophen 325 MG tablet Commonly known as: Tylenol Take 2 tablets (650 mg total) by mouth every 4 (four) hours as needed for up to 30 doses for moderate pain.   ferrous sulfate 325 (65  FE) MG tablet Take 1 tablet (325 mg total) by mouth every other day.   ibuprofen 600 MG tablet Commonly known as: ADVIL Take 1 tablet (600 mg total) by mouth every 6 (six) hours.   multivitamin prental 60-1 MG Tabs tablet Take 1 tablet by mouth daily. Replaces: PRENATAL VITAMIN PO         Discharge home in stable condition Infant Feeding:  Infant Disposition:home with mother Discharge instruction: per After Visit Summary and Postpartum booklet. Activity: Advance as tolerated. Pelvic rest for 6 weeks.  Diet: routine diet Future Appointments: Future Appointments  Date Time Provider Department Center  11/22/2022 10:15 AM Sunizona Bing, MD [facility]   Follow up Visit:  Follow-up Information     [facility] Follow up.   Specialty: Obstetrics and Gynecology Why: As needed Contact information: [address] [place] [address] [phone]        Cromwell Bing, MD .   Specialty: Obstetrics and Gynecology Contact information: [address] [place] [address] [phone]                Please schedule this patient for a In person postpartum visit in 4 weeks with the following provider: Any provider. Additional Postpartum F/U: None   Low risk pregnancy complicated by:  nothing Delivery mode:  Vaginal, Spontaneous Anticipated Birth Control:  Unsure   10/26/2022 Glee Arvin, MD

## 2022-10-25 NOTE — Progress Notes (Addendum)
Labor Progress Note Miranda Curtis is a 22 y.o. [id number] at [redacted]w[redacted]d presented for IOL.   S: No acute concerns.   O:  BP 112/68   Pulse 90   Temp 97.6 F (36.4 C) (Oral)   Resp 16   Ht 5\' 1"  (1.549 m)   Wt 87.5 kg   LMP 01/25/2022 (Exact Date)   SpO2 98%   BMI 36.47 kg/m  EFM: 115BPM/moderate/+accels, no decels  CVE: Dilation: 4.5 Effacement (%): 80 Station: -2 Presentation: Vertex Exam by:: Dr. Salvadore Dom   A&P: 22 y.o. [id number] [redacted]w[redacted]d here for IOL. #Labor: Unchanged from her last exam. S/p AROM, light meconium stained. Start pitocin.  #Pain: Epidural  #FWB: Cat I  #GBS positive  Miranda Boggus Autry-Lott, DO 6:38 AM

## 2022-10-25 NOTE — Anesthesia Procedure Notes (Signed)
Epidural Patient location during procedure: OB Start time: 10/25/2022 12:58 AM End time: 10/25/2022 1:10 AM  Staffing Anesthesiologist: Lannie Fields, DO Performed: anesthesiologist   Preanesthetic Checklist Completed: patient identified, IV checked, risks and benefits discussed, monitors and equipment checked, pre-op evaluation and timeout performed  Epidural Patient position: sitting Prep: DuraPrep and site prepped and draped Patient monitoring: continuous pulse ox, blood pressure, heart rate and cardiac monitor Approach: midline Location: L3-L4 Injection technique: LOR air  Needle:  Needle type: Tuohy  Needle gauge: 17 G Needle length: 9 cm Needle insertion depth: 9 cm Catheter type: closed end flexible Catheter size: 19 Gauge Catheter at skin depth: 14 cm Test dose: negative  Assessment Sensory level: T8 Events: blood not aspirated, no cerebrospinal fluid, injection not painful, no injection resistance, no paresthesia and negative IV test  Additional Notes Patient identified. Risks/Benefits/Options discussed with patient including but not limited to bleeding, infection, nerve damage, paralysis, failed block, incomplete pain control, headache, blood pressure changes, nausea, vomiting, reactions to medication both or allergic, itching and postpartum back pain. Confirmed with bedside nurse the patient's most recent platelet count. Confirmed with patient that they are not currently taking any anticoagulation, have any bleeding history or any family history of bleeding disorders. Patient expressed understanding and wished to proceed. All questions were answered. Sterile technique was used throughout the entire procedure. Please see nursing notes for vital signs. Test dose was given through epidural catheter and negative prior to continuing to dose epidural or start infusion. Warning signs of high block given to the patient including shortness of breath, tingling/numbness in  hands, complete motor block, or any concerning symptoms with instructions to call for help. Patient was given instructions on fall risk and not to get out of bed. All questions and concerns addressed with instructions to call with any issues or inadequate analgesia.  Reason for block:procedure for pain

## 2022-10-26 LAB — CBC
HCT: 26.5 % — ABNORMAL LOW (ref 36.0–46.0)
Hemoglobin: 8.2 g/dL — ABNORMAL LOW (ref 12.0–15.0)
MCH: 22 pg — ABNORMAL LOW (ref 26.0–34.0)
MCHC: 30.9 g/dL (ref 30.0–36.0)
MCV: 71.2 fL — ABNORMAL LOW (ref 80.0–100.0)
Platelets: 221 10*3/uL (ref 150–400)
RBC: 3.72 MIL/uL — ABNORMAL LOW (ref 3.87–5.11)
RDW: 16.4 % — ABNORMAL HIGH (ref 11.5–15.5)
WBC: 12.3 10*3/uL — ABNORMAL HIGH (ref 4.0–10.5)
nRBC: 0 % (ref 0.0–0.2)

## 2022-10-26 LAB — BIRTH TISSUE RECOVERY COLLECTION (PLACENTA DONATION)

## 2022-10-26 MED ORDER — IBUPROFEN 600 MG PO TABS: 600.0000 mg | ORAL_TABLET | Freq: Four times a day (QID) | ORAL | 0 refills | Status: DC

## 2022-10-26 MED ORDER — TRINATAL RX 1 60-1 MG PO TABS: 1.0000 | ORAL_TABLET | Freq: Every day | ORAL | 3 refills | Status: DC

## 2022-10-26 MED ORDER — FERROUS SULFATE 325 (65 FE) MG PO TABS: 325.0000 mg | ORAL_TABLET | ORAL | 1 refills | Status: DC

## 2022-10-26 MED ORDER — FERROUS SULFATE 325 (65 FE) MG PO TABS
325.0000 mg | ORAL_TABLET | ORAL | Status: DC
  Administered 2022-10-26: 325 mg via ORAL

## 2022-10-26 NOTE — Progress Notes (Signed)
Attending Circumcision Counseling Progress Note  Patient desires circumcision for her female infant.  Circumcision procedure details discussed, risks and benefits of procedure were also discussed.  These include but are not limited to: Benefits of circumcision in men include reduction in the rates of urinary tract infection (UTI), penile cancer, some sexually transmitted infections, penile inflammatory and retractile disorders, as well as easier hygiene.  Risks include bleeding , infection, injury of glans which may lead to penile deformity or urinary tract issues, unsatisfactory cosmetic appearance and other potential complications related to the procedure.  It was emphasized that this is an elective procedure.  Patient wants to proceed with circumcision; written informed consent obtained.  Will do circumcision soon, routine circumcision and post circumcision care ordered for the infant.  Warden Fillers, MD 10/26/2022 10:40 AM

## 2022-10-26 NOTE — Progress Notes (Addendum)
POSTPARTUM PROGRESS NOTE  Subjective: Miranda Curtis is a 22 y.o. [id number] s/p SVD at [redacted]w[redacted]d.  She reports she is doing well. No acute events overnight. She denies any problems with ambulating, voiding or po intake. Denies nausea or vomiting. She has passed flatus. Pain is moderately controlled.  Lochia is moderate. Denies any fevers, chills, lightheadedness, headache, vision changes, chest pain, or shortness of breath.   Objective: Blood pressure 101/70, pulse 81, temperature 98.3 F (36.8 C), temperature source Oral, resp. rate 18, height 5\' 1"  (1.549 m), weight 87.5 kg, last menstrual period 01/25/2022, SpO2 98%, unknown if currently breastfeeding.  Physical Exam:  General: alert, cooperative and no distress Respiratory: Clear to auscultation bilaterally, no wheezes, rales, or rhonchi. Cardio: Regular rate and rhythm, no murmurs rubs, or gallops.  Abdomen: soft, non-tender  Uterine Fundus: firm and at level of umbilicus Extremities: No calf swelling or tenderness  no edema  Recent Labs    10/25/22 0019 10/26/22 0601  HGB 10.0* 8.2*  HCT 31.7* 26.5*    Assessment/Plan: Miranda Curtis is a 22 y.o. [id number] s/p SVD at [redacted]w[redacted]d  Routine Postpartum Care: Doing well, pain well-controlled.  -- Continue routine care, lactation support  -- Contraception: Wants to decide at postpartum visit -- Feeding: EBM -- Anemia: Oral iron started  Dispo: Plan for discharge today (10/26/2022) as long as baby is being discharged.  Glee Arvin, MD Faculty Practice, Center for [facility] 10/26/2022 8:00 AM   Attestation of Attending Supervision of Resident: Evaluation and management procedures were performed by the [facility] Medicine Resident under my supervision. I was immediately available for direct supervision, assistance and direction throughout this encounter.  I also confirm that I have verified the information documented in the resident's note, and that I have also personally reperformed  the pertinent components of the physical exam and all of the medical decision making activities.  I have also made any necessary editorial changes.  Warden Fillers, MD Attending Obstetrician & Gynecologist, [facility] for [facility], [facility] 10/26/2022 10:51 AM

## 2022-10-26 NOTE — Clinical Social Work Maternal (Signed)
CLINICAL SOCIAL WORK MATERNAL/CHILD NOTE  Patient Details  Name: Miranda Curtis MRN: [id number] Date of Birth: 2001-02-21  Date:  10/26/2022  Clinical Social Worker Initiating Note:  Enos Fling Date/Time: Initiated:  10/26/22/1236     Child's Name:  Miranda Curtis   Biological Parents:  Mother, Father Miranda Curtis (2000[birth year]01-19) Miranda Curtis  (06-20-1998))   Need for Interpreter:  None   Reason for Referral:  Behavioral Health Concerns   Address:  [address] [place] [address]    Phone number:  [phone] (home)     Additional phone number:   Household Members/Support Persons (HM/SP):   Household Member/Support Person 1   HM/SP Name Relationship DOB or Age  HM/SP -1 Miranda Curtis FOB 06[birth year]1998  HM/SP -2        HM/SP -3        HM/SP -4        HM/SP -5        HM/SP -6        HM/SP -7        HM/SP -8          Natural Supports (not living in the home):  Extended Family, Immediate Family, Friends, Spouse/significant other, Children   Professional Supports: None   Employment: Environmental education officer   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   Education:  Some Materials engineer arranged:    Surveyor, quantity Resources:  Media planner    Other Resources:  Sales executive  , WIC   Cultural/Religious Considerations Which May Impact Care:    Strengths:  Ability to meet basic needs  , Merchandiser, retail, Home prepared for child  , Understanding of illness   Psychotropic Medications:         Pediatrician:    Armed forces operational officer area  Pediatrician List:   [facility]  [place]    [place]    [place] [facility]      Pediatrician Fax Number:    Risk Factors/Current Problems:  Mental Health Concerns     Cognitive State:  Able to Concentrate  , Alert  , Goal Oriented  , Insightful  , Linear Thinking     Mood/Affect:  Interested  , Comfortable  , Relaxed  , Calm     CSW Assessment: CSW received a consult for anxiety,  depression and ADHD. CSW met [name] at bedside to complete a full psychosocial assessment. CSW entered the room, introduced herself and acknowledged that guest were present. [name] gave CSW verbal permission to speak about anything while guest were present. CSW explained her role and the reason for the visit. [name] was polite, easy to engage, receptive to meeting with CSW, and appeared forthcoming.  CSW collected [name]'s demographic information and inquired about her mental health history. [name] reported being diagnosed with Anxiety, Depression and Bipolar during her 2nd hospital stay in 2017. [name] reported experiencing mental health since childhood due to family struggles. [name] reported experiencing several different hospitalization since 2013; and location that included [facility], [facility] and [place]. 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 [name] reported being prescribed medication in the past; however she denied current use, due to not liking how the medication makes her feel and overall does not want to take medication. [name] reported participating in therapy in the past for support; however denied participating currently. [name] reported coping skills that included maturing over time and knowing how to balance her  mood on her own. 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 [name] reported reaching out to her therapist for support. [name] reported currently feeling "good" since delivery and bonded well with infant. CSW provided education regarding the baby blues period vs. perinatal mood disorders, discussed treatment and gave resources for mental health follow up if concerns arise.  CSW recommends self-evaluation during the postpartum time period using the New Mom Checklist from Postpartum Progress and encouraged [name] to contact a medical professional if symptoms are noted at any time. CSW assessed for safety with [name] SI and HI; [name] denied all. CSW did not  assess for DV; FOB was present.  CSW assessed for resources needs with [name]. [name] reported receiving support with WIC and foodstamps at this time. [name] reported having all essential items for infant including a carseat, and pack play for safe sleeping. CSW provided review of Sudden Infant Death Syndrome (SIDS) precautions  CSW Plan/Description:  No Further Intervention Required/No Barriers to Discharge, Sudden Infant Death Syndrome (SIDS) Education, Perinatal Mood and Anxiety Disorder (PMADs)     Barnetta Chapel, LCSW 10/26/2022, 12:38 PM

## 2022-10-26 NOTE — Lactation Note (Signed)
This note was copied from a baby's chart. Lactation Consultation Note  Patient Name: Miranda Curtis [id number] Date: 10/26/2022 Age:23 hours  Reason for consult: Follow-up assessment;Other (Comment);Term;Exclusive pumping and bottle feeding (gastric sleeve 2023)  Mother informed insurance did not cover a Stork Pump and other resources given for mother to reach out to other DME companies. Mother states she has attempted pumping and did not like it. She has decided to formula feed.  Educated mother regarding nursing and non-nursing engorgement management. Mother has a hand pump, if needed.  Mom made aware of O/P services, breastfeeding support groups, and our phone # for post-discharge questions.      Feeding Mother's Current Feeding Choice: Formula Nipple Type: Slow - flow  LATCH Score Discharge Discharge Education: Engorgement and breast care;Warning signs for feeding baby  Consult Status Consult Status: Complete Date: 10/26/22    Omar Person 10/26/2022, 1:40 PM

## 2022-10-26 NOTE — Lactation Note (Signed)
This note was copied from a baby's chart. Lactation Consultation Note  Patient Name: Miranda Curtis [id number] Date: 10/26/2022 Age:23 hours Reason for consult: Initial assessment;1st time breastfeeding;Primapara;Term;Exclusive pumping and bottle feeding Birth Parent mostly been formula feeding infant but wants to "Pump only"  and formula feed. [facility] set Birth Parent up with DEBP and explained how to use. Birth Parent was expressing colostrum in breast flange when LC left the room. Birth Parent understands to pump every 3 hours for 15 minutes on initial setting and give infant back any EBM first before formula. Birth Parent will continue to feed infant by cues, 8 to 12+ times within 24 hours, skin to skin. LC sent Stork DEBP request. Birth Parent knows that EBM is safe at room temperature for 4 hours whereas formula has to be used within 1 hour. LC gave handout "Storage and Preparation of Breast Milk". Mom made aware of O/P services, breastfeeding support groups, community resources, and our phone # for post-discharge questions.    Maternal Data Has patient been taught Hand Expression?: Yes Does the patient have breastfeeding experience prior to this delivery?: No  Feeding Mother's Current Feeding Choice: Breast Milk and Formula Nipple Type: Slow - flow  LATCH Score  Birth Parent feeding choice is "Pumping only " and formula feeding infant.                   Lactation Tools Discussed/Used Tools: Pump;Flanges Flange Size: 24 Breast pump type: Double-Electric Breast Pump Pump Education: Setup, frequency, and cleaning;Milk Storage Reason for Pumping: Birth Parent wants to "Pump only" and formula feed infant. Pumping frequency: Birth Parent will contnue to use DEBP every 3 hours for 15 minutes on inital setting.  Interventions Interventions: Skin to skin;Breast feeding basics reviewed;DEBP;Pace feeding  Discharge Pump:  (LC sent referral for Ephriam Knuckles.)  Consult Status Consult  Status: Follow-up Date: 10/26/22 Follow-up type: In-patient    Miranda Curtis 10/26/2022, 12:55 AM

## 2022-10-31 ENCOUNTER — Encounter: Payer: Medicaid Other | Admitting: Family Medicine

## 2022-11-06 NOTE — Anesthesia Postprocedure Evaluation (Signed)
Anesthesia Post Note  Patient: Miranda Curtis  Procedure(s) Performed: AN AD HOC LABOR EPIDURAL     Patient location during evaluation: Mother Baby Anesthesia Type: Epidural Level of consciousness: awake Pain management: pain level controlled Vital Signs Assessment: post-procedure vital signs reviewed and stable Respiratory status: spontaneous breathing Cardiovascular status: stable Postop Assessment: no headache, no backache, epidural receding, no apparent nausea or vomiting and patient able to bend at knees Anesthetic complications: no  No notable events documented.  Last Vitals: There were no vitals filed for this visit.  Last Pain: There were no vitals filed for this visit.               Caren Macadam

## 2022-11-21 ENCOUNTER — Telehealth (HOSPITAL_COMMUNITY): Payer: Self-pay | Admitting: *Deleted

## 2022-11-21 NOTE — Telephone Encounter (Signed)
11/21/2022  Name: Miranda Curtis MRN: [id number] DOB: [birth year]  Reason for Call:  Transition of Care Hospital Discharge Call  Contact Status: Patient Contact Status: Unable to contact (voice mail box full)  Language assistant needed:          Follow-Up Questions:    Inocente Salles Postnatal Depression Scale:  In the Past 7 Days:    PHQ2-9 Depression Scale:     Discharge Follow-up:    Post-discharge interventions: NA  Salena Saner, RN 11/21/2022 15:54

## 2022-11-22 ENCOUNTER — Ambulatory Visit: Payer: MEDICAID | Admitting: Obstetrics and Gynecology

## 2022-11-22 ENCOUNTER — Encounter: Payer: Self-pay | Admitting: Obstetrics and Gynecology

## 2022-11-22 NOTE — Progress Notes (Signed)
    Post Partum Visit Note  Miranda Curtis is a 22 y.o. [id number] s/p 7/18 SVD/intact perineum at 39wks due to chronic decreased fetal movement. Pregnancy c/b h/o bariatric surgery.   Anesthesia: epidural. Postpartum course has been uncomplicated. Baby is doing well. Baby is feeding by bottle - Similac Advance. Bleeding: occasional spotting. Bowel function is normal. Bladder function is normal. Patient is sexually active and no problems or issues. Contraception method is none. Postpartum depression screening: negative.  Patient thinks first period is about to start.    Edinburgh Postnatal Depression Scale - 11/22/22 1014       Edinburgh Postnatal Depression Scale:  In the Past 7 Days   I have been able to laugh and see the funny side of things. 0    I have looked forward with enjoyment to things. 0    I have blamed myself unnecessarily when things went wrong. 0    I have been anxious or worried for no good reason. 0    I have felt scared or panicky for no good reason. 0    Things have been getting on top of me. 0    I have been so unhappy that I have had difficulty sleeping. 0    I have felt sad or miserable. 0    I have been so unhappy that I have been crying. 0    The thought of harming myself has occurred to me. 0    Edinburgh Postnatal Depression Scale Total 0             Health Maintenance Due  Topic Date Due   HPV VACCINES (1 - 3-dose series) Never done   COVID-19 Vaccine (4 - 2023-24 season) 03/19/2022   INFLUENZA VACCINE  11/08/2022    Review of Systems A comprehensive review of systems was negative.  Objective:  BP 115/76   Pulse 85   Wt 181 lb (82.1 kg)   LMP 01/25/2022 (Exact Date)   Breastfeeding No   BMI 34.20 kg/m    General: NAD Abdomen: soft, nttp, nd CV: normal s1 and s2, no MRGs Pulm: CTAB Assessment:   Normal postpartum exam.   Plan:  Routine care. Patient to let us know if she is interested in anything for birth control in the future.  Recommend repeat pap in 3 years.  Diagnosis  Date Value Ref Range Status  04/11/2022   Final   - Negative for intraepithelial lesion or malignancy (NILM)    [place] Bing, MD Center for [facility], [facility]

## 2023-02-23 ENCOUNTER — Inpatient Hospital Stay (HOSPITAL_COMMUNITY)
Admission: AD | Admit: 2023-02-23 | Discharge: 2023-02-24 | Disposition: A | Discharge status: Home / Self Care | Payer: MEDICAID | Attending: Family Medicine | Admitting: Family Medicine

## 2023-02-23 ENCOUNTER — Inpatient Hospital Stay (HOSPITAL_COMMUNITY): Payer: MEDICAID

## 2023-02-23 DIAGNOSIS — O074 Failed attempted termination of pregnancy without complication: Secondary | ICD-10-CM | POA: Insufficient documentation

## 2023-02-23 DIAGNOSIS — F32A Depression, unspecified: Secondary | ICD-10-CM | POA: Insufficient documentation

## 2023-02-23 DIAGNOSIS — Z3A01 Less than 8 weeks gestation of pregnancy: Secondary | ICD-10-CM

## 2023-02-23 DIAGNOSIS — E669 Obesity, unspecified: Secondary | ICD-10-CM | POA: Diagnosis not present

## 2023-02-23 DIAGNOSIS — F909 Attention-deficit hyperactivity disorder, unspecified type: Secondary | ICD-10-CM | POA: Insufficient documentation

## 2023-02-23 DIAGNOSIS — F419 Anxiety disorder, unspecified: Secondary | ICD-10-CM | POA: Insufficient documentation

## 2023-02-23 DIAGNOSIS — D62 Acute posthemorrhagic anemia: Secondary | ICD-10-CM | POA: Insufficient documentation

## 2023-02-23 DIAGNOSIS — O0330 Unspecified complication following incomplete spontaneous abortion: Secondary | ICD-10-CM

## 2023-02-23 DIAGNOSIS — E119 Type 2 diabetes mellitus without complications: Secondary | ICD-10-CM | POA: Insufficient documentation

## 2023-02-23 DIAGNOSIS — Z6839 Body mass index (BMI) 39.0-39.9, adult: Secondary | ICD-10-CM | POA: Diagnosis not present

## 2023-02-23 DIAGNOSIS — O034 Incomplete spontaneous abortion without complication: Secondary | ICD-10-CM | POA: Diagnosis not present

## 2023-02-23 DIAGNOSIS — D649 Anemia, unspecified: Secondary | ICD-10-CM | POA: Diagnosis not present

## 2023-02-23 DIAGNOSIS — Z3A00 Weeks of gestation of pregnancy not specified: Secondary | ICD-10-CM | POA: Diagnosis not present

## 2023-02-23 LAB — CBC
HCT: 31.5 % — ABNORMAL LOW (ref 36.0–46.0)
Hemoglobin: 10.6 g/dL — ABNORMAL LOW (ref 12.0–15.0)
MCH: 24.7 pg — ABNORMAL LOW (ref 26.0–34.0)
MCHC: 33.7 g/dL (ref 30.0–36.0)
MCV: 73.3 fL — ABNORMAL LOW (ref 80.0–100.0)
Platelets: 365 10*3/uL (ref 150–400)
RBC: 4.3 MIL/uL (ref 3.87–5.11)
RDW: 14.9 % (ref 11.5–15.5)
WBC: 11.6 10*3/uL — ABNORMAL HIGH (ref 4.0–10.5)
nRBC: 0 % (ref 0.0–0.2)

## 2023-02-23 LAB — TYPE AND SCREEN
ABO/RH(D): AB POS
Antibody Screen: NEGATIVE

## 2023-02-23 MED ORDER — HYDROMORPHONE HCL 1 MG/ML IJ SOLN
INTRAMUSCULAR | Status: AC
  Administered 2023-02-23: 1 mg
  Filled 2023-02-23: qty 1

## 2023-02-23 MED ORDER — MIDAZOLAM HCL 2 MG/2ML IJ SOLN
INTRAMUSCULAR | Status: AC
  Filled 2023-02-23: qty 2

## 2023-02-23 MED ORDER — KETOROLAC TROMETHAMINE 30 MG/ML IJ SOLN
INTRAMUSCULAR | Status: AC
  Administered 2023-02-23: 30 mg
  Filled 2023-02-23: qty 1

## 2023-02-23 MED ORDER — FENTANYL CITRATE (PF) 250 MCG/5ML IJ SOLN
INTRAMUSCULAR | Status: AC
  Filled 2023-02-23: qty 5

## 2023-02-23 MED ORDER — LACTATED RINGERS IV BOLUS
1000.0000 mL | Freq: Once | INTRAVENOUS | Status: AC
  Administered 2023-02-23: 1000 mL via INTRAVENOUS

## 2023-02-23 MED ORDER — MISOPROSTOL 200 MCG PO TABS
1000.0000 ug | ORAL_TABLET | Freq: Once | ORAL | Status: AC
  Administered 2023-02-23: 1000 ug via ORAL
  Filled 2023-02-23: qty 5

## 2023-02-23 MED ORDER — PROPOFOL 10 MG/ML IV BOLUS
INTRAVENOUS | Status: AC
  Filled 2023-02-23: qty 20

## 2023-02-23 NOTE — MAU Note (Signed)
.  Miranda Curtis is a 22 y.o. transported by EMS to MAU reporting: heavy vaginal bleeding post misoprostol. Patient reports bleeding through a pad in less than an hour since 1900 02/23/23.  Onset of complaint: 02/23/2023   Pain score: 8/10 Vitals:   02/23/23 2223  BP: 127/77  Pulse: (!) 115  Resp: 20  SpO2: 99%     Lab orders placed from triage:
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
## 2023-02-24 ENCOUNTER — Other Ambulatory Visit: Payer: Self-pay

## 2023-02-24 ENCOUNTER — Encounter (HOSPITAL_COMMUNITY)
Admission: AD | Disposition: A | Discharge status: Home / Self Care | Payer: Self-pay | Source: Home / Self Care | Attending: Family Medicine

## 2023-02-24 ENCOUNTER — Inpatient Hospital Stay (HOSPITAL_COMMUNITY): Payer: MEDICAID | Admitting: Certified Registered Nurse Anesthetist

## 2023-02-24 ENCOUNTER — Inpatient Hospital Stay (EMERGENCY_DEPARTMENT_HOSPITAL): Payer: MEDICAID | Admitting: Certified Registered Nurse Anesthetist

## 2023-02-24 DIAGNOSIS — O034 Incomplete spontaneous abortion without complication: Secondary | ICD-10-CM

## 2023-02-24 DIAGNOSIS — D62 Acute posthemorrhagic anemia: Secondary | ICD-10-CM

## 2023-02-24 DIAGNOSIS — O0330 Unspecified complication following incomplete spontaneous abortion: Secondary | ICD-10-CM

## 2023-02-24 DIAGNOSIS — Z3A01 Less than 8 weeks gestation of pregnancy: Secondary | ICD-10-CM | POA: Diagnosis not present

## 2023-02-24 DIAGNOSIS — Z3A00 Weeks of gestation of pregnancy not specified: Secondary | ICD-10-CM

## 2023-02-24 DIAGNOSIS — O074 Failed attempted termination of pregnancy without complication: Secondary | ICD-10-CM | POA: Diagnosis not present

## 2023-02-24 HISTORY — PX: DILATION AND EVACUATION: SHX1459

## 2023-02-24 LAB — GLUCOSE, CAPILLARY: Glucose-Capillary: 132 mg/dL — ABNORMAL HIGH (ref 70–99)

## 2023-02-24 SURGERY — DILATION AND EVACUATION, UTERUS
Anesthesia: General

## 2023-02-24 MED ORDER — PROPOFOL 10 MG/ML IV BOLUS
INTRAVENOUS | Status: DC | PRN
Start: 2023-02-24 — End: 2023-02-24
  Administered 2023-02-24: 200 mg via INTRAVENOUS

## 2023-02-24 MED ORDER — OXYCODONE HCL 5 MG/5ML PO SOLN: 5.0000 mg | Freq: Once | ORAL | Status: DC | PRN

## 2023-02-24 MED ORDER — MIDAZOLAM HCL 2 MG/2ML IJ SOLN
INTRAMUSCULAR | Status: DC | PRN
  Administered 2023-02-24: 2 mg via INTRAVENOUS

## 2023-02-24 MED ORDER — DEXTROSE 5 % IV SOLN
100.0000 mg | Freq: Once | INTRAVENOUS | Status: AC
  Administered 2023-02-24: 100 mg via INTRAVENOUS
  Filled 2023-02-24: qty 100

## 2023-02-24 MED ORDER — AMISULPRIDE (ANTIEMETIC) 5 MG/2ML IV SOLN: 10.0000 mg | Freq: Once | INTRAVENOUS | Status: DC | PRN

## 2023-02-24 MED ORDER — DOXYCYCLINE HYCLATE 100 MG PO CAPS: 100.0000 mg | ORAL_CAPSULE | Freq: Two times a day (BID) | ORAL | 0 refills | Status: AC

## 2023-02-24 MED ORDER — SUCCINYLCHOLINE CHLORIDE 200 MG/10ML IV SOSY
PREFILLED_SYRINGE | INTRAVENOUS | Status: DC | PRN
  Administered 2023-02-24: 100 mg via INTRAVENOUS

## 2023-02-24 MED ORDER — ROCURONIUM BROMIDE 10 MG/ML (PF) SYRINGE
PREFILLED_SYRINGE | INTRAVENOUS | Status: DC | PRN
  Administered 2023-02-24: 30 mg via INTRAVENOUS

## 2023-02-24 MED ORDER — ACETAMINOPHEN 10 MG/ML IV SOLN: 1000.0000 mg | Freq: Once | INTRAVENOUS | Status: DC | PRN

## 2023-02-24 MED ORDER — ONDANSETRON HCL 4 MG/2ML IJ SOLN
INTRAMUSCULAR | Status: AC
  Filled 2023-02-24: qty 2

## 2023-02-24 MED ORDER — FENTANYL CITRATE (PF) 250 MCG/5ML IJ SOLN
INTRAMUSCULAR | Status: DC | PRN
  Administered 2023-02-24: 100 ug via INTRAVENOUS

## 2023-02-24 MED ORDER — SUGAMMADEX SODIUM 200 MG/2ML IV SOLN
INTRAVENOUS | Status: DC | PRN
  Administered 2023-02-24: 200 mg via INTRAVENOUS

## 2023-02-24 MED ORDER — DEXAMETHASONE SODIUM PHOSPHATE 10 MG/ML IJ SOLN
INTRAMUSCULAR | Status: AC
  Filled 2023-02-24: qty 1

## 2023-02-24 MED ORDER — LACTATED RINGERS IV SOLN: INTRAVENOUS | Status: DC | PRN

## 2023-02-24 MED ORDER — FENTANYL CITRATE (PF) 100 MCG/2ML IJ SOLN: 25.0000 ug | INTRAMUSCULAR | Status: DC | PRN

## 2023-02-24 MED ORDER — LIDOCAINE-EPINEPHRINE 1 %-1:100000 IJ SOLN
INTRAMUSCULAR | Status: DC | PRN
  Administered 2023-02-24: 20 mL

## 2023-02-24 MED ORDER — SUCCINYLCHOLINE CHLORIDE 200 MG/10ML IV SOSY
PREFILLED_SYRINGE | INTRAVENOUS | Status: AC
  Filled 2023-02-24: qty 10

## 2023-02-24 MED ORDER — IBUPROFEN 600 MG PO TABS: 600.0000 mg | ORAL_TABLET | Freq: Four times a day (QID) | ORAL | 1 refills | Status: DC | PRN

## 2023-02-24 MED ORDER — ONDANSETRON HCL 4 MG/2ML IJ SOLN
INTRAMUSCULAR | Status: DC | PRN
  Administered 2023-02-24: 4 mg via INTRAVENOUS

## 2023-02-24 MED ORDER — LIDOCAINE-EPINEPHRINE 1 %-1:100000 IJ SOLN
INTRAMUSCULAR | Status: AC
  Filled 2023-02-24: qty 1

## 2023-02-24 MED ORDER — ROCURONIUM BROMIDE 10 MG/ML (PF) SYRINGE
PREFILLED_SYRINGE | INTRAVENOUS | Status: AC
  Filled 2023-02-24: qty 10

## 2023-02-24 MED ORDER — FERROUS SULFATE 325 (65 FE) MG PO TABS: 325.0000 mg | ORAL_TABLET | ORAL | 1 refills | Status: DC

## 2023-02-24 MED ORDER — LIDOCAINE 2% (20 MG/ML) 5 ML SYRINGE
INTRAMUSCULAR | Status: DC | PRN
  Administered 2023-02-24: 60 mg via INTRAVENOUS

## 2023-02-24 MED ORDER — DEXAMETHASONE SODIUM PHOSPHATE 10 MG/ML IJ SOLN
INTRAMUSCULAR | Status: DC | PRN
  Administered 2023-02-24: 10 mg via INTRAVENOUS

## 2023-02-24 MED ORDER — OXYCODONE HCL 5 MG PO TABS: 5.0000 mg | ORAL_TABLET | Freq: Once | ORAL | Status: DC | PRN

## 2023-02-24 MED ORDER — LIDOCAINE 2% (20 MG/ML) 5 ML SYRINGE
INTRAMUSCULAR | Status: AC
  Filled 2023-02-24: qty 5

## 2023-02-24 SURGICAL SUPPLY — 17 items
CATH ROBINSON RED A/P 16FR (CATHETERS) ×1 IMPLANT
FILTER UTR ASPR ASSEMBLY (MISCELLANEOUS) ×1 IMPLANT
GLOVE BIOGEL PI IND STRL 7.0 (GLOVE) ×2 IMPLANT
GLOVE ECLIPSE 7.0 STRL STRAW (GLOVE) ×2 IMPLANT
GOWN STRL REUS W/ TWL LRG LVL3 (GOWN DISPOSABLE) ×2 IMPLANT
GOWN STRL REUS W/TWL LRG LVL3 (GOWN DISPOSABLE) ×2
HOSE CONNECTING 18IN BERKELEY (TUBING) ×1 IMPLANT
KIT BERKELEY 1ST TRI 3/8 NO TR (MISCELLANEOUS) ×1 IMPLANT
KIT BERKELEY 1ST TRIMESTER 3/8 (MISCELLANEOUS) ×1 IMPLANT
NS IRRIG 1000ML POUR BTL (IV SOLUTION) ×1 IMPLANT
PACK VAGINAL MINOR WOMEN LF (CUSTOM PROCEDURE TRAY) ×1 IMPLANT
PAD OB MATERNITY 4.3X12.25 (PERSONAL CARE ITEMS) ×1 IMPLANT
SET BERKELEY SUCTION TUBING (SUCTIONS) ×1 IMPLANT
SPIKE FLUID TRANSFER (MISCELLANEOUS) ×1 IMPLANT
TOWEL GREEN STERILE FF (TOWEL DISPOSABLE) ×2 IMPLANT
UNDERPAD 30X36 HEAVY ABSORB (UNDERPADS AND DIAPERS) ×1 IMPLANT
VACURETTE 10 RIGID CVD (CANNULA) IMPLANT

## 2023-02-24 NOTE — Anesthesia Postprocedure Evaluation (Signed)
Anesthesia Post Note  Patient: Miranda Curtis  Procedure(s) Performed: DILATATION AND EVACUATION     Patient location during evaluation: PACU Anesthesia Type: General Level of consciousness: awake Pain management: pain level controlled Vital Signs Assessment: post-procedure vital signs reviewed and stable Respiratory status: spontaneous breathing, nonlabored ventilation and respiratory function stable Cardiovascular status: blood pressure returned to baseline and stable Postop Assessment: no apparent nausea or vomiting Anesthetic complications: no   No notable events documented.  Last Vitals:  Vitals:   02/24/23 0215 02/24/23 0230  BP: 107/70 106/66  Pulse: 85 82  Resp: 20 19  Temp:  36.7 C  SpO2: 98% 100%    Last Pain:  Vitals:   02/24/23 0230  PainSc: 0-No pain                 Neely Cecena [place]

## 2023-02-24 NOTE — Anesthesia Preprocedure Evaluation (Signed)
Anesthesia Evaluation  Patient identified by MRN, date of birth, ID band Patient awake    Reviewed: Allergy & Precautions, NPO status , Patient's Chart, lab work & pertinent test results  Airway Mallampati: III  TM Distance: >3 FB Neck ROM: Full    Dental no notable dental hx.    Pulmonary neg pulmonary ROS   Pulmonary exam normal        Cardiovascular negative cardio ROS Normal cardiovascular exam     Neuro/Psych  PSYCHIATRIC DISORDERS Anxiety Depression    negative neurological ROS     GI/Hepatic negative GI ROS, Neg liver ROS,,,  Endo/Other  diabetes    Renal/GU negative Renal ROS     Musculoskeletal negative musculoskeletal ROS (+)    Abdominal  (+) + obese  Peds  (+) ADHD Hematology  (+) Blood dyscrasia, anemia   Anesthesia Other Findings incomplete abortion  Reproductive/Obstetrics                              Anesthesia Physical Anesthesia Plan  ASA: 2 and emergent  Anesthesia Plan: General   Post-op Pain Management:    Induction: Intravenous and Rapid sequence  PONV Risk Score and Plan: 3 and Ondansetron, Dexamethasone, Midazolam and Treatment may vary due to age or medical condition  Airway Management Planned: Oral ETT  Additional Equipment:   Intra-op Plan:   Post-operative Plan: Extubation in OR  Informed Consent: I have reviewed the patients History and Physical, chart, labs and discussed the procedure including the risks, benefits and alternatives for the proposed anesthesia with the patient or authorized representative who has indicated his/her understanding and acceptance.     Dental advisory given  Plan Discussed with: CRNA  Anesthesia Plan Comments:          Anesthesia Quick Evaluation

## 2023-02-24 NOTE — Transfer of Care (Signed)
Immediate Anesthesia Transfer of Care Note  Patient: Miranda Curtis  Procedure(s) Performed: DILATATION AND EVACUATION  Patient Location: PACU  Anesthesia Type:General  Level of Consciousness: drowsy and patient cooperative  Airway & Oxygen Therapy: Patient Spontanous Breathing  Post-op Assessment: Report given to RN, Post -op Vital signs reviewed and stable, and Patient moving all extremities X 4  Post vital signs: Reviewed and stable  Last Vitals:  Vitals Value Taken Time  BP 102/66 02/24/23 0111  Temp    Pulse 93 02/24/23 0114  Resp 15 02/24/23 0114  SpO2 98 % 02/24/23 0114  Vitals shown include unfiled device data.  Last Pain:  Vitals:   02/23/23 2325  PainSc: 8          Complications: No notable events documented.
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
## 2023-02-24 NOTE — Op Note (Signed)
Miranda Curtis  PROCEDURE DATE: 02/24/2023  PREOPERATIVE DIAGNOSIS: 7 week incomplete following medical abortion.  POSTOPERATIVE DIAGNOSIS: The same.  PROCEDURE:    Suction Dilation and Evacuation.  SURGEON:  Reva Bores  ANESTHESIA: Leonides Grills, MD  INDICATIONS: 22 y.o. G4P2022with incomplete AB at [redacted] weeks gestation, with hemorrhage, needing surgical completion.  Risks of surgery were discussed with the patient including but not limited to: bleeding which may require transfusion; infection which may require antibiotics; injury to uterus or surrounding organs;need for additional procedures including laparotomy or laparoscopy; possibility of intrauterine scarring which may impair future fertility; and other postoperative/anesthesia complications. Written informed consent was obtained.    FINDINGS:  A 10 week size anteverted uterus, moderate amounts of products of conception, specimen sent to pathology.  ANESTHESIA:   GETT, paracervical block.  ESTIMATED BLOOD LOSS:  Less than 20 ml.  SPECIMENS:  Products of conception sent to pathology  COMPLICATIONS:  None immediate.  PROCEDURE DETAILS:  The patient received intravenous antibiotics while in the preoperative area.  She was then taken to the operating room where general anesthesia was administered and was found to be adequate.  After an adequate timeout was performed, she was placed in the dorsal lithotomy position and examined; then prepped and draped in the sterile manner.   Her bladder was catheterized for an unmeasured amount of clear, yellow urine. A vaginal speculum was then placed in the patient's vagina and a single tooth tenaculum was applied to the anterior lip of the cervix.  A paracervical block using 1% Lidocaine with Epinephrine was administered. The cervix was gently dilated to accommodate a 10 mm suction curette that was gently advanced to the uterine fundus.  The suction device was then activated and curette slowly  rotated to clear the uterus of products of conception.  A sharp curettage was then performed to confirm complete emptying of the uterus.There was minimal bleeding noted and the tenaculum removed with good hemostasis noted.  All insturmnent, needle and lap counts were correct x 2.The patient tolerated the procedure well.  The patient was taken to the recovery area in stable condition.  Reva Bores 02/24/2023 12:56 AM

## 2023-02-24 NOTE — Anesthesia Procedure Notes (Signed)
Procedure Name: Intubation Date/Time: 02/24/2023 12:42 AM  Performed by: Alease Medina, CRNAPre-anesthesia Checklist: Patient identified, Emergency Drugs available, Suction available and Patient being monitored Patient Re-evaluated:Patient Re-evaluated prior to induction Oxygen Delivery Method: Circle system utilized Preoxygenation: Pre-oxygenation with 100% oxygen Induction Type: IV induction, Rapid sequence and Cricoid Pressure applied Laryngoscope Size: Mac and 3 Grade View: Grade I Tube type: Oral Tube size: 7.0 mm Number of attempts: 1 Airway Equipment and Method: Stylet and Oral airway Placement Confirmation: ETT inserted through vocal cords under direct vision, positive ETCO2 and breath sounds checked- equal and bilateral Secured at: 22 cm Tube secured with: Tape Dental Injury: Teeth and Oropharynx as per pre-operative assessment

## 2023-02-24 NOTE — Progress Notes (Signed)
Patient came to PACU at 0111, doxycycline hanging. Placed on pump to finish infusion. Infusion finished at 0232. Throughout patient stay in PACU, VSS and pain controlled. Scant bleeding noted from OR and another incident of scant bleeding noted while in PACU.

## 2023-02-25 ENCOUNTER — Encounter (HOSPITAL_COMMUNITY): Payer: Self-pay | Admitting: Family Medicine

## 2023-02-26 LAB — SURGICAL PATHOLOGY

## 2023-03-12 ENCOUNTER — Other Ambulatory Visit: Payer: Self-pay

## 2023-03-12 ENCOUNTER — Encounter (HOSPITAL_COMMUNITY): Payer: Self-pay

## 2023-03-12 ENCOUNTER — Emergency Department (HOSPITAL_COMMUNITY): Payer: MEDICAID

## 2023-03-12 ENCOUNTER — Observation Stay (HOSPITAL_COMMUNITY)
Admission: EM | Admit: 2023-03-12 | Discharge: 2023-03-13 | Disposition: A | Discharge status: Home / Self Care | Payer: MEDICAID | Attending: General Surgery | Admitting: General Surgery

## 2023-03-12 DIAGNOSIS — R109 Unspecified abdominal pain: Secondary | ICD-10-CM | POA: Diagnosis present

## 2023-03-12 DIAGNOSIS — K8012 Calculus of gallbladder with acute and chronic cholecystitis without obstruction: Secondary | ICD-10-CM | POA: Diagnosis not present

## 2023-03-12 DIAGNOSIS — E119 Type 2 diabetes mellitus without complications: Secondary | ICD-10-CM | POA: Diagnosis not present

## 2023-03-12 DIAGNOSIS — R1010 Upper abdominal pain, unspecified: Secondary | ICD-10-CM

## 2023-03-12 DIAGNOSIS — Z9049 Acquired absence of other specified parts of digestive tract: Principal | ICD-10-CM

## 2023-03-12 DIAGNOSIS — R1011 Right upper quadrant pain: Secondary | ICD-10-CM | POA: Diagnosis present

## 2023-03-12 LAB — COMPREHENSIVE METABOLIC PANEL
ALT: 39 U/L (ref 0–44)
AST: 76 U/L — ABNORMAL HIGH (ref 15–41)
Albumin: 3.6 g/dL (ref 3.5–5.0)
Alkaline Phosphatase: 44 U/L (ref 38–126)
Anion gap: 7 (ref 5–15)
BUN: 15 mg/dL (ref 6–20)
CO2: 21 mmol/L — ABNORMAL LOW (ref 22–32)
Calcium: 8.2 mg/dL — ABNORMAL LOW (ref 8.9–10.3)
Chloride: 105 mmol/L (ref 98–111)
Creatinine, Ser: 0.78 mg/dL (ref 0.44–1.00)
GFR, Estimated: >60 mL/min (ref >60)
Glucose, Bld: 104 mg/dL — ABNORMAL HIGH (ref 70–99)
Potassium: 3.6 mmol/L (ref 3.5–5.1)
Sodium: 133 mmol/L — ABNORMAL LOW (ref 135–145)
Total Bilirubin: 0.7 mg/dL (ref <1.2)
Total Protein: 6.8 g/dL (ref 6.5–8.1)

## 2023-03-12 LAB — I-STAT CG4 LACTIC ACID, ED: Lactic Acid, Venous: 0.9 mmol/L (ref 0.5–1.9)

## 2023-03-12 LAB — TROPONIN I (HIGH SENSITIVITY): Troponin I (High Sensitivity): <2 ng/L (ref <18)

## 2023-03-12 LAB — APTT: aPTT: 22 s — ABNORMAL LOW (ref 24–36)

## 2023-03-12 LAB — HCG, SERUM, QUALITATIVE: Preg, Serum: POSITIVE — AB

## 2023-03-12 LAB — PROTIME-INR
INR: 1.2 (ref 0.8–1.2)
Prothrombin Time: 15.2 s (ref 11.4–15.2)

## 2023-03-12 MED ORDER — LACTATED RINGERS IV BOLUS (SEPSIS)
1000.0000 mL | Freq: Once | INTRAVENOUS | Status: AC
  Administered 2023-03-12: 1000 mL via INTRAVENOUS

## 2023-03-12 MED ORDER — PANTOPRAZOLE SODIUM 40 MG IV SOLR
40.0000 mg | Freq: Once | INTRAVENOUS | Status: AC
  Administered 2023-03-12: 40 mg via INTRAVENOUS
  Filled 2023-03-12: qty 10

## 2023-03-12 MED ORDER — SODIUM CHLORIDE 0.9 % IV SOLN
2.0000 g | Freq: Once | INTRAVENOUS | Status: AC
  Administered 2023-03-12: 2 g via INTRAVENOUS
  Filled 2023-03-12: qty 12.5

## 2023-03-12 MED ORDER — ALUM & MAG HYDROXIDE-SIMETH 200-200-20 MG/5ML PO SUSP
30.0000 mL | Freq: Once | ORAL | Status: AC
  Administered 2023-03-12: 30 mL via ORAL
  Filled 2023-03-12: qty 30

## 2023-03-12 MED ORDER — VANCOMYCIN HCL IN DEXTROSE 1-5 GM/200ML-% IV SOLN: 1000.0000 mg | Freq: Once | INTRAVENOUS | Status: DC

## 2023-03-12 MED ORDER — VANCOMYCIN HCL IN DEXTROSE 1-5 GM/200ML-% IV SOLN
1000.0000 mg | Freq: Once | INTRAVENOUS | Status: AC
  Administered 2023-03-13: 1000 mg via INTRAVENOUS
  Filled 2023-03-12: qty 200

## 2023-03-12 MED ORDER — FENTANYL CITRATE PF 50 MCG/ML IJ SOSY
50.0000 ug | PREFILLED_SYRINGE | Freq: Once | INTRAMUSCULAR | Status: AC
  Administered 2023-03-12: 50 ug via INTRAVENOUS
  Filled 2023-03-12: qty 1

## 2023-03-12 MED ORDER — METRONIDAZOLE 500 MG/100ML IV SOLN
500.0000 mg | Freq: Once | INTRAVENOUS | Status: AC
  Administered 2023-03-12: 500 mg via INTRAVENOUS
  Filled 2023-03-12: qty 100

## 2023-03-12 MED ORDER — IOHEXOL 350 MG/ML SOLN
100.0000 mL | Freq: Once | INTRAVENOUS | Status: AC | PRN
  Administered 2023-03-12: 100 mL via INTRAVENOUS

## 2023-03-12 NOTE — Progress Notes (Signed)
Elink monitoring for the code sepsis protocol.  

## 2023-03-12 NOTE — ED Triage Notes (Addendum)
BIBA c/o sudden onset right flank pain radiating to right abd and sternum. Pain described as sharp with nausea.  Patient reports emergent d&c 2 weeks ago. Bp 80/40 with ems.  NS admin with ems. Pt moaning on arrival.

## 2023-03-12 NOTE — Progress Notes (Signed)
ED Pharmacy Antibiotic Sign Off An antibiotic consult was received from an ED provider for Vancomycin and Cefepime per pharmacy dosing for sepsis. A chart review was completed to assess appropriateness.   The following one time order(s) were placed:  Cefepime 2gm IV x 1 Vancomycin 2gm IV x 1 (given as 1gm IV followed by 1gm IV)  Further antibiotic and/or antibiotic pharmacy consults should be ordered by the admitting provider if indicated.   Thank you for allowing pharmacy to be a part of this patient's care.   Maryellen Pile, [facility]  Clinical Pharmacist 03/12/23 10:20 PM

## 2023-03-12 NOTE — ED Notes (Signed)
Patient transported to CT 

## 2023-03-13 ENCOUNTER — Emergency Department (HOSPITAL_COMMUNITY): Payer: MEDICAID

## 2023-03-13 ENCOUNTER — Encounter (HOSPITAL_COMMUNITY)
Admission: EM | Disposition: A | Discharge status: Home / Self Care | Payer: Self-pay | Source: Home / Self Care | Attending: Emergency Medicine

## 2023-03-13 ENCOUNTER — Observation Stay (HOSPITAL_COMMUNITY): Payer: MEDICAID | Admitting: Anesthesiology

## 2023-03-13 ENCOUNTER — Encounter (HOSPITAL_COMMUNITY): Payer: Self-pay | Admitting: Emergency Medicine

## 2023-03-13 ENCOUNTER — Other Ambulatory Visit: Payer: Self-pay

## 2023-03-13 DIAGNOSIS — K8000 Calculus of gallbladder with acute cholecystitis without obstruction: Secondary | ICD-10-CM | POA: Diagnosis not present

## 2023-03-13 DIAGNOSIS — R109 Unspecified abdominal pain: Secondary | ICD-10-CM | POA: Diagnosis present

## 2023-03-13 HISTORY — PX: CHOLECYSTECTOMY: SHX55

## 2023-03-13 LAB — CBC WITH DIFFERENTIAL/PLATELET
Abs Immature Granulocytes: 0.05 10*3/uL (ref 0.00–0.07)
Basophils Absolute: 0 10*3/uL (ref 0.0–0.1)
Basophils Relative: 0 %
Eosinophils Absolute: 0 10*3/uL (ref 0.0–0.5)
Eosinophils Relative: 0 %
HCT: 24.6 % — ABNORMAL LOW (ref 36.0–46.0)
Hemoglobin: 7.7 g/dL — ABNORMAL LOW (ref 12.0–15.0)
Immature Granulocytes: 0 %
Lymphocytes Relative: 10 %
Lymphs Abs: 1.1 10*3/uL (ref 0.7–4.0)
MCH: 23.1 pg — ABNORMAL LOW (ref 26.0–34.0)
MCHC: 31.3 g/dL (ref 30.0–36.0)
MCV: 73.7 fL — ABNORMAL LOW (ref 80.0–100.0)
Monocytes Absolute: 0.4 10*3/uL (ref 0.1–1.0)
Monocytes Relative: 4 %
Neutro Abs: 9.8 10*3/uL — ABNORMAL HIGH (ref 1.7–7.7)
Neutrophils Relative %: 86 %
Platelets: 307 10*3/uL (ref 150–400)
RBC: 3.34 MIL/uL — ABNORMAL LOW (ref 3.87–5.11)
RDW: 15.9 % — ABNORMAL HIGH (ref 11.5–15.5)
WBC: 11.4 10*3/uL — ABNORMAL HIGH (ref 4.0–10.5)
nRBC: 0 % (ref 0.0–0.2)

## 2023-03-13 LAB — COMPREHENSIVE METABOLIC PANEL
ALT: 835 U/L — ABNORMAL HIGH (ref 0–44)
ALT: 794 U/L — ABNORMAL HIGH (ref 0–44)
AST: 1091 U/L — ABNORMAL HIGH (ref 15–41)
AST: 723 U/L — ABNORMAL HIGH (ref 15–41)
Albumin: 3.6 g/dL (ref 3.5–5.0)
Albumin: 4 g/dL (ref 3.5–5.0)
Alkaline Phosphatase: 69 U/L (ref 38–126)
Alkaline Phosphatase: 81 U/L (ref 38–126)
Anion gap: 8 (ref 5–15)
Anion gap: 11 (ref 5–15)
BUN: 11 mg/dL (ref 6–20)
BUN: 11 mg/dL (ref 6–20)
CO2: 21 mmol/L — ABNORMAL LOW (ref 22–32)
CO2: 21 mmol/L — ABNORMAL LOW (ref 22–32)
Calcium: 8.4 mg/dL — ABNORMAL LOW (ref 8.9–10.3)
Calcium: 8.5 mg/dL — ABNORMAL LOW (ref 8.9–10.3)
Chloride: 110 mmol/L (ref 98–111)
Chloride: 107 mmol/L (ref 98–111)
Creatinine, Ser: 0.61 mg/dL (ref 0.44–1.00)
Creatinine, Ser: 0.75 mg/dL (ref 0.44–1.00)
GFR, Estimated: >60 mL/min (ref >60)
GFR, Estimated: >60 mL/min (ref >60)
Glucose, Bld: 68 mg/dL — ABNORMAL LOW (ref 70–99)
Glucose, Bld: 106 mg/dL — ABNORMAL HIGH (ref 70–99)
Potassium: 3.7 mmol/L (ref 3.5–5.1)
Potassium: 3.7 mmol/L (ref 3.5–5.1)
Sodium: 139 mmol/L (ref 135–145)
Sodium: 139 mmol/L (ref 135–145)
Total Bilirubin: 1.6 mg/dL — ABNORMAL HIGH (ref <1.2)
Total Bilirubin: 1.6 mg/dL — ABNORMAL HIGH (ref <1.2)
Total Protein: 6.9 g/dL (ref 6.5–8.1)
Total Protein: 7.5 g/dL (ref 6.5–8.1)

## 2023-03-13 LAB — PREPARE RBC (CROSSMATCH)

## 2023-03-13 LAB — RESP PANEL BY RT-PCR (RSV, FLU A&B, COVID)  RVPGX2
Influenza A by PCR: NEGATIVE
Influenza B by PCR: NEGATIVE
Resp Syncytial Virus by PCR: NEGATIVE
SARS Coronavirus 2 by RT PCR: NEGATIVE

## 2023-03-13 LAB — HEMOGLOBIN AND HEMATOCRIT, BLOOD
HCT: 23.1 % — ABNORMAL LOW (ref 36.0–46.0)
HCT: 22.7 % — ABNORMAL LOW (ref 36.0–46.0)
Hemoglobin: 7.3 g/dL — ABNORMAL LOW (ref 12.0–15.0)
Hemoglobin: 7 g/dL — ABNORMAL LOW (ref 12.0–15.0)

## 2023-03-13 LAB — LIPASE, BLOOD: Lipase: 19 U/L (ref 11–51)

## 2023-03-13 LAB — TROPONIN I (HIGH SENSITIVITY): Troponin I (High Sensitivity): <2 ng/L (ref <18)

## 2023-03-13 SURGERY — LAPAROSCOPIC CHOLECYSTECTOMY WITH INTRAOPERATIVE CHOLANGIOGRAM
Anesthesia: General

## 2023-03-13 MED ORDER — SUGAMMADEX SODIUM 200 MG/2ML IV SOLN
INTRAVENOUS | Status: DC | PRN
  Administered 2023-03-13: 200 mg via INTRAVENOUS

## 2023-03-13 MED ORDER — LIDOCAINE HCL (PF) 2 % IJ SOLN
INTRAMUSCULAR | Status: AC
  Filled 2023-03-13: qty 5

## 2023-03-13 MED ORDER — ROCURONIUM BROMIDE 10 MG/ML (PF) SYRINGE
PREFILLED_SYRINGE | INTRAVENOUS | Status: AC
  Filled 2023-03-13: qty 10

## 2023-03-13 MED ORDER — MIDAZOLAM HCL 2 MG/2ML IJ SOLN
INTRAMUSCULAR | Status: AC
  Filled 2023-03-13: qty 2

## 2023-03-13 MED ORDER — ROCURONIUM BROMIDE 100 MG/10ML IV SOLN
INTRAVENOUS | Status: DC | PRN
  Administered 2023-03-13: 60 mg via INTRAVENOUS
  Administered 2023-03-13: 20 mg via INTRAVENOUS

## 2023-03-13 MED ORDER — ONDANSETRON HCL 4 MG/2ML IJ SOLN
4.0000 mg | Freq: Once | INTRAMUSCULAR | Status: AC
  Administered 2023-03-13: 4 mg via INTRAVENOUS
  Filled 2023-03-13: qty 2

## 2023-03-13 MED ORDER — DEXAMETHASONE SODIUM PHOSPHATE 10 MG/ML IJ SOLN
INTRAMUSCULAR | Status: DC | PRN
  Administered 2023-03-13: 5 mg via INTRAVENOUS

## 2023-03-13 MED ORDER — MORPHINE SULFATE (PF) 4 MG/ML IV SOLN
4.0000 mg | Freq: Once | INTRAVENOUS | Status: AC
Start: 2023-03-13 — End: 2023-03-13
  Administered 2023-03-13: 4 mg via INTRAVENOUS
  Filled 2023-03-13: qty 1

## 2023-03-13 MED ORDER — DIPHENHYDRAMINE HCL 50 MG/ML IJ SOLN
25.0000 mg | Freq: Once | INTRAMUSCULAR | Status: AC
  Administered 2023-03-13: 25 mg via INTRAVENOUS
  Filled 2023-03-13: qty 1

## 2023-03-13 MED ORDER — MIDAZOLAM HCL 5 MG/5ML IJ SOLN
INTRAMUSCULAR | Status: DC | PRN
  Administered 2023-03-13: 2 mg via INTRAVENOUS

## 2023-03-13 MED ORDER — HYDROMORPHONE HCL 1 MG/ML IJ SOLN
INTRAMUSCULAR | Status: AC
  Filled 2023-03-13: qty 1

## 2023-03-13 MED ORDER — ONDANSETRON HCL 4 MG/2ML IJ SOLN
INTRAMUSCULAR | Status: AC
  Filled 2023-03-13: qty 2

## 2023-03-13 MED ORDER — BUPIVACAINE-EPINEPHRINE 0.25% -1:200000 IJ SOLN
INTRAMUSCULAR | Status: AC
  Filled 2023-03-13: qty 1

## 2023-03-13 MED ORDER — LACTATED RINGERS IV SOLN: INTRAVENOUS | Status: DC

## 2023-03-13 MED ORDER — LIDOCAINE HCL (CARDIAC) PF 100 MG/5ML IV SOSY
PREFILLED_SYRINGE | INTRAVENOUS | Status: DC | PRN
  Administered 2023-03-13: 100 mg via INTRAVENOUS

## 2023-03-13 MED ORDER — 0.9 % SODIUM CHLORIDE (POUR BTL) OPTIME
TOPICAL | Status: DC | PRN
  Administered 2023-03-13: 1000 mL

## 2023-03-13 MED ORDER — PROPOFOL 10 MG/ML IV BOLUS
INTRAVENOUS | Status: DC | PRN
  Administered 2023-03-13: 150 mg via INTRAVENOUS

## 2023-03-13 MED ORDER — SODIUM CHLORIDE 0.9 % IV SOLN
2.0000 g | Freq: Once | INTRAVENOUS | Status: AC
  Administered 2023-03-13: 2 g via INTRAVENOUS
  Filled 2023-03-13: qty 20

## 2023-03-13 MED ORDER — BUPIVACAINE-EPINEPHRINE 0.25% -1:200000 IJ SOLN
INTRAMUSCULAR | Status: DC | PRN
  Administered 2023-03-13: 50 mL

## 2023-03-13 MED ORDER — CHLORHEXIDINE GLUCONATE 0.12 % MT SOLN
15.0000 mL | Freq: Once | OROMUCOSAL | Status: AC
  Administered 2023-03-13: 15 mL via OROMUCOSAL
  Filled 2023-03-13: qty 15

## 2023-03-13 MED ORDER — PROPOFOL 10 MG/ML IV BOLUS
INTRAVENOUS | Status: AC
  Filled 2023-03-13: qty 20

## 2023-03-13 MED ORDER — OXYCODONE HCL 5 MG PO TABS: 5.0000 mg | ORAL_TABLET | Freq: Four times a day (QID) | ORAL | 0 refills | Status: DC | PRN

## 2023-03-13 MED ORDER — SODIUM CHLORIDE 0.9 % IV SOLN
10.0000 mL/h | Freq: Once | INTRAVENOUS | Status: AC
  Administered 2023-03-13: 10 mL/h via INTRAVENOUS

## 2023-03-13 MED ORDER — FENTANYL CITRATE (PF) 100 MCG/2ML IJ SOLN
INTRAMUSCULAR | Status: AC
  Filled 2023-03-13: qty 2

## 2023-03-13 MED ORDER — DEXAMETHASONE SODIUM PHOSPHATE 10 MG/ML IJ SOLN
INTRAMUSCULAR | Status: AC
  Filled 2023-03-13: qty 1

## 2023-03-13 MED ORDER — OXYCODONE HCL 5 MG/5ML PO SOLN: 5.0000 mg | Freq: Once | ORAL | Status: AC | PRN

## 2023-03-13 MED ORDER — ONDANSETRON HCL 4 MG/2ML IJ SOLN: 4.0000 mg | Freq: Once | INTRAMUSCULAR | Status: DC | PRN

## 2023-03-13 MED ORDER — KETOROLAC TROMETHAMINE 30 MG/ML IJ SOLN
INTRAMUSCULAR | Status: AC
  Filled 2023-03-13: qty 1

## 2023-03-13 MED ORDER — ONDANSETRON HCL 4 MG/2ML IJ SOLN
INTRAMUSCULAR | Status: DC | PRN
  Administered 2023-03-13: 4 mg via INTRAVENOUS

## 2023-03-13 MED ORDER — LACTATED RINGERS IR SOLN
Status: DC | PRN
  Administered 2023-03-13: 1000 mL

## 2023-03-13 MED ORDER — SODIUM CHLORIDE 0.9% IV SOLUTION: Freq: Once | INTRAVENOUS | Status: DC

## 2023-03-13 MED ORDER — INDOCYANINE GREEN 25 MG IV SOLR
25.0000 mg | Freq: Once | INTRAVENOUS | Status: AC
  Administered 2023-03-13: 2.5 mg via TOPICAL
  Filled 2023-03-13: qty 10

## 2023-03-13 MED ORDER — FENTANYL CITRATE (PF) 100 MCG/2ML IJ SOLN
INTRAMUSCULAR | Status: DC | PRN
  Administered 2023-03-13: 100 ug via INTRAVENOUS

## 2023-03-13 MED ORDER — OXYCODONE HCL 5 MG PO TABS
5.0000 mg | ORAL_TABLET | Freq: Once | ORAL | Status: AC | PRN
  Administered 2023-03-13: 5 mg via ORAL

## 2023-03-13 MED ORDER — OXYCODONE HCL 5 MG PO TABS
ORAL_TABLET | ORAL | Status: AC
  Filled 2023-03-13: qty 1

## 2023-03-13 MED ORDER — ACETAMINOPHEN 10 MG/ML IV SOLN
1000.0000 mg | Freq: Once | INTRAVENOUS | Status: DC | PRN
  Administered 2023-03-13: 1000 mg via INTRAVENOUS

## 2023-03-13 MED ORDER — KETOROLAC TROMETHAMINE 30 MG/ML IJ SOLN: 15.0000 mg | Freq: Once | INTRAMUSCULAR | Status: DC

## 2023-03-13 MED ORDER — HYDROMORPHONE HCL 1 MG/ML IJ SOLN
0.2500 mg | INTRAMUSCULAR | Status: DC | PRN
  Administered 2023-03-13: 0.25 mg via INTRAVENOUS
  Administered 2023-03-13: 0.5 mg via INTRAVENOUS
  Administered 2023-03-13 (×2): 0.25 mg via INTRAVENOUS

## 2023-03-13 MED ORDER — ACETAMINOPHEN 10 MG/ML IV SOLN
INTRAVENOUS | Status: AC
  Filled 2023-03-13: qty 100

## 2023-03-13 SURGICAL SUPPLY — 36 items
BAG COUNTER SPONGE SURGICOUNT (BAG) ×1 IMPLANT
CANISTER SUCT 3000ML PPV (MISCELLANEOUS) ×1 IMPLANT
CHLORAPREP W/TINT 26 (MISCELLANEOUS) ×1 IMPLANT
CLIP LIGATING HEMO O LOK GREEN (MISCELLANEOUS) ×1 IMPLANT
COVER MAYO STAND XLG (MISCELLANEOUS) IMPLANT
COVER SURGICAL LIGHT HANDLE (MISCELLANEOUS) ×1 IMPLANT
DERMABOND ADVANCED .7 DNX12 (GAUZE/BANDAGES/DRESSINGS) ×1 IMPLANT
DRAPE C-ARM 42X120 X-RAY (DRAPES) IMPLANT
GLOVE BIOGEL PI MICRO STRL 6 (GLOVE) ×1 IMPLANT
GLOVE INDICATOR 6.5 STRL GRN (GLOVE) ×1 IMPLANT
GOWN STRL REUS W/ TWL LRG LVL3 (GOWN DISPOSABLE) ×1 IMPLANT
GRASPER SUT TROCAR 14GX15 (MISCELLANEOUS) ×1 IMPLANT
IRRIG SUCT STRYKERFLOW 2 WTIP (MISCELLANEOUS) ×1 IMPLANT
IRRIGATION SUCT STRKRFLW 2 WTP (MISCELLANEOUS) ×1 IMPLANT
KIT BASIN OR (CUSTOM PROCEDURE TRAY) ×1 IMPLANT
KIT IMAGING PINPOINTPAQ (MISCELLANEOUS) IMPLANT
KIT TURNOVER KIT B (KITS) ×1 IMPLANT
L-HOOK LAP DISP 36CM (ELECTROSURGICAL) ×1 IMPLANT
LHOOK LAP DISP 36CM (ELECTROSURGICAL) ×1 IMPLANT
NDL INSUFFLATION 14GA 120MM (NEEDLE) ×1 IMPLANT
NEEDLE INSUFFLATION 14GA 120MM (NEEDLE) ×1 IMPLANT
NS IRRIG 1000ML POUR BTL (IV SOLUTION) ×1 IMPLANT
PAD GROUNDING 15FT CORD (ELECTROSURGICAL) ×1 IMPLANT
PENCIL BUTTON HOLSTER BLD 10FT (ELECTRODE) ×1 IMPLANT
SCISSORS LAP 5X35 DISP (ENDOMECHANICALS) ×1 IMPLANT
SET CHOLANGIOGRAPH MIX (MISCELLANEOUS) IMPLANT
SET TUBE SMOKE EVAC HIGH FLOW (TUBING) ×1 IMPLANT
SLEEVE Z-THREAD 5X100MM (TROCAR) ×2 IMPLANT
SUT MNCRL AB 4-0 PS2 18 (SUTURE) ×1 IMPLANT
SYS BAG RETRIEVAL 10MM (BASKET) ×1 IMPLANT
SYSTEM BAG RETRIEVAL 10MM (BASKET) ×1 IMPLANT
TOWEL GREEN STERILE FF (TOWEL DISPOSABLE) ×1 IMPLANT
TRAY LAPAROSCOPIC (CUSTOM PROCEDURE TRAY) ×1 IMPLANT
TROCAR 11X100 Z THREAD (TROCAR) IMPLANT
TROCAR Z THREAD OPTICAL 12X100 (TROCAR) ×1 IMPLANT
TROCAR Z-THREAD OPTICAL 5X100M (TROCAR) ×1 IMPLANT

## 2023-03-13 NOTE — ED Provider Notes (Signed)
[place] EMERGENCY DEPARTMENT AT [facility] Provider Note   CSN: [id number] Arrival date & time: 03/12/23  2148     History  Chief Complaint  Patient presents with   Abdominal Pain    Miranda Curtis is a 22 y.o. female.  22 year old female presents today for concern of sudden onset of abdominal pain that radiates into her back as well as her chest.  Reports severe pain.  She was initially hypotensive with EMS and was given some fluids with improvement.  States she had minimal pain Saturday but that resolved completely until tonight.  Recently had incomplete abortion that required D&C.  Also has history of gastric sleeve.  Denies any other health problems.  The history is provided by the patient. No language interpreter was used.       Home Medications Prior to Admission medications   Medication Sig Start Date End Date Taking? Authorizing Provider  acetaminophen (TYLENOL) 325 MG tablet Take 2 tablets (650 mg total) by mouth every 4 (four) hours as needed for up to 30 doses for moderate pain. Patient not taking: Reported on 11/22/2022 08/09/22   Calvert Cantor, CNM  ferrous sulfate 325 (65 FE) MG tablet Take 1 tablet (325 mg total) by mouth every other day. 02/24/23   Reva Bores, MD  ibuprofen (ADVIL) 600 MG tablet Take 1 tablet (600 mg total) by mouth every 6 (six) hours. Patient not taking: Reported on 11/22/2022 10/26/22   Glee Arvin, MD  ibuprofen (ADVIL) 600 MG tablet Take 1 tablet (600 mg total) by mouth every 6 (six) hours as needed. 02/24/23   Reva Bores, MD  multivitamin prental (TRINATAL) 60-1 MG TABS tablet Take 1 tablet by mouth daily. Patient not taking: Reported on 11/22/2022 10/26/22   Glee Arvin, MD      Allergies    Reglan [metoclopramide]    Review of Systems   Review of Systems  Constitutional:  Negative for fever.  Gastrointestinal:  Positive for abdominal pain. Negative for blood in stool, diarrhea, nausea and  vomiting.  Genitourinary:  Negative for difficulty urinating, dysuria, flank pain, vaginal bleeding and vaginal discharge.  All other systems reviewed and are negative.   Physical Exam Updated Vital Signs BP 103/61   Pulse 90   Temp (!) 97.4 F (36.3 C) (Oral)   Resp 19   Wt 95 kg   SpO2 100%   BMI 39.57 kg/m  Physical Exam Vitals and nursing note reviewed.  Constitutional:      General: She is not in acute distress.    Appearance: Normal appearance. She is not ill-appearing.  HENT:     Head: Normocephalic and atraumatic.     Nose: Nose normal.  Eyes:     Conjunctiva/sclera: Conjunctivae normal.  Cardiovascular:     Rate and Rhythm: Normal rate and regular rhythm.     Heart sounds: Normal heart sounds.  Pulmonary:     Effort: Pulmonary effort is normal. No respiratory distress.  Abdominal:     General: There is no distension.     Palpations: Abdomen is soft.     Tenderness: There is abdominal tenderness. There is guarding.     Comments: Generalized abdominal tenderness with guarding  Musculoskeletal:        General: No deformity. Normal range of motion.     Cervical back: Normal range of motion.  Skin:    Findings: No rash.  Neurological:     Mental Status: She is alert.  ED Results / Procedures / Treatments   Labs (all labs ordered are listed, but only abnormal results are displayed) Labs Reviewed  COMPREHENSIVE METABOLIC PANEL - Abnormal; Notable for the following components:      Result Value   Sodium 133 (*)    CO2 21 (*)    Glucose, Bld 104 (*)    Calcium 8.2 (*)    AST 76 (*)    All other components within normal limits  APTT - Abnormal; Notable for the following components:   aPTT 22 (*)    All other components within normal limits  HCG, SERUM, QUALITATIVE - Abnormal; Notable for the following components:   Preg, Serum POSITIVE (*)    All other components within normal limits  RESP PANEL BY RT-PCR (RSV, FLU A&B, COVID)  RVPGX2  CULTURE, BLOOD  (ROUTINE X 2)  CULTURE, BLOOD (ROUTINE X 2)  PROTIME-INR  CBC WITH DIFFERENTIAL/PLATELET  CBC WITH DIFFERENTIAL/PLATELET  I-STAT CG4 LACTIC ACID, ED  TROPONIN I (HIGH SENSITIVITY)  TROPONIN I (HIGH SENSITIVITY)    EKG EKG Interpretation Date/Time:  Tuesday March 12 2023 22:11:48 EST Ventricular Rate:  79 PR Interval:  152 QRS Duration:  88 QT Interval:  379 QTC Calculation: 435 R Axis:   85  Text Interpretation: Sinus rhythm No significant change since last tracing Confirmed by Richardean Canal [phone]) on 03/12/2023 10:25:06 PM  Radiology CT Angio Chest/Abd/Pel for Dissection W and/or Wo Contrast  Result Date: 03/13/2023 CLINICAL DATA:  Acute aortic syndrome suspected. EXAM: CT ANGIOGRAPHY CHEST, ABDOMEN AND PELVIS TECHNIQUE: Non-contrast CT of the chest was initially obtained. Multidetector CT imaging through the chest, abdomen and pelvis was performed using the standard protocol during bolus administration of intravenous contrast. Multiplanar reconstructed images and MIPs were obtained and reviewed to evaluate the vascular anatomy. RADIATION DOSE REDUCTION: This exam was performed according to the departmental dose-optimization program which includes automated exposure control, adjustment of the mA and/or kV according to patient size and/or use of iterative reconstruction technique. CONTRAST:  OMNIPAQUE IOHEXOL 350 MG/ML SOLN COMPARISON:  Chest radiograph 03/12/2023 FINDINGS: CTA CHEST FINDINGS Cardiovascular: Unenhanced images of the chest demonstrate no significant aortic calcification. No acute intramural hematoma. 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 Images obtained during arterial phase after intravenous contrast material administration demonstrate normal caliber thoracic aorta. Motion artifact limits evaluation but no evidence of aortic dissection. Great vessel origins are patent. Normal heart size. No pericardial effusions. Central pulmonary  arteries are well opacified. No evidence of significant pulmonary embolus. Mediastinum/Nodes: Esophagus is decompressed. No significant lymphadenopathy. Thyroid gland is unremarkable. Lungs/Pleura: Lungs are clear. No pleural effusion or pneumothorax. Musculoskeletal: No chest wall abnormality. No acute or significant osseous findings. Review of the MIP images confirms the above findings. CTA ABDOMEN AND PELVIS FINDINGS VASCULAR Aorta: Normal caliber aorta without aneurysm, dissection, vasculitis or significant stenosis. Celiac: Patent without evidence of aneurysm, dissection, vasculitis or significant stenosis. SMA: Patent without evidence of aneurysm, dissection, vasculitis or significant stenosis. Renals: Both renal arteries are patent without evidence of aneurysm, dissection, vasculitis, fibromuscular dysplasia or significant stenosis. IMA: Patent without evidence of aneurysm, dissection, vasculitis or significant stenosis. Inflow: Patent without evidence of aneurysm, dissection, vasculitis or significant stenosis. Veins: No obvious venous abnormality within the limitations of this arterial phase study. Review of the MIP images confirms the above findings. NON-VASCULAR Hepatobiliary: No focal liver lesions. Mild gallbladder wall thickening with pericholecystic edema. This may indicate acute cholecystitis in the appropriate clinical setting. Mild Peri portal  edema may also indicate hepatitis or other inflammatory process. No bile duct dilatation. Pancreas: Unremarkable. No pancreatic ductal dilatation or surrounding inflammatory changes. Spleen: Normal in size without focal abnormality. Adrenals/Urinary Tract: Adrenal glands are unremarkable. Kidneys are normal, without renal calculi, focal lesion, or hydronephrosis. Bladder is unremarkable. Stomach/Bowel: Postoperative stomach consistent with gastric bypass. Appendix appears normal. No evidence of bowel wall thickening, distention, or inflammatory changes.  Lymphatic: No significant lymphadenopathy. Reproductive: Uterus and bilateral adnexa are unremarkable. Other: No abdominal wall hernia or abnormality. No abdominopelvic ascites. Musculoskeletal: No acute or significant osseous findings. Review of the MIP images confirms the above findings. IMPRESSION: 1. No evidence of aneurysm or dissection involving the thoracic or abdominal aorta. Pain 2 no evidence of significant pulmonary embolus. 2. Lungs are clear. 3. Pericholecystic and periportal edema may indicate inflammatory process such as cholecystitis/hepatitis. Correlate clinically. No cholelithiasis. Electronically Signed   By: Burman Nieves M.D.   On: 03/13/2023 00:07   DG Chest Port 1 View  Result Date: 03/12/2023 CLINICAL DATA:  Right flank pain radiating to the right abdomen and sternum. EXAM: PORTABLE CHEST 1 VIEW COMPARISON:  November 26, 2014 FINDINGS: The heart size and mediastinal contours are within normal limits. Both lungs are clear. The visualized skeletal structures are unremarkable. IMPRESSION: No acute cardiopulmonary disease. Electronically Signed   By: Aram Candela M.D.   On: 03/12/2023 22:32    Procedures .Critical Care  Performed by: Marita Kansas, PA-C Authorized by: Marita Kansas, PA-C   Critical care provider statement:    Critical care time (minutes):  40   Critical care was necessary to treat or prevent imminent or life-threatening deterioration of the following conditions:  Sepsis   Critical care was time spent personally by me on the following activities:  Development of treatment plan with patient or surrogate, discussions with consultants, evaluation of patient's response to treatment, examination of patient, ordering and review of laboratory studies, ordering and review of radiographic studies, ordering and performing treatments and interventions, pulse oximetry, re-evaluation of patient's condition and review of old charts     Medications Ordered in ED Medications   metroNIDAZOLE (FLAGYL) IVPB 500 mg (500 mg Intravenous New Bag/Given 03/12/23 2333)  vancomycin (VANCOCIN) IVPB 1000 mg/200 mL premix (1,000 mg Intravenous New Bag/Given 03/13/23 0005)    Followed by  vancomycin (VANCOCIN) IVPB 1000 mg/200 mL premix (has no administration in time range)  lactated ringers bolus 1,000 mL (1,000 mLs Intravenous New Bag/Given 03/12/23 2243)    And  lactated ringers bolus 1,000 mL (1,000 mLs Intravenous New Bag/Given 03/12/23 2333)    And  lactated ringers bolus 1,000 mL (1,000 mLs Intravenous New Bag/Given 03/12/23 2333)  ceFEPIme (MAXIPIME) 2 g in sodium chloride 0.9 % 100 mL IVPB (0 g Intravenous Stopped 03/12/23 2354)  fentaNYL (SUBLIMAZE) injection 50 mcg (50 mcg Intravenous Given 03/12/23 2254)  pantoprazole (PROTONIX) injection 40 mg (40 mg Intravenous Given 03/12/23 2255)  alum & mag hydroxide-simeth (MAALOX/MYLANTA) 200-200-20 MG/5ML suspension 30 mL (30 mLs Oral Given 03/12/23 2334)  iohexol (OMNIPAQUE) 350 MG/ML injection 100 mL (100 mLs Intravenous Contrast Given 03/12/23 2349)    ED Course/ Medical Decision Making/ A&P Clinical Course as of 03/13/23 0415  Wed Mar 13, 2023  0350 This is a 22 year old female presenting to ED with complaint of acute onset of epigastric pain, reporting she had a similar episode a few days ago that went away.  Patient was initially seen by earlier ED provider as well as PA provider, over there was  concern for softer blood pressure, severe epigastric pain rating to the back.  Patient had a CT dissection study performed which did not show acute dissection but did show concern for potential edematous gallbladder.  Subsequent right upper quadrant ultrasound shows gallstones with a thickened gallbladder wall.  She had diffuse abdominal tenderness but more so localized to the epigastrium and the right upper quadrant with a negative Murphy sign on exam.  Labs show white blood cell count of 11.4, lactate wnl, LFT's and lipase wnl.  Hcg is  positive but pt had D&E performed 2 weeks ago per my report.  She has noted to have some anemia here with a hemoglobin of 7.7, which is dropped over the past 2 to 3 weeks since her procedure, last check prior to her procedure.  This may have been due to blood loss during the procedure.  She does not have active GU hemorrhage or concern for ongoing vaginal bleeding.  She is not on anticoagulation.  She does have a prior history of anemia.  We repeated the H&H without significant change or drop.  At this time she is stable for surgical admission, the case was discussed with Dr. Dwain Sarna from general surgery.  We'll trend hgb tomorrow morning as well, no indication for blood transfusion emergently at this time.  She has also received antibiotics for initial presentation for possible sepsis, but with a normal lactate and immediate improvement of her blood pressure, I have a low suspicion for septic shock or severe sepsis. [MT]    Clinical Course User Index [MT] Trifan, Kermit Balo, MD                                 Medical Decision Making Amount and/or Complexity of Data Reviewed Labs: ordered. Radiology: ordered. ECG/medicine tests: ordered.  Risk OTC drugs. Prescription drug management. Decision regarding hospitalization.   Medical Decision Making / ED Course   This patient presents to the ED for concern of abdominal pain, back pain, chest pain, this involves an extensive number of treatment options, and is a complaint that carries with it a high risk of complications and morbidity.  The differential diagnosis includes dissection, sepsis, acute abdomen, perforation  MDM: 22 year old female presents today for concern of sudden onset abdominal pain that radiated to her back and chest.  Denies history of gastric sleeve, recent D&C from incomplete abortion.  Given hypotension and severe pain we will start sepsis workup.  CBC shows mild leukocytosis but no significant left shift.  Anemia of 7.7  dropped from recent 10.  This was after almost 3 L of fluid bolus.  Repeat was 7.3.  Will again repeat this at 7 AM.  Lipase within normal.  CMP without acute kidney injury, electrolytes without significant derangements.  Respiratory panel negative.  Troponin negative.  Serum pregnancy test positive but likely due to recent miscarriage.  CT obtained.  No acute finding.  Ambiguous gallbladder changes.  Right upper quadrant ultrasound obtained.  Shows concern for cholecystitis.  Discussed with surgeon by attending.  Surgeon agrees to admit patient as long as there is no concern for GI bleed.  Repeat hemoglobin stable.  Will admit to surgeon.  Repeat hemoglobin ordered.  EKG without acute ischemic change.  Temporary admit orders placed.  Lab Tests: -I ordered, reviewed, and interpreted labs.   The pertinent results include:   Labs Reviewed  COMPREHENSIVE METABOLIC PANEL - Abnormal; Notable for the following  components:      Result Value   Sodium 133 (*)    CO2 21 (*)    Glucose, Bld 104 (*)    Calcium 8.2 (*)    AST 76 (*)    All other components within normal limits  APTT - Abnormal; Notable for the following components:   aPTT 22 (*)    All other components within normal limits  HCG, SERUM, QUALITATIVE - Abnormal; Notable for the following components:   Preg, Serum POSITIVE (*)    All other components within normal limits  CBC WITH DIFFERENTIAL/PLATELET - Abnormal; Notable for the following components:   WBC 11.4 (*)    RBC 3.34 (*)    Hemoglobin 7.7 (*)    HCT 24.6 (*)    MCV 73.7 (*)    MCH 23.1 (*)    RDW 15.9 (*)    Neutro Abs 9.8 (*)    All other components within normal limits  HEMOGLOBIN AND HEMATOCRIT, BLOOD - Abnormal; Notable for the following components:   Hemoglobin 7.3 (*)    HCT 23.1 (*)    All other components within normal limits  RESP PANEL BY RT-PCR (RSV, FLU A&B, COVID)  RVPGX2  CULTURE, BLOOD (ROUTINE X 2)  CULTURE, BLOOD (ROUTINE X 2)  PROTIME-INR  LIPASE,  BLOOD  CBC WITH DIFFERENTIAL/PLATELET  HEMOGLOBIN AND HEMATOCRIT, BLOOD  I-STAT CG4 LACTIC ACID, ED  TROPONIN I (HIGH SENSITIVITY)  TROPONIN I (HIGH SENSITIVITY)      EKG  EKG Interpretation Date/Time:  Tuesday March 12 2023 22:11:48 EST Ventricular Rate:  79 PR Interval:  152 QRS Duration:  88 QT Interval:  379 QTC Calculation: 435 R Axis:   85  Text Interpretation: Sinus rhythm No significant change since last tracing Confirmed by Richardean Canal [phone]) on 03/12/2023 10:25:06 PM         Imaging Studies ordered: I ordered imaging studies including ct dissection study, RUQ [place] I independently visualized and interpreted imaging. I agree with the radiologist interpretation   Medicines ordered and prescription drug management: Meds ordered this encounter  Medications   AND Linked Order Group    lactated ringers bolus 1,000 mL     Order Specific Question:   Total Body Weight basis for 30 mL/kg  bolus delivery     Answer:   95 kg    lactated ringers bolus 1,000 mL     Order Specific Question:   Total Body Weight basis for 30 mL/kg  bolus delivery     Answer:   95 kg    lactated ringers bolus 1,000 mL     Order Specific Question:   Total Body Weight basis for 30 mL/kg  bolus delivery     Answer:   95 kg   ceFEPIme (MAXIPIME) 2 g in sodium chloride 0.9 % 100 mL IVPB    Order Specific Question:   Antibiotic Indication:    Answer:   Other Indication (list below)    Order Specific Question:   Other Indication:    Answer:   Unknown source   metroNIDAZOLE (FLAGYL) IVPB 500 mg    Order Specific Question:   Antibiotic Indication:    Answer:   Other Indication (list below)    Order Specific Question:   Other Indication:    Answer:   Unknown source   DISCONTD: vancomycin (VANCOCIN) IVPB 1000 mg/200 mL premix    Order Specific Question:   Indication:    Answer:   Other Indication (list below)  Order Specific Question:   Other Indication:    Answer:   Unknown source    FOLLOWED BY Linked Order Group    vancomycin (VANCOCIN) IVPB 1000 mg/200 mL premix     Order Specific Question:   Indication:     Answer:   Sepsis    vancomycin (VANCOCIN) IVPB 1000 mg/200 mL premix     Order Specific Question:   Indication:     Answer:   Sepsis   fentaNYL (SUBLIMAZE) injection 50 mcg   pantoprazole (PROTONIX) injection 40 mg   alum & mag hydroxide-simeth (MAALOX/MYLANTA) 200-200-20 MG/5ML suspension 30 mL   iohexol (OMNIPAQUE) 350 MG/ML injection 100 mL   morphine (PF) 4 MG/ML injection 4 mg   ondansetron (ZOFRAN) injection 4 mg   diphenhydrAMINE (BENADRYL) injection 25 mg    -I have reviewed the patients home medicines and have made adjustments as needed  Critical interventions Sepsis work up, surgical consult, antibiotics, fluid resuscitation  Consultations Obtained: I requested consultation with the general surg,  and discussed lab and imaging findings as well as pertinent plan - they recommend: as above   Cardiac Monitoring: The patient was maintained on a cardiac monitor.  I personally viewed and interpreted the cardiac monitored which showed an underlying rhythm of: NSR  Reevaluation: After the interventions noted above, I reevaluated the patient and found that they have :stayed the same  Co morbidities that complicate the patient evaluation  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-has resolved following surgery   Eczema    History of depression 11/18/2017   History of diabetes mellitus 04/11/2022   Maternal varicella, non-immune 05/09/2017   Missed abortion 04/07/2019      Dispostion: Admitted to general surg service.   Final Clinical Impression(s) / ED Diagnoses Final diagnoses:  Pain of upper abdomen    Rx / DC Orders ED Discharge Orders     None          Marita Kansas, PA-C 03/13/23 0423    Terald Sleeper, MD 03/13/23 [phone]

## 2023-03-13 NOTE — Progress Notes (Signed)
  [facility] Pre-op Review 1.Surgical orders: Consent orders: order in Epic Consent signed: Patient alert and oriented: Antibiotic: Rocephin 2G Pre-meds:  2.  Pre-procedure checklist completed: Not documented  3.  NPO: Not documented  4.  CHG Bath completed: Not documented      Belongings removed and placed in clean gown: Not documented  5.  Labs Performed: CBC    Abnormal 03/13/23 CMP/BMP   In progress PT/INR  WNL 03/12/23 HA1C Type and Screen In progress Surgical PCR:  NA Pregnancy:  POSITIVE 03/12/23 D/C 2 weeks ago EKG:    03/13/23 Chest x-ray:  CT chest/abd 03/13/23   6.  Recent H&P or progress note if inpatient:  03/13/23  7.  Language Barrier:  None  8.  Vital Signs:  Stable Oxygen:  RA Tele:   Yes Can the patient travel without Tele  9.  Medications: Cardiac Drips:  NA Pain Medications: Fentanyl 2254 Morphine 4mg  IV 0139 Beta Blocker:   NA Anticoagulants:  NA GLP1:   NA  10. IV access:   20 Right AC     20 Right hand  11. Diabetic:    Per note, history of DM prior to gastric sleeve which has resolved since surgery.

## 2023-03-13 NOTE — H&P (Signed)
H&P Note  Miranda Curtis 20-Jul-2000  [id number].    Requesting MD: Alvester Chou, MD Chief Complaint/Reason for Consult: Acute cholecystitis  HPI:  Patient is a 22 year old female who presented to the ED with abdominal pain since Saturday. Pain is RUQ and epigastric and radiates to chest. Initially pain was mild and dull but intermittently becomes significantly more intense. She reports it has gradually worsened over the last few days. Associated nausea with eating and pain seems to be exacerbated by eating as well. She denies vomiting. She denies fever, chills, urinary symptoms. PMH significant for recent D&E 3 weeks ago after attempted medical abortion with misoprostol with heavy bleeding related to this. She has a 61 year old child and infant that she delivered vaginally in July of this year. 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 She denies medication allergies although intolerance listed to reglan. 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 Occasional alcohol use and denies illicit drug use. She is not currently breast feeding.   ROS: Negative other than HPI  Family History  Problem Relation Age of Onset   Diabetes Mother    Healthy Mother    Sickle cell trait Mother    Sickle cell trait Father    Rashes / Skin problems Father        eczema   Asthma Sister    Rashes / Skin problems Sister        eczema   Rashes / Skin problems Sister        eczema   Rashes / Skin problems Sister        eczema   Sickle cell anemia Sister    Rashes / Skin problems Sister        eczema   Autism Brother    Healthy Brother    Diabetes Maternal Grandmother    Hypertension Maternal Grandmother    Cancer Maternal Grandfather        prostate   Cancer Paternal Grandmother 47       lung   Healthy Paternal Grandfather    Ovarian cancer Neg Hx    Colon cancer Neg Hx    Breast cancer Neg Hx     Past Medical History:  Diagnosis Date   Anovulation  06/08/2020   Anxiety    Attention deficit hyperactivity disorder (ADHD)    Depression    Diabetes mellitus without complication (HCC)    states prior to gastric sleeve surgery-has resolved following surgery   Eczema    History of depression 11/18/2017   History of diabetes mellitus 04/11/2022   Maternal varicella, non-immune 05/09/2017   Missed abortion 04/07/2019    Past Surgical History:  Procedure Laterality Date   DILATION AND EVACUATION N/A 02/24/2023   Procedure: DILATATION AND EVACUATION;  Surgeon: Reva Bores, MD;  Location: [facility] OR;  Service: Gynecology;  Laterality: N/A;   SLEEVE GASTROPLASTY  10/12/2021   UPPER GASTROINTESTINAL ENDOSCOPY N/A     Social History:  reports that she has never smoked. She has never used smokeless tobacco. She reports that she does not drink alcohol and does not use drugs.  Allergies:  Allergies  Allergen Reactions   Reglan [Metoclopramide] Anxiety and Other (See Comments)    Severe panic attack    (Not in a hospital admission)   Blood pressure 99/64, pulse 73, temperature 98 F (36.7 C), temperature source Oral, resp. rate 15, weight 95 kg, SpO2 100%, not  currently breastfeeding. Physical Exam:  General: pleasant, WD, overweight female who is laying in bed in NAD HEENT: head is normocephalic, atraumatic.  Sclera are anicteric. EOMI.  Ears and nose without any masses or lesions.  Mouth is pink and moist Heart: regular, rate, and rhythm. Palpable radial and pedal pulses bilaterally Lungs: Respiratory effort nonlabored Abd: soft, TTP in RUQ and epigastrium, ND, laparoscopic scars well healed MS: all 4 extremities are symmetrical with no cyanosis, clubbing, or edema. Skin: warm and dry with no masses, lesions, or rashes Neuro: Cranial nerves 2-12 grossly intact, sensation is normal throughout Psych: A&Ox3 with an appropriate affect.   Results for orders placed or performed during the hospital encounter of 03/12/23 (from the past 48  hour(s))  Comprehensive metabolic panel     Status: Abnormal   Collection Time: 03/12/23 10:42 PM  Result Value Ref Range   Sodium 133 (L) 135 - 145 mmol/L   Potassium 3.6 3.5 - 5.1 mmol/L   Chloride 105 98 - 111 mmol/L   CO2 21 (L) 22 - 32 mmol/L   Glucose, Bld 104 (H) 70 - 99 mg/dL    Comment: Glucose reference range applies only to samples taken after fasting for at least 8 hours.   BUN 15 6 - 20 mg/dL   Creatinine, Ser 1.09 0.44 - 1.00 mg/dL   Calcium 8.2 (L) 8.9 - 10.3 mg/dL   Total Protein 6.8 6.5 - 8.1 g/dL   Albumin 3.6 3.5 - 5.0 g/dL   AST 76 (H) 15 - 41 U/L   ALT 39 0 - 44 U/L   Alkaline Phosphatase 44 38 - 126 U/L   Total Bilirubin 0.7 <1.2 mg/dL   GFR, Estimated >32 >35 mL/min    Comment: (NOTE) Calculated using the CKD-EPI Creatinine Equation (2021)    Anion gap 7 5 - 15    Comment: Performed at [facility], [address], [place], [place] [address]  Protime-INR     Status: None   Collection Time: 03/12/23 10:42 PM  Result Value Ref Range   Prothrombin Time 15.2 11.4 - 15.2 seconds   INR 1.2 0.8 - 1.2    Comment: (NOTE) INR goal varies based on device and disease states. Performed at [facility], [address], [place], [place] [address]   APTT     Status: Abnormal   Collection Time: 03/12/23 10:42 PM  Result Value Ref Range   aPTT 22 (L) 24 - 36 seconds    Comment: Performed at [facility], [address], [place], [place] [address]  hCG, serum, qualitative     Status: Abnormal   Collection Time: 03/12/23 10:42 PM  Result Value Ref Range   Preg, Serum POSITIVE (A) NEGATIVE    Comment:        THE SENSITIVITY OF THIS METHODOLOGY IS >10 mIU/mL. REPEATED TO VERIFY Performed at [facility], [address] [place], [place] [address]   Troponin I (High Sensitivity)     Status: None   Collection Time: 03/12/23 10:42 PM  Result Value Ref Range   Troponin I (High Sensitivity) <2  <18 ng/L    Comment: (NOTE) Elevated high sensitivity troponin I (hsTnI) values and significant  changes across serial measurements may suggest ACS but many other  chronic and acute conditions are known to elevate hsTnI results.  Refer to the "Links" section for chest pain algorithms and additional  guidance. Performed at [facility], [address] [place], [place] [address]   Resp panel by RT-PCR (RSV, Flu A&B, Covid) Anterior Nasal Swab     Status: None   Collection Time: 03/12/23 10:43 PM   Specimen: Anterior Nasal Swab  Result Value Ref Range   SARS Coronavirus 2 by RT PCR NEGATIVE NEGATIVE    Comment: (NOTE) SARS-CoV-2 target nucleic acids are NOT DETECTED.  The SARS-CoV-2 RNA is generally detectable in upper respiratory specimens during the acute phase of infection. The lowest concentration of SARS-CoV-2 viral copies this assay can detect is 138 copies/mL. A negative result does not preclude SARS-Cov-2 infection and should not be used as the sole basis for treatment or other patient management decisions. A negative result may occur with  improper specimen collection/handling, submission of specimen other than nasopharyngeal swab, presence of viral mutation(s) within the areas targeted by this assay, and inadequate number of viral copies(<138 copies/mL). A negative result must be combined with clinical observations, patient history, and epidemiological information. The expected result is Negative.  Fact Sheet for Patients:  BloggerCourse.com  Fact Sheet for Healthcare Providers:  SeriousBroker.it  This test is no t yet approved or cleared by the [place] FDA and  has been authorized for detection and/or diagnosis of SARS-CoV-2 by FDA under an Emergency Use Authorization (EUA). This EUA will remain  in effect (meaning this test can be used) for the duration of the COVID-19 declaration under  Section 564(b)(1) of the Act, 21 U.S.C.section 360bbb-3(b)(1), unless the authorization is terminated  or revoked sooner.       Influenza A by PCR NEGATIVE NEGATIVE   Influenza B by PCR NEGATIVE NEGATIVE    Comment: (NOTE) The Xpert Xpress SARS-CoV-2/FLU/RSV plus assay is intended as an aid in the diagnosis of influenza from Nasopharyngeal swab specimens and should not be used as a sole basis for treatment. Nasal washings and aspirates are unacceptable for Xpert Xpress SARS-CoV-2/FLU/RSV testing.  Fact Sheet for Patients: BloggerCourse.com  Fact Sheet for Healthcare Providers: SeriousBroker.it  This test is not yet approved or cleared by the [place] FDA and has been authorized for detection and/or diagnosis of SARS-CoV-2 by FDA under an Emergency Use Authorization (EUA). This EUA will remain in effect (meaning this test can be used) for the duration of the COVID-19 declaration under Section 564(b)(1) of the Act, 21 U.S.C. section 360bbb-3(b)(1), unless the authorization is terminated or revoked.     Resp Syncytial Virus by PCR NEGATIVE NEGATIVE    Comment: (NOTE) Fact Sheet for Patients: BloggerCourse.com  Fact Sheet for Healthcare Providers: SeriousBroker.it  This test is not yet approved or cleared by the [place] FDA and has been authorized for detection and/or diagnosis of SARS-CoV-2 by FDA under an Emergency Use Authorization (EUA). This EUA will remain in effect (meaning this test can be used) for the duration of the COVID-19 declaration under Section 564(b)(1) of the Act, 21 U.S.C. section 360bbb-3(b)(1), unless the authorization is terminated or revoked.  Performed at [facility], [address] [place], [place] [address]   I-Stat Lactic Acid, ED     Status: None   Collection Time: 03/12/23 10:50 PM  Result Value Ref Range    Lactic Acid, Venous 0.9 0.5 - 1.9 mmol/L  Troponin I (High Sensitivity)     Status: None   Collection Time: 03/13/23 12:29 AM  Result Value Ref Range   Troponin I (High Sensitivity) <2 <18 ng/L    Comment: (NOTE) Elevated high sensitivity troponin I (hsTnI) values and significant  changes across serial measurements may suggest ACS but many other  chronic and acute conditions are known to elevate hsTnI results.  Refer to the "Links" section for chest pain algorithms and additional  guidance. Performed at [facility], [address], [place], [place] [address]   Lipase, blood     Status: None   Collection Time: 03/13/23 12:29 AM  Result Value Ref Range   Lipase 19 11 - 51 U/L    Comment: Performed at [facility], [address] [place], [place] [address]  CBC with Differential/Platelet     Status: Abnormal   Collection Time: 03/13/23  1:00 AM  Result Value Ref Range   WBC 11.4 (H) 4.0 - 10.5 K/uL   RBC 3.34 (L) 3.87 - 5.11 MIL/uL   Hemoglobin 7.7 (L) 12.0 - 15.0 g/dL    Comment: Reticulocyte Hemoglobin testing may be clinically indicated, consider ordering this additional test [id number]    HCT 24.6 (L) 36.0 - 46.0 %   MCV 73.7 (L) 80.0 - 100.0 fL   MCH 23.1 (L) 26.0 - 34.0 pg   MCHC 31.3 30.0 - 36.0 g/dL   RDW 78.2 (H) 95.6 - 21.3 %   Platelets 307 150 - 400 K/uL   nRBC 0.0 0.0 - 0.2 %   Neutrophils Relative % 86 %   Neutro Abs 9.8 (H) 1.7 - 7.7 K/uL   Lymphocytes Relative 10 %   Lymphs Abs 1.1 0.7 - 4.0 K/uL   Monocytes Relative 4 %   Monocytes Absolute 0.4 0.1 - 1.0 K/uL   Eosinophils Relative 0 %   Eosinophils Absolute 0.0 0.0 - 0.5 K/uL   Basophils Relative 0 %   Basophils Absolute 0.0 0.0 - 0.1 K/uL   Immature Granulocytes 0 %   Abs Immature Granulocytes 0.05 0.00 - 0.07 K/uL    Comment: Performed at [facility], [address] [place], [place] [address]  Hemoglobin and hematocrit, blood     Status:  Abnormal   Collection Time: 03/13/23  3:24 AM  Result Value Ref Range   Hemoglobin 7.3 (L) 12.0 - 15.0 g/dL   HCT 84.6 (L) 96.2 - 95.2 %    Comment: Performed at [facility], [address], [place], [place] [address]  Hemoglobin and hematocrit, blood     Status: Abnormal   Collection Time: 03/13/23  6:00 AM  Result Value Ref Range   Hemoglobin 7.0 (L) 12.0 - 15.0 g/dL   HCT 44.0 (L) 10.2 - 72.5 %    Comment: Performed at [facility], [address], [place], [place] [address]   US Abdomen Limited RUQ (LIVER/GB)  Result Date: 03/13/2023 CLINICAL DATA:  Right upper quadrant pain. EXAM: ULTRASOUND ABDOMEN LIMITED RIGHT UPPER QUADRANT COMPARISON:  None Available. FINDINGS: Gallbladder: Ill-defined gallstones are seen within the gallbladder lumen. The largest measures approximately 5.3 mm. A thickened, edematous gallbladder wall is noted (6.4 mm). No pericholecystic fluid is seen. No sonographic Murphy sign noted by sonographer. Common bile duct: Diameter: 5.6 mm Liver: No focal lesion identified. Within normal limits in parenchymal echogenicity. Portal vein is patent on color Doppler imaging with normal direction of blood flow towards the liver. Other: None. IMPRESSION: Cholelithiasis and a thickened, edematous gallbladder wall, without additional findings to suggest the presence of acute cholecystitis. Electronically Signed   By: Aram Candela M.D.   On: 03/13/2023 01:11   CT Angio Chest/Abd/Pel for Dissection W and/or Wo Contrast  Result Date:  03/13/2023 CLINICAL DATA:  Acute aortic syndrome suspected. EXAM: CT ANGIOGRAPHY CHEST, ABDOMEN AND PELVIS TECHNIQUE: Non-contrast CT of the chest was initially obtained. Multidetector CT imaging through the chest, abdomen and pelvis was performed using the standard protocol during bolus administration of intravenous contrast. Multiplanar reconstructed images and MIPs were obtained and reviewed to evaluate the  vascular anatomy. RADIATION DOSE REDUCTION: This exam was performed according to the departmental dose-optimization program which includes automated exposure control, adjustment of the mA and/or kV according to patient size and/or use of iterative reconstruction technique. CONTRAST:  OMNIPAQUE IOHEXOL 350 MG/ML SOLN COMPARISON:  Chest radiograph 03/12/2023 FINDINGS: CTA CHEST FINDINGS Cardiovascular: Unenhanced images of the chest demonstrate no significant aortic calcification. No acute intramural hematoma. 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 Images obtained during arterial phase after intravenous contrast material administration demonstrate normal caliber thoracic aorta. Motion artifact limits evaluation but no evidence of aortic dissection. Great vessel origins are patent. Normal heart size. No pericardial effusions. Central pulmonary arteries are well opacified. No evidence of significant pulmonary embolus. Mediastinum/Nodes: Esophagus is decompressed. No significant lymphadenopathy. Thyroid gland is unremarkable. Lungs/Pleura: Lungs are clear. No pleural effusion or pneumothorax. Musculoskeletal: No chest wall abnormality. No acute or significant osseous findings. Review of the MIP images confirms the above findings. CTA ABDOMEN AND PELVIS FINDINGS VASCULAR Aorta: Normal caliber aorta without aneurysm, dissection, vasculitis or significant stenosis. Celiac: Patent without evidence of aneurysm, dissection, vasculitis or significant stenosis. SMA: Patent without evidence of aneurysm, dissection, vasculitis or significant stenosis. Renals: Both renal arteries are patent without evidence of aneurysm, dissection, vasculitis, fibromuscular dysplasia or significant stenosis. IMA: Patent without evidence of aneurysm, dissection, vasculitis or significant stenosis. Inflow: Patent without evidence of aneurysm, dissection, vasculitis or significant stenosis. Veins: No obvious venous  abnormality within the limitations of this arterial phase study. Review of the MIP images confirms the above findings. NON-VASCULAR Hepatobiliary: No focal liver lesions. Mild gallbladder wall thickening with pericholecystic edema. This may indicate acute cholecystitis in the appropriate clinical setting. Mild Peri portal edema may also indicate hepatitis or other inflammatory process. No bile duct dilatation. Pancreas: Unremarkable. No pancreatic ductal dilatation or surrounding inflammatory changes. Spleen: Normal in size without focal abnormality. Adrenals/Urinary Tract: Adrenal glands are unremarkable. Kidneys are normal, without renal calculi, focal lesion, or hydronephrosis. Bladder is unremarkable. Stomach/Bowel: Postoperative stomach consistent with gastric bypass. Appendix appears normal. No evidence of bowel wall thickening, distention, or inflammatory changes. Lymphatic: No significant lymphadenopathy. Reproductive: Uterus and bilateral adnexa are unremarkable. Other: No abdominal wall hernia or abnormality. No abdominopelvic ascites. Musculoskeletal: No acute or significant osseous findings. Review of the MIP images confirms the above findings. IMPRESSION: 1. No evidence of aneurysm or dissection involving the thoracic or abdominal aorta. Pain 2 no evidence of significant pulmonary embolus. 2. Lungs are clear. 3. Pericholecystic and periportal edema may indicate inflammatory process such as cholecystitis/hepatitis. Correlate clinically. No cholelithiasis. Electronically Signed   By: Burman Nieves M.D.   On: 03/13/2023 00:07   DG Chest Port 1 View  Result Date: 03/12/2023 CLINICAL DATA:  Right flank pain radiating to the right abdomen and sternum. EXAM: PORTABLE CHEST 1 VIEW COMPARISON:  November 26, 2014 FINDINGS: The heart size and mediastinal contours are within normal limits. Both lungs are clear. The visualized skeletal structures are unremarkable. IMPRESSION: No acute cardiopulmonary disease.  Electronically Signed   By: Aram Candela M.D.   On: 03/12/2023 22:32      Assessment/Plan Acute cholecystitis  - [place] with cholelithiasis  and edematous gallbladder wall - mild leukocytosis of 11.4, afebrile - ttp on exam and hx consistent with biliary colic  - I have explained the procedure, risks, and aftercare of Laparoscopic cholecystectomy.  Risks include but are not limited to anesthesia (MI, CVA, death, prolonged intubation and aspiration), bleeding, infection, wound problems, hernia, bile leak, injury to common bile duct/liver/intestine, possible need for subtotal cholecystectomy or open cholecystectomy, increased risk of DVT/PE and diarrhea post op. She seems to understand and agrees to proceed.  ABL anemia - s/p recent D&E with heavy bleeding, hgb 7.0 this AM, transfuse 1 unit PRBC  FEN: NPO, IVF VTE: SCDs, hold chemical prophylaxis in setting of anemia  ID: [email or web address]  Admit to OBS. OR today for laparoscopic cholecystectomy after transfusion 1 PRBC.   I reviewed ED provider notes, last 24 h vitals and pain scores, last 48 h intake and output, last 24 h labs and trends, and last 24 h imaging results.    Juliet Rude, [facility] 03/13/2023, 8:07 AM Please see Amion for pager number during day hours 7:00am-4:30pm

## 2023-03-13 NOTE — Discharge Summary (Signed)
[facility] Discharge Summary   Patient ID: Miranda Curtis MRN: [id number] DOB/AGE: 2000/10/06 22 y.o.  Admit date: 03/12/2023 Discharge date: 03/13/2023  Admitting Diagnosis: Acute cholecystitis   Discharge Diagnosis S/p Laparoscopic cholecystectomy   Consultants None   Imaging: US Abdomen Limited RUQ (LIVER/GB)  Result Date: 03/13/2023 CLINICAL DATA:  Right upper quadrant pain. EXAM: ULTRASOUND ABDOMEN LIMITED RIGHT UPPER QUADRANT COMPARISON:  None Available. FINDINGS: Gallbladder: Ill-defined gallstones are seen within the gallbladder lumen. The largest measures approximately 5.3 mm. A thickened, edematous gallbladder wall is noted (6.4 mm). No pericholecystic fluid is seen. No sonographic Murphy sign noted by sonographer. Common bile duct: Diameter: 5.6 mm Liver: No focal lesion identified. Within normal limits in parenchymal echogenicity. Portal vein is patent on color Doppler imaging with normal direction of blood flow towards the liver. Other: None. IMPRESSION: Cholelithiasis and a thickened, edematous gallbladder wall, without additional findings to suggest the presence of acute cholecystitis. Electronically Signed   By: Aram Candela M.D.   On: 03/13/2023 01:11   CT Angio Chest/Abd/Pel for Dissection W and/or Wo Contrast  Result Date: 03/13/2023 CLINICAL DATA:  Acute aortic syndrome suspected. EXAM: CT ANGIOGRAPHY CHEST, ABDOMEN AND PELVIS TECHNIQUE: Non-contrast CT of the chest was initially obtained. Multidetector CT imaging through the chest, abdomen and pelvis was performed using the standard protocol during bolus administration of intravenous contrast. Multiplanar reconstructed images and MIPs were obtained and reviewed to evaluate the vascular anatomy. RADIATION DOSE REDUCTION: This exam was performed according to the departmental dose-optimization program which includes automated exposure control, adjustment of the mA and/or kV according to patient size and/or  use of iterative reconstruction technique. CONTRAST:  OMNIPAQUE IOHEXOL 350 MG/ML SOLN COMPARISON:  Chest radiograph 03/12/2023 FINDINGS: CTA CHEST FINDINGS Cardiovascular: Unenhanced images of the chest demonstrate no significant aortic calcification. No acute intramural hematoma. 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 Images obtained during arterial phase after intravenous contrast material administration demonstrate normal caliber thoracic aorta. Motion artifact limits evaluation but no evidence of aortic dissection. Great vessel origins are patent. Normal heart size. No pericardial effusions. Central pulmonary arteries are well opacified. No evidence of significant pulmonary embolus. Mediastinum/Nodes: Esophagus is decompressed. No significant lymphadenopathy. Thyroid gland is unremarkable. Lungs/Pleura: Lungs are clear. No pleural effusion or pneumothorax. Musculoskeletal: No chest wall abnormality. No acute or significant osseous findings. Review of the MIP images confirms the above findings. CTA ABDOMEN AND PELVIS FINDINGS VASCULAR Aorta: Normal caliber aorta without aneurysm, dissection, vasculitis or significant stenosis. Celiac: Patent without evidence of aneurysm, dissection, vasculitis or significant stenosis. SMA: Patent without evidence of aneurysm, dissection, vasculitis or significant stenosis. Renals: Both renal arteries are patent without evidence of aneurysm, dissection, vasculitis, fibromuscular dysplasia or significant stenosis. IMA: Patent without evidence of aneurysm, dissection, vasculitis or significant stenosis. Inflow: Patent without evidence of aneurysm, dissection, vasculitis or significant stenosis. Veins: No obvious venous abnormality within the limitations of this arterial phase study. Review of the MIP images confirms the above findings. NON-VASCULAR Hepatobiliary: No focal liver lesions. Mild gallbladder wall thickening with pericholecystic edema.  This may indicate acute cholecystitis in the appropriate clinical setting. Mild Peri portal edema may also indicate hepatitis or other inflammatory process. No bile duct dilatation. Pancreas: Unremarkable. No pancreatic ductal dilatation or surrounding inflammatory changes. Spleen: Normal in size without focal abnormality. Adrenals/Urinary Tract: Adrenal glands are unremarkable. Kidneys are normal, without renal calculi, focal lesion, or hydronephrosis. Bladder is unremarkable. Stomach/Bowel: Postoperative stomach consistent with gastric bypass. Appendix appears normal. No evidence  of bowel wall thickening, distention, or inflammatory changes. Lymphatic: No significant lymphadenopathy. Reproductive: Uterus and bilateral adnexa are unremarkable. Other: No abdominal wall hernia or abnormality. No abdominopelvic ascites. Musculoskeletal: No acute or significant osseous findings. Review of the MIP images confirms the above findings. IMPRESSION: 1. No evidence of aneurysm or dissection involving the thoracic or abdominal aorta. Pain 2 no evidence of significant pulmonary embolus. 2. Lungs are clear. 3. Pericholecystic and periportal edema may indicate inflammatory process such as cholecystitis/hepatitis. Correlate clinically. No cholelithiasis. Electronically Signed   By: Burman Nieves M.D.   On: 03/13/2023 00:07   DG Chest Port 1 View  Result Date: 03/12/2023 CLINICAL DATA:  Right flank pain radiating to the right abdomen and sternum. EXAM: PORTABLE CHEST 1 VIEW COMPARISON:  November 26, 2014 FINDINGS: The heart size and mediastinal contours are within normal limits. Both lungs are clear. The visualized skeletal structures are unremarkable. IMPRESSION: No acute cardiopulmonary disease. Electronically Signed   By: Aram Candela M.D.   On: 03/12/2023 22:32    Procedures Dr. Lysle Rubens (03/13/23) - Laparoscopic Cholecystectomy   Hospital Course:  Patient is a 22 year old female who presented to the ED  with abdominal pain.  Workup showed acute cholecystitis.  Patient was admitted and underwent procedure listed above.  Tolerated procedure well and was transferred to the floor.  Diet was advanced as tolerated.  On POD0, the patient was voiding well, tolerating diet, ambulating well, pain well controlled, vital signs stable, incisions c/d/i and felt stable for discharge home.  Patient will follow up in our office in 3-4 weeks and knows to call with questions or concerns. She will call to confirm appointment date/time.    Physical Exam: General:  Alert, NAD, pleasant, comfortable Abd:  Soft, ND, mild tenderness, incisions C/D/I  I or a member of my team have reviewed this patient in the Controlled Substance Database.   Allergies as of 03/13/2023       Reactions   Reglan [metoclopramide] Anxiety, Other (See Comments)   Severe panic attack        Medication List     TAKE these medications    acetaminophen 325 MG tablet Commonly known as: Tylenol Take 2 tablets (650 mg total) by mouth every 4 (four) hours as needed for up to 30 doses for moderate pain.   oxyCODONE 5 MG immediate release tablet Commonly known as: Oxy IR/ROXICODONE Take 1 tablet (5 mg total) by mouth every 6 (six) hours as needed for severe pain (pain score 7-10) or moderate pain (pain score 4-6).               Discharge Care Instructions  (From admission, onward)           Start     Ordered   03/13/23 0000  Discharge wound care:       Comments: Ok to shower tomorrow and allow soap and water to run over incisions   03/13/23 1230              Follow-up Information     Lysle Rubens, MD. Go on 04/09/2023.   Specialty: General Surgery Why: 11:15 AM, please arrive 30 min prior to appointment time to check in. Contact information: [address], [address] [place] [address] [phone]                 Signed: Juliet Rude , [facility] 03/13/2023,  2:18 PM Please see Amion for pager number during  day hours 7:00am-4:30pm

## 2023-03-13 NOTE — Anesthesia Preprocedure Evaluation (Signed)
Anesthesia Evaluation  Patient identified by MRN, date of birth, ID band Patient awake    Reviewed: Allergy & Precautions, H&P , NPO status , Patient's Chart, lab work & pertinent test results  Airway Mallampati: II  TM Distance: >3 FB Neck ROM: Full    Dental no notable dental hx.    Pulmonary neg pulmonary ROS   Pulmonary exam normal breath sounds clear to auscultation       Cardiovascular negative cardio ROS Normal cardiovascular exam Rhythm:Regular Rate:Normal     Neuro/Psych negative neurological ROS  negative psych ROS   GI/Hepatic negative GI ROS, Neg liver ROS,,,  Endo/Other    Class 3 obesity  Renal/GU negative Renal ROS  negative genitourinary   Musculoskeletal negative musculoskeletal ROS (+)    Abdominal   Peds negative pediatric ROS (+)  Hematology  (+) Blood dyscrasia, anemia Hgb 7.0 this morning Will need 1 unit prior to going to OR   Anesthesia Other Findings   Reproductive/Obstetrics negative OB ROS                             Anesthesia Physical Anesthesia Plan  ASA: 3  Anesthesia Plan: General   Post-op Pain Management: Tylenol PO (pre-op)*   Induction: Intravenous  PONV Risk Score and Plan: 3 and Ondansetron, Dexamethasone, Midazolam and Treatment may vary due to age or medical condition  Airway Management Planned: Oral ETT  Additional Equipment:   Intra-op Plan:   Post-operative Plan: Extubation in OR  Informed Consent: I have reviewed the patients History and Physical, chart, labs and discussed the procedure including the risks, benefits and alternatives for the proposed anesthesia with the patient or authorized representative who has indicated his/her understanding and acceptance.     Dental advisory given  Plan Discussed with: CRNA and Surgeon  Anesthesia Plan Comments:        Anesthesia Quick Evaluation

## 2023-03-13 NOTE — Anesthesia Postprocedure Evaluation (Signed)
Anesthesia Post Note  Patient: Miranda Curtis  Procedure(s) Performed: LAPAROSCOPIC CHOLECYSTECTOMY WITH ICG DYE     Patient location during evaluation: PACU Anesthesia Type: General Level of consciousness: awake and alert Pain management: pain level controlled Vital Signs Assessment: post-procedure vital signs reviewed and stable Respiratory status: spontaneous breathing, nonlabored ventilation, respiratory function stable and patient connected to nasal cannula oxygen Cardiovascular status: blood pressure returned to baseline and stable Postop Assessment: no apparent nausea or vomiting Anesthetic complications: no  No notable events documented.  Last Vitals:  Vitals:   03/13/23 1315 03/13/23 1330  BP: 118/80 114/84  Pulse: 84 81  Resp: (!) 26 (!) 25  Temp:    SpO2: 90% 92%    Last Pain:  Vitals:   03/13/23 1330  TempSrc:   PainSc: Asleep                 Harland Aguiniga S

## 2023-03-13 NOTE — Anesthesia Procedure Notes (Signed)
Procedure Name: Intubation Date/Time: 03/13/2023 11:29 AM  Performed by: Chinita Pester, CRNAPre-anesthesia Checklist: Patient identified, Emergency Drugs available, Suction available and Patient being monitored Patient Re-evaluated:Patient Re-evaluated prior to induction Oxygen Delivery Method: Circle System Utilized Preoxygenation: Pre-oxygenation with 100% oxygen Induction Type: IV induction Ventilation: Mask ventilation without difficulty Laryngoscope Size: Mac and 3 Grade View: Grade I Tube type: Oral Tube size: 7.0 mm Number of attempts: 1 Airway Equipment and Method: Stylet and Oral airway Placement Confirmation: ETT inserted through vocal cords under direct vision, positive ETCO2 and breath sounds checked- equal and bilateral Secured at: 21 cm Tube secured with: Tape Dental Injury: Teeth and Oropharynx as per pre-operative assessment

## 2023-03-13 NOTE — Discharge Instructions (Addendum)
Repeat labs ordered for 1-2 weeks post-op, you can go to any [facility] location to have this drawn.     LAPAROSCOPIC SURGERY: POST OP INSTRUCTIONS Always review your discharge instruction sheet given to you by the facility where your surgery was performed. IF YOU HAVE DISABILITY OR FAMILY LEAVE FORMS, YOU MUST BRING THEM TO THE OFFICE FOR PROCESSING.   DO NOT GIVE THEM TO YOUR DOCTOR.  PAIN CONTROL  First take acetaminophen (Tylenol) AND/or ibuprofen (Advil) to control your pain after surgery.  Follow directions on package.  Taking acetaminophen (Tylenol) and/or ibuprofen (Advil) regularly after surgery will help to control your pain and lower the amount of prescription pain medication you may need.  You should not take more than 3,000 mg (3 grams) of acetaminophen (Tylenol) in 24 hours.  You should not take ibuprofen (Advil), aleve, motrin, naprosyn or other NSAIDS if you have a history of stomach ulcers or chronic kidney disease.  A prescription for pain medication may be given to you upon discharge.  Take your pain medication as prescribed, if you still have uncontrolled pain after taking acetaminophen (Tylenol) or ibuprofen (Advil). Use ice packs to help control pain. If you need a refill on your pain medication, please contact your pharmacy.  They will contact our office to request authorization. Prescriptions will not be filled after 5pm or on week-ends.  HOME MEDICATIONS Take your usually prescribed medications unless otherwise directed.  DIET You should follow a light diet the first few days after arrival home.  Be sure to include lots of fluids daily. Avoid fatty, fried foods.   CONSTIPATION It is common to experience some constipation after surgery and if you are taking pain medication.  Increasing fluid intake and taking a stool softener (such as Colace) will usually help or prevent this problem from occurring.  A mild laxative (Milk of Magnesia or Miralax) should be taken according  to package instructions if there are no bowel movements after 48 hours.  WOUND/INCISION CARE Most patients will experience some swelling and bruising in the area of the incisions.  Ice packs will help.  Swelling and bruising can take several days to resolve.  Unless discharge instructions indicate otherwise, follow guidelines below  STERI-STRIPS - you may remove your outer bandages 48 hours after surgery, and you may shower at that time.  You have steri-strips (small skin tapes) in place directly over the incision.  These strips should be left on the skin for 7-10 days.   DERMABOND/SKIN GLUE - you may shower in 24 hours.  The glue will flake off over the next 2-3 weeks. Any sutures or staples will be removed at the office during your follow-up visit.  ACTIVITIES You may resume regular (light) daily activities beginning the next day--such as daily self-care, walking, climbing stairs--gradually increasing activities as tolerated.  You may have sexual intercourse when it is comfortable.  Refrain from any heavy lifting or straining until approved by your doctor. You may drive when you are no longer taking prescription pain medication, you can comfortably wear a seatbelt, and you can safely maneuver your car and apply brakes.  FOLLOW-UP You should see your doctor in the office for a follow-up appointment approximately 2-3 weeks after your surgery.  You should have been given your post-op/follow-up appointment when your surgery was scheduled.  If you did not receive a post-op/follow-up appointment, make sure that you call for this appointment within a day or two after you arrive home to insure a convenient appointment time.  WHEN TO CALL YOUR DOCTOR: Fever over 101.0 Inability to urinate Continued bleeding from incision. Increased pain, redness, or drainage from the incision. Increasing abdominal pain  The clinic staff is available to answer your questions during regular business hours.  Please  don't hesitate to call and ask to speak to one of the nurses for clinical concerns.  If you have a medical emergency, go to the nearest emergency room or call 911.  A surgeon from [facility] is always on call at the hospital. [address], [address], [place], [place]  [address] ? [address] [address], [place], [place]   [phone] ? [phone] ? FAX [phone] Web site: [email or web address]

## 2023-03-13 NOTE — Transfer of Care (Signed)
Immediate Anesthesia Transfer of Care Note  Patient: Miranda Curtis  Procedure(s) Performed: LAPAROSCOPIC CHOLECYSTECTOMY WITH ICG DYE  Patient Location: PACU  Anesthesia Type:General  Level of Consciousness: awake, drowsy, patient cooperative, and responds to stimulation  Airway & Oxygen Therapy: Patient Spontanous Breathing and Patient connected to face mask oxygen  Post-op Assessment: Report given to RN and Post -op Vital signs reviewed and stable  Post vital signs: Reviewed and stable  Last Vitals:  Vitals Value Taken Time  BP 126/76 03/13/23 1239  Temp    Pulse 91 03/13/23 1240  Resp 23 03/13/23 1240  SpO2 100 % 03/13/23 1240  Vitals shown include unfiled device data.  Last Pain:  Vitals:   03/13/23 1059  TempSrc: Oral  PainSc:       Patients Stated Pain Goal: 5 (03/13/23 0925)  Complications: No notable events documented.

## 2023-03-13 NOTE — Op Note (Signed)
03/13/2023 1:12 PM  PATIENT: Miranda Curtis  22 y.o. female  Patient Care Team: Pcp, No as PCP - General  PRE-OPERATIVE DIAGNOSIS: cholelithiasis with acute cholecystitis  POST-OPERATIVE DIAGNOSIS: choelithiasis with acute cholecystitis  PROCEDURE: laparoscopic cholecystectomy  SURGEON: Donata Duff, MD  ASSISTANT: Trixie Deis, PA-C  ANESTHESIA: General endotracheal  EBL: <5cc  DRAINS: None  SPECIMEN: Gallbladder  COUNTS: Sponge, needle and instrument counts were reported correct x2 at the conclusion of the operation  DISPOSITION: PACU in satisfactory condition  COMPLICATIONS: None  FINDINGS: Minimally inflamed gallbladder with omental adhesions and cholelithiasis.  DESCRIPTION:  The patient was identified & brought into the operating room. She was then positioned supine on the OR table. SCDs were in place and active during the entire case. She then underwent general endotracheal anesthesia. Pressure points were padded. The abdomen was prepped and draped in the standard sterile fashion. Antibiotics were administered. A surgical timeout was performed and confirmed our plan.   A periumbilical incision was made. The umbilical stalk was grasped and retracted outwardly. A veress needle was inserted and the abdomen was insufflated to . The veress was then removed and exchanged for a 5mm trocar optiview using a 30 degree scope and the abdomen was entered under direct visualization. Inspection confirmed no evidence of trocar site complications. The patient was then positioned in reverse Trendelenburg with slight left side down. A 12 mm supxiphoid trocar was placed under direct visualization and  two additional 5mm trocars were placed along the right subcostal line - one 5mm port in mid subcostal region, another 5mm port in the right flank near the anterior axillary line.  The liver and gallbladder were inspected. Minimal inflammation noted, gallbladder overall relatively normal  appearing except for omental adhesions. The gallbladder fundus was grasped and elevated cephalad. An additional grasper was then placed on the infundibulum of the gallbladder and the infundibulum was retracted laterally. Omental adhesions were gently dissected away from gallbladder using blunt dissection and electrocautery. Staying high on the gallbladder, the peritoneum on both sides of the gallbladder was opened with hook cautery. Gentle blunt dissection was then employed with a Art gallery manager working down into [employer]. The cystic duct was identified and carefully circumferentially dissected. The cystic artery was also identified and carefully circumferentially dissected. The space between the cystic artery and hepatocystic plate was developed such that a good view of the liver could be seen through a window medial to the cystic artery. The triangle of Calot had been cleared of all fibrofatty tissue. At this point, a critical view of safety was achieved and the only structures visualized was the skeletonized cystic duct laterally, the skeletonized cystic artery and the liver through the window medial to the artery. No posterior cystic artery was noted. ICG had been given pre-op and was utilized to confirm biliary anatomy. CBD and duodenum both lit green before and after clipping cystic duct and artery.  The cystic duct and artery were clipped with 2 hemolock clips on the patient side and 1 clip on the specimen side. The cystic duct and artery were then divided. The gallbladder was then freed from its remaining attachments to the liver using electrocautery and placed into an endocatch bag. The RUQ was gently irrigated with sterile saline. Hemostasis was then verified. The clips were in good position; the gallbladder fossa was dry. The rest of the abdomen was inspected no injury nor bleeding elsewhere was identified.  The endocatch bag containing the gallbladder was then removed from the subxiphoid  port site and passed off as specimen. The subxiphoid port fascia was then closed in a figured of eight fashion using a suture passer. The RUQ ports were removed under direct visualization and noted to be hemostatic.Marland Kitchen The fascia was palpated and noted to be completely closed. The skin of all incision sites was approximated with 4-0 monocryl subcuticular suture and dermabond applied. The patient was then awakened from anesthesia, extubated, and transferred to a stretcher for transport to PACU in satisfactory condition.   Donata Duff, MD [facility]

## 2023-03-14 ENCOUNTER — Encounter (HOSPITAL_COMMUNITY): Payer: Self-pay | Admitting: General Surgery

## 2023-03-14 LAB — SURGICAL PATHOLOGY

## 2023-03-17 LAB — BPAM RBC
Blood Product Expiration Date: 202412212359
Blood Product Expiration Date: 202412272359
ISSUE DATE / TIME: 202412041009
ISSUE DATE / TIME: 202412041009
Unit Type and Rh: 6200
Unit Type and Rh: 6200

## 2023-03-17 LAB — TYPE AND SCREEN
ABO/RH(D): AB POS
Antibody Screen: NEGATIVE
Unit division: 0
Unit division: 0

## 2023-03-18 LAB — CULTURE, BLOOD (ROUTINE X 2)
Culture: NO GROWTH
Culture: NO GROWTH

## 2023-03-20 ENCOUNTER — Ambulatory Visit (INDEPENDENT_AMBULATORY_CARE_PROVIDER_SITE_OTHER): Payer: MEDICAID | Admitting: Family Medicine

## 2023-03-20 ENCOUNTER — Encounter: Payer: Self-pay | Admitting: Family Medicine

## 2023-03-20 VITALS — BP 105/72 | HR 97 | Wt 192.8 lb

## 2023-03-20 DIAGNOSIS — Z9889 Other specified postprocedural states: Secondary | ICD-10-CM

## 2023-03-20 DIAGNOSIS — D62 Acute posthemorrhagic anemia: Secondary | ICD-10-CM

## 2023-03-20 DIAGNOSIS — O0330 Unspecified complication following incomplete spontaneous abortion: Secondary | ICD-10-CM

## 2023-03-20 NOTE — Assessment & Plan Note (Signed)
UPT is positive still, faintly--check HCG and trend down.

## 2023-03-20 NOTE — Progress Notes (Signed)
Patient here to follow up D&E on 02/24/23  Denies any bleeding  ,no pain recent ED visit pt states she was having abdominal pain when she went I notes have gall bladde removed.  Pt was told she was sepsis from D&E.  HCG on 03/12/23: Positive pt is concerned.

## 2023-03-20 NOTE — Progress Notes (Signed)
    Subjective:    Patient ID: Miranda Curtis is a 22 y.o. female presenting with Follow-up  on 03/20/2023  HPI: Pt. Is s/p D and C for incomplete AB following medical TOP. She was supposed to be 7 weeks. 2 weeks later had some cholecystitis and underwent emergent lap chole. Her serum HCG was still positive. She has had unprotected intercourse. She is not having bleeding.  Review of Systems  Constitutional:  Negative for chills and fever.  Respiratory:  Negative for shortness of breath.   Cardiovascular:  Negative for chest pain.  Gastrointestinal:  Negative for abdominal pain, nausea and vomiting.  Genitourinary:  Negative for dysuria.  Skin:  Negative for rash.      Objective:    BP 105/72   Pulse 97   Wt 192 lb 12.8 oz (87.5 kg)   BMI 36.43 kg/m  Physical Exam Exam conducted with a chaperone present.  Constitutional:      General: She is not in acute distress.    Appearance: She is well-developed.  HENT:     Head: Normocephalic and atraumatic.  Eyes:     General: No scleral icterus. Cardiovascular:     Rate and Rhythm: Normal rate.  Pulmonary:     Effort: Pulmonary effort is normal.  Abdominal:     Palpations: Abdomen is soft.  Musculoskeletal:     Cervical back: Neck supple.  Skin:    General: Skin is warm and dry.  Neurological:     Mental Status: She is alert and oriented to person, place, and time.         Assessment & Plan:   Problem List Items Addressed This Visit       Unprioritized   Incomplete abortion with complication    UPT is positive still, faintly--check HCG and trend down.      Acute blood loss anemia    Check hgb to day to ensure it is going up      Relevant Orders   CBC   Other Visit Diagnoses     Status post dilatation and curettage    -  Primary   for incomplete. Doing well.   Relevant Orders   Beta hCG quant (ref lab)        Return if symptoms worsen or fail to improve.  Reva Bores, MD 03/20/2023 2:37  PM

## 2023-03-20 NOTE — Assessment & Plan Note (Signed)
Check hgb to day to ensure it is going up

## 2023-03-21 LAB — BETA HCG QUANT (REF LAB): hCG Quant: 19 m[IU]/mL

## 2023-03-21 LAB — CBC
Hematocrit: 31.7 % — ABNORMAL LOW (ref 34.0–46.6)
Hemoglobin: 9.9 g/dL — ABNORMAL LOW (ref 11.1–15.9)
MCH: 23.1 pg — ABNORMAL LOW (ref 26.6–33.0)
MCHC: 31.2 g/dL — ABNORMAL LOW (ref 31.5–35.7)
MCV: 74 fL — ABNORMAL LOW (ref 79–97)
Platelets: 321 10*3/uL (ref 150–450)
RBC: 4.29 x10E6/uL (ref 3.77–5.28)
RDW: 17.6 % — ABNORMAL HIGH (ref 11.7–15.4)
WBC: 7.9 10*3/uL (ref 3.4–10.8)

## 2023-03-26 ENCOUNTER — Other Ambulatory Visit: Payer: MEDICAID

## 2023-03-26 DIAGNOSIS — Z9889 Other specified postprocedural states: Secondary | ICD-10-CM

## 2023-03-27 LAB — BETA HCG QUANT (REF LAB): hCG Quant: 8 m[IU]/mL

## 2023-03-28 ENCOUNTER — Other Ambulatory Visit: Payer: MEDICAID

## 2023-04-09 ENCOUNTER — Other Ambulatory Visit: Payer: Self-pay | Admitting: General Surgery

## 2023-04-09 DIAGNOSIS — Z9049 Acquired absence of other specified parts of digestive tract: Secondary | ICD-10-CM

## 2023-04-10 NOTE — L&D Delivery Note (Signed)
 Labor Progress Miranda Curtis is a 23 y.o. female [phone] with IUP at [redacted]w[redacted]d admitted for IOL for vasovagal syncope in pregnancy .  She progressed with augmentation to complete and pushed with one contraction to deliver.  Cord clamping at around 1 minute by CNM and cut by CNM also due to infant slow transition and need for resuscitation.  Infant taken to warmer and stimulation, including deep suction, provided by RN staff.  Infant transitioned well and was returned to patient within a few minutes.  Placenta intact and spontaneous, bleeding minimal.  Intact perineum, no repair. Mom and baby stable prior to transfer to postpartum. She plans on bottlefeeding. She requests OCPs for birth control.    Delivery Note At 9:28 PM a viable and healthy female was delivered via Vaginal, Spontaneous (Presentation: Right Occiput Anterior).  APGAR: 6, 9; weight 6 lb 13.7 oz (3110 g).   Placenta status: Spontaneous, Intact.  Cord: 3 vessels with the following complications: None.    Anesthesia: Epidural Episiotomy: None Lacerations: None Suture Repair: n/a Est. Blood Loss (mL): 215  Mom to postpartum.  Baby to Couplet care / Skin to Skin.  Olam Boards 02/05/2024, 12:46 AM

## 2023-04-26 ENCOUNTER — Ambulatory Visit
Admission: RE | Admit: 2023-04-26 | Discharge: 2023-04-26 | Disposition: A | Discharge status: Home / Self Care | Payer: MEDICAID | Source: Ambulatory Visit | Attending: General Surgery | Admitting: General Surgery

## 2023-04-26 DIAGNOSIS — Z9049 Acquired absence of other specified parts of digestive tract: Secondary | ICD-10-CM

## 2023-05-02 ENCOUNTER — Encounter: Payer: Self-pay | Admitting: Obstetrics & Gynecology

## 2023-05-03 MED ORDER — FLUCONAZOLE 150 MG PO TABS: 150.0000 mg | ORAL_TABLET | Freq: Once | ORAL | 3 refills | Status: AC

## 2023-05-03 MED ORDER — FLUCONAZOLE 150 MG PO TABS: 150.0000 mg | ORAL_TABLET | Freq: Once | ORAL | 3 refills | Status: DC

## 2023-06-24 ENCOUNTER — Other Ambulatory Visit: Payer: Self-pay

## 2023-06-24 ENCOUNTER — Encounter (HOSPITAL_COMMUNITY): Payer: Self-pay | Admitting: Obstetrics and Gynecology

## 2023-06-24 ENCOUNTER — Inpatient Hospital Stay (HOSPITAL_COMMUNITY)
Admission: AD | Admit: 2023-06-24 | Discharge: 2023-06-24 | Disposition: A | Discharge status: Home / Self Care | Payer: MEDICAID | Attending: Obstetrics and Gynecology | Admitting: Obstetrics and Gynecology

## 2023-06-24 ENCOUNTER — Inpatient Hospital Stay (HOSPITAL_COMMUNITY): Payer: MEDICAID

## 2023-06-24 DIAGNOSIS — Z3A01 Less than 8 weeks gestation of pregnancy: Secondary | ICD-10-CM | POA: Insufficient documentation

## 2023-06-24 DIAGNOSIS — O208 Other hemorrhage in early pregnancy: Secondary | ICD-10-CM | POA: Diagnosis present

## 2023-06-24 DIAGNOSIS — O209 Hemorrhage in early pregnancy, unspecified: Secondary | ICD-10-CM

## 2023-06-24 DIAGNOSIS — W109XXA Fall (on) (from) unspecified stairs and steps, initial encounter: Secondary | ICD-10-CM | POA: Diagnosis not present

## 2023-06-24 DIAGNOSIS — O468X1 Other antepartum hemorrhage, first trimester: Secondary | ICD-10-CM | POA: Diagnosis not present

## 2023-06-24 DIAGNOSIS — N939 Abnormal uterine and vaginal bleeding, unspecified: Secondary | ICD-10-CM | POA: Diagnosis not present

## 2023-06-24 DIAGNOSIS — O418X10 Other specified disorders of amniotic fluid and membranes, first trimester, not applicable or unspecified: Secondary | ICD-10-CM

## 2023-06-24 LAB — CBC
HCT: 32.3 % — ABNORMAL LOW (ref 36.0–46.0)
Hemoglobin: 10.1 g/dL — ABNORMAL LOW (ref 12.0–15.0)
MCH: 19.8 pg — ABNORMAL LOW (ref 26.0–34.0)
MCHC: 31.3 g/dL (ref 30.0–36.0)
MCV: 63.5 fL — ABNORMAL LOW (ref 80.0–100.0)
Platelets: 433 10*3/uL — ABNORMAL HIGH (ref 150–400)
RBC: 5.09 MIL/uL (ref 3.87–5.11)
RDW: 20.2 % — ABNORMAL HIGH (ref 11.5–15.5)
WBC: 7.9 10*3/uL (ref 4.0–10.5)
nRBC: 0 % (ref 0.0–0.2)

## 2023-06-24 LAB — POCT PREGNANCY, URINE: Preg Test, Ur: POSITIVE — AB

## 2023-06-24 LAB — ABO/RH: ABO/RH(D): AB POS

## 2023-06-24 LAB — WET PREP, GENITAL
Clue Cells Wet Prep HPF POC: NONE SEEN
Sperm: NONE SEEN
Trich, Wet Prep: NONE SEEN
WBC, Wet Prep HPF POC: <10 (ref <10)
Yeast Wet Prep HPF POC: NONE SEEN

## 2023-06-24 LAB — HCG, QUANTITATIVE, PREGNANCY: hCG, Beta Chain, Quant, S: 12180 m[IU]/mL — ABNORMAL HIGH (ref <5)

## 2023-06-24 LAB — HIV ANTIBODY (ROUTINE TESTING W REFLEX): HIV Screen 4th Generation wRfx: NONREACTIVE

## 2023-06-24 NOTE — MAU Note (Signed)
 Miranda Curtis is a 23 y.o. at Unknown here in MAU reporting: she fell down the stairs last night and has had spotting and cramping since fall.  Reports +HPT 2-3 weeks ago, unsure of LMP.  LMP: end of January Onset of complaint: last night Pain score: 5 Vitals:   06/24/23 1009  BP: 106/64  Pulse: 89  Resp: 18  Temp: 98.5 F (36.9 C)  SpO2: 100%     FHT: NA  Lab orders placed from triage: UPT

## 2023-06-24 NOTE — MAU Provider Note (Signed)
 History     CSN: [id number]  Arrival date and time: 06/24/23 0941   Event Date/Time   First Provider Initiated Contact with Patient 06/24/23 1248      Chief Complaint  Patient presents with   Abdominal Pain   Spotting   Miranda Curtis , a  23 y.o. [id number] at Unknown presents to MAU with complaints of vaginal spotting following a fall down the stairs yesterday.  Patient reports she just missed a step and fell.  She denies blunt abdominal trauma.  She reports lower abdominal cramping that started last night after the fall that she attempted to relieve with p.o. Tylenol.  Upon awaking this morning she reports that Tylenol did not help "some ".  This morning she also noted light pink vaginal spotting and presented to MAU.  She denies bright red heavy vaginal bleeding and passing clots and abnormal vaginal discharge prior to this.  She also denies urinary symptoms.  Reports last menstrual period was sometime at the end of January and this is a desired pregnancy.         OB History     Gravida  5   Para  2   Term  2   Preterm      AB  2   Living  2      SAB  2   IAB      Ectopic      Multiple  0   Live Births  2           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-has resolved following surgery   Eczema    History of depression 11/18/2017   History of diabetes mellitus 04/11/2022   Maternal varicella, non-immune 05/09/2017   Missed abortion 04/07/2019    Past Surgical History:  Procedure Laterality Date   CHOLECYSTECTOMY N/A 03/13/2023   Procedure: LAPAROSCOPIC CHOLECYSTECTOMY WITH ICG DYE;  Surgeon: Lysle Rubens, MD;  Location: [facility];  Service: General;  Laterality: N/A;   DILATION AND EVACUATION N/A 02/24/2023   Procedure: DILATATION AND EVACUATION;  Surgeon:  Reva Bores, MD;  Location: [facility] OR;  Service: Gynecology;  Laterality: N/A;   SLEEVE GASTROPLASTY  10/12/2021   UPPER GASTROINTESTINAL ENDOSCOPY N/A     Family History  Problem Relation Age of Onset   Diabetes Mother    Healthy Mother    Sickle cell trait Mother    Sickle cell trait Father    Rashes / Skin problems Father        eczema   Asthma Sister    Rashes / Skin problems Sister        eczema   Rashes / Skin problems Sister        eczema   Rashes / Skin problems Sister        eczema   Sickle cell anemia Sister    Rashes / Skin problems Sister        eczema   Autism Brother    Healthy Brother    Diabetes Maternal Grandmother    Hypertension Maternal Grandmother    Cancer Maternal Grandfather        prostate   Cancer Paternal Grandmother 47       lung   Healthy Paternal Grandfather    Ovarian cancer Neg Hx  Colon cancer Neg Hx    Breast cancer Neg Hx     Social History   Tobacco Use   Smoking status: Never   Smokeless tobacco: Never  Vaping Use   Vaping status: Never Used  Substance Use Topics   Alcohol use: No   Drug use: No    Allergies:  Allergies  Allergen Reactions   Reglan [Metoclopramide] Anxiety and Other (See Comments)    Severe panic attack    Medications Prior to Admission  Medication Sig Dispense Refill Last Dose/Taking   acetaminophen (TYLENOL) 325 MG tablet Take 2 tablets (650 mg total) by mouth every 4 (four) hours as needed for up to 30 doses for moderate pain. 60 tablet 0    oxyCODONE (OXY IR/ROXICODONE) 5 MG immediate release tablet Take 1 tablet (5 mg total) by mouth every 6 (six) hours as needed for severe pain (pain score 7-10) or moderate pain (pain score 4-6). 15 tablet 0     Review of Systems  Constitutional:  Negative for chills, fatigue and fever.  Eyes:  Negative for pain and visual disturbance.  Respiratory:  Negative for apnea, shortness of breath and wheezing.   Cardiovascular:  Negative for chest pain and  palpitations.  Gastrointestinal:  Positive for abdominal pain. Negative for constipation, diarrhea, nausea and vomiting.  Genitourinary:  Positive for vaginal bleeding. Negative for difficulty urinating, dysuria, pelvic pain, vaginal discharge and vaginal pain.  Musculoskeletal:  Negative for back pain.  Neurological:  Negative for seizures, weakness and headaches.  Psychiatric/Behavioral:  Negative for suicidal ideas.    Physical Exam   Blood pressure 106/64, pulse 89, temperature 98.5 F (36.9 C), temperature source Oral, resp. rate 18, height 5\' 1"  (1.549 m), weight 89.6 kg, SpO2 100%, not currently breastfeeding.  Physical Exam Vitals and nursing note reviewed.  Constitutional:      General: She is not in acute distress.    Appearance: Normal appearance.  HENT:     Head: Normocephalic.  Pulmonary:     Effort: Pulmonary effort is normal.  Musculoskeletal:     Cervical back: Normal range of motion.  Skin:    General: Skin is warm and dry.  Neurological:     Mental Status: She is alert and oriented to person, place, and time.  Psychiatric:        Mood and Affect: Mood normal.     MAU Course  Procedures Orders Placed This Encounter  Procedures   Wet prep, genital   US OB LESS THAN 14 WEEKS WITH OB TRANSVAGINAL   CBC   hCG, quantitative, pregnancy   HIV Antibody (routine testing w rflx)   Diet NPO time specified   Pregnancy, urine POC   ABO/Rh   Discharge patient Discharge disposition: 01-Home or Self Care; Discharge patient date: 06/24/2023   Results for orders placed or performed during the hospital encounter of 06/24/23 (from the past 24 hours)  Pregnancy, urine POC     Status: Abnormal   Collection Time: 06/24/23 10:15 AM  Result Value Ref Range   Preg Test, Ur POSITIVE (A) NEGATIVE  CBC     Status: Abnormal   Collection Time: 06/24/23 11:20 AM  Result Value Ref Range   WBC 7.9 4.0 - 10.5 K/uL   RBC 5.09 3.87 - 5.11 MIL/uL   Hemoglobin 10.1 (L) 12.0 - 15.0  g/dL   HCT 04.5 (L) 40.9 - 81.1 %   MCV 63.5 (L) 80.0 - 100.0 fL   MCH 19.8 (L) 26.0 - 34.0  pg   MCHC 31.3 30.0 - 36.0 g/dL   RDW 86.5 (H) 78.4 - 69.6 %   Platelets 433 (H) 150 - 400 K/uL   nRBC 0.0 0.0 - 0.2 %  ABO/Rh     Status: None   Collection Time: 06/24/23 11:20 AM  Result Value Ref Range   ABO/RH(D)      AB POS Performed at [facility], [address], [place], [place] [address]   hCG, quantitative, pregnancy     Status: Abnormal   Collection Time: 06/24/23 11:20 AM  Result Value Ref Range   hCG, Beta Chain, Quant, S 12,180 (H) <5 mIU/mL  HIV Antibody (routine testing w rflx)     Status: None   Collection Time: 06/24/23 11:20 AM  Result Value Ref Range   HIV Screen 4th Generation wRfx Non Reactive Non Reactive  Wet prep, genital     Status: None   Collection Time: 06/24/23 11:25 AM   Specimen: PATH Cytology Cervicovaginal Ancillary Only  Result Value Ref Range   Yeast Wet Prep HPF POC NONE SEEN NONE SEEN   Trich, Wet Prep NONE SEEN NONE SEEN   Clue Cells Wet Prep HPF POC NONE SEEN NONE SEEN   WBC, Wet Prep HPF POC <10 <10   Sperm NONE SEEN     MDM -Wet prep normal -hCG greater than 12,000. - AB pos blood type.  - Hgb midly low, but overall patient hemodynamically stable.  - [place] imiaging reviewed by me, and noted a single living IUP.  - Also of note a subchorionic hemorrage noted.  - plan for discharge.   Assessment and Plan   1. Subchorionic hematoma in first trimester, single or unspecified fetus   2. Vaginal bleeding   3. Bleeding in early pregnancy   4. Less than [redacted] weeks gestation of pregnancy    - Reviewed results and bleeding expectations with [facility]. Recommended pelvic rest at least for 2 weeks since start of bleed.  - Reviewed worsening signs and return precautions.  - Patient plans to establish care at [facility]. Encouraged to call and make an appt for 10-[redacted] weeks gestation.  - Patient discharged home in stable condition and may return to MAU as  needed.   Claudette Head, MSN CNM  06/24/2023, 12:48 PM

## 2023-06-25 LAB — GC/CHLAMYDIA PROBE AMP (~~LOC~~) NOT AT ARMC
Chlamydia: NEGATIVE
Comment: NEGATIVE
Comment: NORMAL
Neisseria Gonorrhea: NEGATIVE

## 2023-07-22 ENCOUNTER — Encounter (HOSPITAL_COMMUNITY): Payer: Self-pay | Admitting: Obstetrics & Gynecology

## 2023-07-22 ENCOUNTER — Inpatient Hospital Stay (HOSPITAL_COMMUNITY)
Admission: AD | Admit: 2023-07-22 | Discharge: 2023-07-23 | Disposition: A | Discharge status: Home / Self Care | Payer: MEDICAID | Attending: Obstetrics & Gynecology | Admitting: Obstetrics & Gynecology

## 2023-07-22 DIAGNOSIS — O210 Mild hyperemesis gravidarum: Secondary | ICD-10-CM | POA: Diagnosis present

## 2023-07-22 DIAGNOSIS — Z3A09 9 weeks gestation of pregnancy: Secondary | ICD-10-CM | POA: Insufficient documentation

## 2023-07-22 LAB — WET PREP, GENITAL
Clue Cells Wet Prep HPF POC: NONE SEEN
Sperm: NONE SEEN
Trich, Wet Prep: NONE SEEN
WBC, Wet Prep HPF POC: <10 (ref <10)

## 2023-07-22 LAB — URINALYSIS, ROUTINE W REFLEX MICROSCOPIC
Bilirubin Urine: NEGATIVE
Glucose, UA: NEGATIVE mg/dL
Hgb urine dipstick: NEGATIVE
Ketones, ur: 5 mg/dL — AB
Nitrite: NEGATIVE
Protein, ur: NEGATIVE mg/dL
Specific Gravity, Urine: 1.029 (ref 1.005–1.030)
pH: 6 (ref 5.0–8.0)

## 2023-07-22 LAB — CBC
HCT: 32.2 % — ABNORMAL LOW (ref 36.0–46.0)
Hemoglobin: 10.2 g/dL — ABNORMAL LOW (ref 12.0–15.0)
MCH: 20 pg — ABNORMAL LOW (ref 26.0–34.0)
MCHC: 31.7 g/dL (ref 30.0–36.0)
MCV: 63 fL — ABNORMAL LOW (ref 80.0–100.0)
Platelets: 377 10*3/uL (ref 150–400)
RBC: 5.11 MIL/uL (ref 3.87–5.11)
RDW: 19.7 % — ABNORMAL HIGH (ref 11.5–15.5)
WBC: 10.3 10*3/uL (ref 4.0–10.5)
nRBC: 0 % (ref 0.0–0.2)

## 2023-07-22 MED ORDER — SODIUM CHLORIDE 0.9 % IV SOLN
25.0000 mg | Freq: Once | INTRAVENOUS | Status: AC
  Administered 2023-07-22: 25 mg via INTRAVENOUS
  Filled 2023-07-22: qty 1

## 2023-07-22 MED ORDER — ONDANSETRON 4 MG PO TBDP
8.0000 mg | ORAL_TABLET | Freq: Once | ORAL | Status: AC
Start: 2023-07-22 — End: 2023-07-22
  Administered 2023-07-22: 8 mg via ORAL
  Filled 2023-07-22: qty 2

## 2023-07-22 NOTE — MAU Note (Signed)
 Pt says dizzy- when she stands - feels drunk. Feels fatigue- can't work. Can't feed kids.  Chest hurts some now. Feels out of breath.   O2 sat in Triage- 100 %. Vomited x6  today .

## 2023-07-22 NOTE — MAU Provider Note (Signed)
 History     CSN: [id number]  Arrival date and time: 07/22/23 1958   Event Date/Time   First Provider Initiated Contact with Patient 07/22/23 2328      Chief Complaint  Patient presents with   Emesis   HPI [name] is a 23 y.o. year old [id number] female at [redacted]w[redacted]d weeks gestation who presents to MAU reporting feeling dizzy when she stands up; "feels drunk."  She states she feels fatigued and cannot work and cannot feed her kids.  She reports her chest hurts now.  She reports feeling out of breath.  She reports she has vomited 6 times today. She plans to receive [facility] with [facility]; first appt is 07/29/2023.   OB History     Gravida  5   Para  2   Term  2   Preterm      AB  2   Living  2      SAB  2   IAB      Ectopic      Multiple  0   Live Births  2           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-has resolved following surgery   Eczema    History of depression 11/18/2017   History of diabetes mellitus 04/11/2022   Maternal varicella, non-immune 05/09/2017   Missed abortion 04/07/2019    Past Surgical History:  Procedure Laterality Date   CHOLECYSTECTOMY N/A 03/13/2023   Procedure: LAPAROSCOPIC CHOLECYSTECTOMY WITH ICG DYE;  Surgeon: Edmon Gosling, MD;  Location: [facility];  Service: General;  Laterality: N/A;   DILATION AND EVACUATION N/A 02/24/2023   Procedure: DILATATION AND EVACUATION;  Surgeon: Granville Layer, MD;  Location: [facility] OR;  Service: Gynecology;  Laterality: N/A;   SLEEVE GASTROPLASTY  10/12/2021   UPPER GASTROINTESTINAL ENDOSCOPY N/A     Family History  Problem Relation Age of Onset   Diabetes Mother    Healthy Mother    Sickle cell trait Mother    Sickle cell trait Father    Rashes / Skin problems Father        eczema   Asthma Sister     Rashes / Skin problems Sister        eczema   Rashes / Skin problems Sister        eczema   Rashes / Skin problems Sister        eczema   Sickle cell anemia Sister    Rashes / Skin problems Sister        eczema   Autism Brother    Healthy Brother    Diabetes Maternal Grandmother    Hypertension Maternal Grandmother    Cancer Maternal Grandfather        prostate   Cancer Paternal Grandmother 51       lung   Healthy Paternal Grandfather    Ovarian cancer Neg Hx    Colon cancer Neg Hx    Breast cancer Neg Hx     Social History   Tobacco Use   Smoking status: Never   Smokeless tobacco: Never  Vaping Use   Vaping status: Never Used  Substance Use Topics   Alcohol use: No   Drug use: No    Allergies:  Allergies  Allergen  Reactions   Reglan [Metoclopramide] Anxiety and Other (See Comments)    Severe panic attack    Medications Prior to Admission  Medication Sig Dispense Refill Last Dose/Taking   acetaminophen (TYLENOL) 325 MG tablet Take 2 tablets (650 mg total) by mouth every 4 (four) hours as needed for up to 30 doses for moderate pain. 60 tablet 0    oxyCODONE (OXY IR/ROXICODONE) 5 MG immediate release tablet Take 1 tablet (5 mg total) by mouth every 6 (six) hours as needed for severe pain (pain score 7-10) or moderate pain (pain score 4-6). 15 tablet 0     Review of Systems  Constitutional:  Positive for appetite change and fatigue (unable to work or [employer]).  HENT: Negative.    Eyes: Negative.   Respiratory: Negative.    Cardiovascular: Negative.   Gastrointestinal:  Positive for nausea and vomiting (6x today).  Endocrine: Negative.   Genitourinary:  Positive for pelvic pain (cramping).  Musculoskeletal: Negative.   Skin: Negative.   Allergic/Immunologic: Negative.   Neurological:  Positive for dizziness and weakness.  Hematological: Negative.   Psychiatric/Behavioral: Negative.     Physical Exam   Blood pressure 113/70, pulse 97, temperature 98.5  F (36.9 C), temperature source Oral, resp. rate 12, height 5\' 1"  (1.549 m), weight 89.4 kg, not currently breastfeeding.  Physical Exam Reassessment @ 0014: Patient reports "I usually have cramping, but since being here I feel more cramping." Advised dehydration can cause cramping in pregnancy and the IVFs have just been started. We will re-evaluate the cramping after more IVFs have infused.  MAU Course  Procedures  MDM CCUA  UCx -- Results pending  CBC CMP Zofran 8 mg ODT  -- patient vomited medication   IVFs: Phenergan 25 mg in LR 1000 ml @ 999 ml/hr  -- resolved nausea/vomiting Scopolamine Patch x 1 Pepcid 20 mg IVPB PO Challenge -- patient tolerated well   Results for orders placed or performed during the hospital encounter of 07/22/23 (from the past 24 hours)  Urinalysis, Routine w reflex microscopic -Urine, Clean Catch     Status: Abnormal   Collection Time: 07/22/23  8:37 PM  Result Value Ref Range   Color, Urine YELLOW YELLOW   APPearance HAZY (A) CLEAR   Specific Gravity, Urine 1.029 1.005 - 1.030   pH 6.0 5.0 - 8.0   Glucose, UA NEGATIVE NEGATIVE mg/dL   Hgb urine dipstick NEGATIVE NEGATIVE   Bilirubin Urine NEGATIVE NEGATIVE   Ketones, ur 5 (A) NEGATIVE mg/dL   Protein, ur NEGATIVE NEGATIVE mg/dL   Nitrite NEGATIVE NEGATIVE   Leukocytes,Ua SMALL (A) NEGATIVE   RBC / HPF 0-5 0 - 5 RBC/hpf   WBC, UA 11-20 0 - 5 WBC/hpf   Bacteria, UA RARE (A) NONE SEEN   Squamous Epithelial / HPF 6-10 0 - 5 /HPF   Mucus PRESENT   Wet prep, genital     Status: Abnormal   Collection Time: 07/22/23  8:40 PM  Result Value Ref Range   Yeast Wet Prep HPF POC PRESENT (A) NONE SEEN   Trich, Wet Prep NONE SEEN NONE SEEN   Clue Cells Wet Prep HPF POC NONE SEEN NONE SEEN   WBC, Wet Prep HPF POC <10 <10   Sperm NONE SEEN   CBC     Status: Abnormal   Collection Time: 07/22/23 11:34 PM  Result Value Ref Range   WBC 10.3 4.0 - 10.5 K/uL   RBC 5.11 3.87 - 5.11 MIL/uL   Hemoglobin  10.2  (L) 12.0 - 15.0 g/dL   HCT 16.1 (L) 09.6 - 04.5 %   MCV 63.0 (L) 80.0 - 100.0 fL   MCH 20.0 (L) 26.0 - 34.0 pg   MCHC 31.7 30.0 - 36.0 g/dL   RDW 40.9 (H) 81.1 - 91.4 %   Platelets 377 150 - 400 K/uL   nRBC 0.0 0.0 - 0.2 %  Comprehensive metabolic panel     Status: Abnormal   Collection Time: 07/22/23 11:34 PM  Result Value Ref Range   Sodium 132 (L) 135 - 145 mmol/L   Potassium 3.7 3.5 - 5.1 mmol/L   Chloride 102 98 - 111 mmol/L   CO2 22 22 - 32 mmol/L   Glucose, Bld 82 70 - 99 mg/dL   BUN 8 6 - 20 mg/dL   Creatinine, Ser 7.82 0.44 - 1.00 mg/dL   Calcium 9.2 8.9 - 95.6 mg/dL   Total Protein 7.9 6.5 - 8.1 g/dL   Albumin 4.0 3.5 - 5.0 g/dL   AST 15 15 - 41 U/L   ALT 12 0 - 44 U/L   Alkaline Phosphatase 39 38 - 126 U/L   Total Bilirubin 0.8 0.0 - 1.2 mg/dL   GFR, Estimated >21 >30 mL/min   Anion gap 8 5 - 15     Assessment and Plan  1. Morning sickness (Primary) - Information provided on morning sickness - prescription for: Zofran 4 mg ODT every 8 hrs prn N/V - prescription for: Phenergan 25 mg po or pv every 6 hrs prn N/V  2. [redacted] weeks gestation of pregnancy   - Discharge home - Keep scheduled appt with [facility] on 07/29/2023 - Patient verbalized an understanding of the plan of care and agrees.    Almond Army, CNM 07/22/2023, 11:28 PM

## 2023-07-23 DIAGNOSIS — Z3A09 9 weeks gestation of pregnancy: Secondary | ICD-10-CM

## 2023-07-23 DIAGNOSIS — O210 Mild hyperemesis gravidarum: Secondary | ICD-10-CM

## 2023-07-23 LAB — COMPREHENSIVE METABOLIC PANEL WITH GFR
ALT: 12 U/L (ref 0–44)
AST: 15 U/L (ref 15–41)
Albumin: 4 g/dL (ref 3.5–5.0)
Alkaline Phosphatase: 39 U/L (ref 38–126)
Anion gap: 8 (ref 5–15)
BUN: 8 mg/dL (ref 6–20)
CO2: 22 mmol/L (ref 22–32)
Calcium: 9.2 mg/dL (ref 8.9–10.3)
Chloride: 102 mmol/L (ref 98–111)
Creatinine, Ser: 0.67 mg/dL (ref 0.44–1.00)
GFR, Estimated: >60 mL/min (ref >60)
Glucose, Bld: 82 mg/dL (ref 70–99)
Potassium: 3.7 mmol/L (ref 3.5–5.1)
Sodium: 132 mmol/L — ABNORMAL LOW (ref 135–145)
Total Bilirubin: 0.8 mg/dL (ref 0.0–1.2)
Total Protein: 7.9 g/dL (ref 6.5–8.1)

## 2023-07-23 LAB — GC/CHLAMYDIA PROBE AMP (~~LOC~~) NOT AT ARMC
Chlamydia: NEGATIVE
Comment: NEGATIVE
Comment: NORMAL
Neisseria Gonorrhea: NEGATIVE

## 2023-07-23 MED ORDER — ONDANSETRON 4 MG PO TBDP: 4.0000 mg | ORAL_TABLET | Freq: Three times a day (TID) | ORAL | 0 refills | Status: DC | PRN

## 2023-07-23 MED ORDER — SCOPOLAMINE 1 MG/3DAYS TD PT72
1.0000 | MEDICATED_PATCH | Freq: Once | TRANSDERMAL | Status: DC
  Administered 2023-07-23: 1.5 mg via TRANSDERMAL
  Filled 2023-07-23: qty 1

## 2023-07-23 MED ORDER — FAMOTIDINE IN NACL 20-0.9 MG/50ML-% IV SOLN
20.0000 mg | Freq: Once | INTRAVENOUS | Status: AC
  Administered 2023-07-23: 20 mg via INTRAVENOUS
  Filled 2023-07-23: qty 50

## 2023-07-23 MED ORDER — PROMETHAZINE HCL 25 MG PO TABS: 25.0000 mg | ORAL_TABLET | Freq: Four times a day (QID) | ORAL | 0 refills | Status: DC | PRN

## 2023-07-24 LAB — CULTURE, OB URINE: Culture: <10000 — AB

## 2023-07-25 ENCOUNTER — Encounter (HOSPITAL_BASED_OUTPATIENT_CLINIC_OR_DEPARTMENT_OTHER): Payer: Self-pay | Admitting: *Deleted

## 2023-07-25 ENCOUNTER — Telehealth (HOSPITAL_BASED_OUTPATIENT_CLINIC_OR_DEPARTMENT_OTHER): Payer: Self-pay | Admitting: *Deleted

## 2023-07-25 ENCOUNTER — Encounter: Payer: Self-pay | Admitting: Obstetrics & Gynecology

## 2023-07-25 NOTE — Telephone Encounter (Signed)
 Pt states that she was seen in the ED for dizziness, cramping and some pain. She reports that some lab testing was done and she has not received recommendations on the results. Pt reports seeing in mychart that she is positive for yeast. She reports some feeling of discomfort and requests medication to take for this.

## 2023-07-25 NOTE — Telephone Encounter (Signed)
LMOVM for pt to call office regarding recommendations

## 2023-07-29 ENCOUNTER — Ambulatory Visit (HOSPITAL_BASED_OUTPATIENT_CLINIC_OR_DEPARTMENT_OTHER): Payer: MEDICAID | Admitting: *Deleted

## 2023-07-29 ENCOUNTER — Encounter (HOSPITAL_BASED_OUTPATIENT_CLINIC_OR_DEPARTMENT_OTHER): Payer: Self-pay | Admitting: *Deleted

## 2023-07-29 VITALS — Wt 196.2 lb

## 2023-07-29 DIAGNOSIS — F32A Depression, unspecified: Secondary | ICD-10-CM | POA: Insufficient documentation

## 2023-07-29 DIAGNOSIS — Z3480 Encounter for supervision of other normal pregnancy, unspecified trimester: Secondary | ICD-10-CM | POA: Insufficient documentation

## 2023-07-29 DIAGNOSIS — F909 Attention-deficit hyperactivity disorder, unspecified type: Secondary | ICD-10-CM | POA: Insufficient documentation

## 2023-07-29 DIAGNOSIS — Z3490 Encounter for supervision of normal pregnancy, unspecified, unspecified trimester: Secondary | ICD-10-CM | POA: Insufficient documentation

## 2023-07-29 DIAGNOSIS — Z3A09 9 weeks gestation of pregnancy: Secondary | ICD-10-CM

## 2023-07-29 DIAGNOSIS — Z3481 Encounter for supervision of other normal pregnancy, first trimester: Secondary | ICD-10-CM | POA: Diagnosis not present

## 2023-07-29 MED ORDER — GOJJI WEIGHT SCALE MISC: 1.0000 | Freq: Once | 0 refills | Status: AC

## 2023-07-29 MED ORDER — BLOOD PRESSURE KIT DEVI: 1.0000 | Freq: Once | 0 refills | Status: AC

## 2023-07-29 NOTE — Addendum Note (Signed)
 Addended by: Marliss Simple on: 07/29/2023 03:45 PM   Modules accepted: Orders

## 2023-07-29 NOTE — Progress Notes (Signed)
 New OB Intake  I explained I am completing New OB Intake today. We discussed EDD of 02/19/2024, by Other Basis. Pt is [id number]. I reviewed her allergies, medications and Medical/Surgical/OB history.    Patient Active Problem List   Diagnosis Date Noted   Supervision of other normal pregnancy, antepartum 07/29/2023   Anxiety and depression 07/29/2023   ADHD 07/29/2023     Concerns addressed today  Delivery Plans Plans to deliver at [facility]. Discussed the nature of our practice with multiple providers including residents and students. Due to the size of the practice, the delivering provider may not be the same as those providing prenatal care.   Patient is not interested in water birth.  MyChart/Babyscripts MyChart access verified. I explained pt will have some visits in office and some virtually. Babyscripts instructions given and order placed.   Blood Pressure Cuff/Weight Scale Blood pressure cuff ordered for patient to pick-up from [employer]. Explained after first prenatal appt pt will check weekly and document in Babyscripts. Patient does not have weight scale; order sent to [facility], patient may track weight weekly in Babyscripts.  Anatomy US  Explained first scheduled US  will be around 19 weeks.    Is patient a candidate for Babyscripts Optimization? Yes, patient declined   First visit review I reviewed new OB appt with patient. Explained pt will be seen by Hezzie Loupe, CNM at first visit. Discussed Lin[place] genetic screening with patient. Pt would like Panorama and would like to know the gender, had some testing done for sickle cell in past, doesn't think she needs it.  Routine prenatal labs  collected today    Last Pap Diagnosis  Date Value Ref Range Status  04/11/2022   Final   - Negative for intraepithelial lesion or malignancy (NILM)    Marliss Simple, RN 07/29/2023  3:31 PM

## 2023-07-30 LAB — HEMOGLOBIN A1C
Est. average glucose Bld gHb Est-mCnc: 82 mg/dL
Hgb A1c MFr Bld: 4.5 % — ABNORMAL LOW (ref 4.8–5.6)

## 2023-07-30 LAB — RPR: RPR Ser Ql: NONREACTIVE

## 2023-07-30 LAB — HEPATITIS C ANTIBODY
HCV Ab: NEGATIVE
Hep C Virus Ab: NONREACTIVE

## 2023-07-30 LAB — RUBELLA SCREEN: Rubella Antibodies, IGG: 1.25 {index} (ref >0.99)

## 2023-07-30 LAB — HEPATITIS B SURFACE ANTIGEN: Hepatitis B Surface Ag: NEGATIVE

## 2023-07-30 LAB — ANTIBODY SCREEN: Antibody Screen: NEGATIVE

## 2023-07-31 ENCOUNTER — Encounter (HOSPITAL_BASED_OUTPATIENT_CLINIC_OR_DEPARTMENT_OTHER): Payer: Self-pay | Admitting: Certified Nurse Midwife

## 2023-08-03 LAB — PANORAMA PRENATAL TEST FULL PANEL:PANORAMA TEST PLUS 5 ADDITIONAL MICRODELETIONS: FETAL FRACTION: 4.9

## 2023-08-06 ENCOUNTER — Encounter (HOSPITAL_BASED_OUTPATIENT_CLINIC_OR_DEPARTMENT_OTHER): Payer: Self-pay | Admitting: Obstetrics & Gynecology

## 2023-08-07 ENCOUNTER — Ambulatory Visit (HOSPITAL_BASED_OUTPATIENT_CLINIC_OR_DEPARTMENT_OTHER): Payer: MEDICAID | Admitting: *Deleted

## 2023-08-07 VITALS — BP 108/70 | HR 89 | Wt 195.6 lb

## 2023-08-07 DIAGNOSIS — Z3480 Encounter for supervision of other normal pregnancy, unspecified trimester: Secondary | ICD-10-CM

## 2023-08-07 DIAGNOSIS — F419 Anxiety disorder, unspecified: Secondary | ICD-10-CM

## 2023-08-07 LAB — POCT URINALYSIS DIPSTICK OB
Appearance: NORMAL
Bilirubin, UA: NEGATIVE
Blood, UA: NEGATIVE
Glucose, UA: NEGATIVE
Nitrite, UA: NEGATIVE
Spec Grav, UA: 1.025 (ref 1.010–1.025)
Urobilinogen, UA: 1 U/dL
pH, UA: 6 (ref 5.0–8.0)

## 2023-08-07 NOTE — Progress Notes (Signed)
 Pt here with complaints of cramping that started yesterday and continuing through today. She reports that it was initially mild but has increased. She denies vaginal bleeding.  FHT obtained and reassuring. Clean catch urine obtained. Pt reports that she has not had a bowel movement in over a week. Advised pt that could be contributing to the cramping that she is experiencing. Pt provided with medication list for safe medications in pregnancy. Pt will go to pharmacy and pick up medication to help her have bowel movement. Pt will let us  know if symptoms do not improve or resolve. She will return on Monday for new OB intake with provider.

## 2023-08-08 LAB — CULTURE, OB URINE

## 2023-08-08 LAB — URINE CULTURE, OB REFLEX

## 2023-08-12 ENCOUNTER — Ambulatory Visit (INDEPENDENT_AMBULATORY_CARE_PROVIDER_SITE_OTHER): Payer: MEDICAID | Admitting: Certified Nurse Midwife

## 2023-08-12 ENCOUNTER — Ambulatory Visit (HOSPITAL_BASED_OUTPATIENT_CLINIC_OR_DEPARTMENT_OTHER): Payer: Self-pay | Admitting: Certified Nurse Midwife

## 2023-08-12 VITALS — BP 112/77 | HR 91 | Wt 193.8 lb

## 2023-08-12 DIAGNOSIS — Z8639 Personal history of other endocrine, nutritional and metabolic disease: Secondary | ICD-10-CM

## 2023-08-12 DIAGNOSIS — Z3A12 12 weeks gestation of pregnancy: Secondary | ICD-10-CM

## 2023-08-12 DIAGNOSIS — Z2839 Other underimmunization status: Secondary | ICD-10-CM | POA: Diagnosis not present

## 2023-08-12 DIAGNOSIS — Z3480 Encounter for supervision of other normal pregnancy, unspecified trimester: Secondary | ICD-10-CM

## 2023-08-12 DIAGNOSIS — O09899 Supervision of other high risk pregnancies, unspecified trimester: Secondary | ICD-10-CM

## 2023-08-12 DIAGNOSIS — Z9884 Bariatric surgery status: Secondary | ICD-10-CM

## 2023-08-12 DIAGNOSIS — O09891 Supervision of other high risk pregnancies, first trimester: Secondary | ICD-10-CM

## 2023-08-12 DIAGNOSIS — Z91199 Patient's noncompliance with other medical treatment and regimen due to unspecified reason: Secondary | ICD-10-CM

## 2023-08-12 DIAGNOSIS — O208 Other hemorrhage in early pregnancy: Secondary | ICD-10-CM

## 2023-08-12 DIAGNOSIS — Z8659 Personal history of other mental and behavioral disorders: Secondary | ICD-10-CM

## 2023-08-12 MED ORDER — ASPIRIN 81 MG PO TBEC: 81.0000 mg | DELAYED_RELEASE_TABLET | Freq: Every day | ORAL | 12 refills | Status: DC

## 2023-08-12 MED ORDER — FLUCONAZOLE 150 MG PO TABS: 150.0000 mg | ORAL_TABLET | ORAL | 0 refills | Status: DC | PRN

## 2023-08-12 NOTE — Progress Notes (Addendum)
    PRENATAL VISIT NOTE  Subjective:  Miranda Curtis is a 23 y.o. [id number] at [redacted]w[redacted]d being seen today for ongoing prenatal care.  She has a 5yo daughter "Miranda Curtis" and 60 month old son "Miranda Curtis" both doing well. Miranda Curtis started [employer]. 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 She lives with her supportive significant other Miranda Curtis and her 2 children. She is doing well emotionally at this time.    She is currently monitored for the following issues for this  pregnancy and has Supervision of other normal pregnancy, antepartum; Anxiety and depression; ADHD; Short interval between pregnancies affecting pregnancy, antepartum; and Maternal varicella, non-immune on their problem list.  Patient reports fatigue, nausea, no bleeding, and no leaking.  Contractions: Not present. Vag. Bleeding: None.  Movement: Absent. Denies leaking of fluid.   The following portions of the patient's history were reviewed and updated as appropriate: allergies, current medications, past family history, past medical history, past social history, past surgical history and problem list.   Objective:   Vitals:   08/12/23 1026  BP: 112/77  Pulse: 91  Weight: 193 lb 12.8 oz (87.9 kg)    Fetal Status: Fetal Heart Rate (bpm): 150   Movement: Absent     General:  Alert, oriented and cooperative. Patient is in no acute distress.  Skin: Skin is warm and dry. No rash noted.   Cardiovascular: Normal heart rate noted  Respiratory: Normal respiratory effort, no problems with respiration noted  Abdomen: Soft, gravid, appropriate for gestational age.  Pain/Pressure: Present     Pelvic: Cervical exam deferred        Extremities: Normal range of motion.  Edema: None  Mental Status: Normal mood and affect. Normal behavior. Normal judgment and thought content.   Assessment and Plan:  Pregnancy: [id number] at [redacted]w[redacted]d  1. Maternal varicella, non-immune (Primary) - Pt instructed to avoid contact with anyone with Chickenpox or Shingles  2. Short  interval between pregnancies affecting pregnancy, antepartum   3. Supervision of other normal pregnancy, antepartum - Start ASA 81mg  po once daily - Continue prenatal vitamin - Panorama Negative; Female  4. [redacted] weeks gestation of pregnancy   Preterm labor symptoms and general obstetric precautions including but not limited to vaginal bleeding, contractions, leaking of fluid and fetal movement were reviewed in detail with the patient. Please refer to After Visit Summary for other counseling recommendations.   No follow-ups on file.  Future Appointments  Date Time Provider Department Center  09/04/2023  9:15 AM Lillian Rein, MD [facility]  09/26/2023  9:00 AM [facility] PROVIDER 1 [facility]  09/26/2023  9:30 AM [facility] US2 [facility]  10/30/2023  9:15 AM Lillian Rein, MD [facility]  11/20/2023  8:35 AM Caldwell Kronenberger, Juvenal Opoka, CNM [facility]  12/11/2023  9:15 AM Donnalynn Wheeless, Juvenal Opoka, CNM [facility]  12/25/2023  9:15 AM Taven Strite, Juvenal Opoka, CNM [facility]  01/08/2024  9:15 AM Lillian Rein, MD [facility]  01/22/2024  9:35 AM Coben Godshall, Juvenal Opoka, CNM [facility]  01/29/2024  9:15 AM Lillian Rein, MD [facility]  02/05/2024  9:15 AM Hilda Lovings, Juvenal Opoka, CNM [facility]  02/12/2024  9:15 AM Lillian Rein, MD [facility]  02/19/2024  9:15 AM Seerat Peaden, Juvenal Opoka, CNM [facility]    Yolanda Hence, CNM

## 2023-08-12 NOTE — Progress Notes (Unsigned)
 INITIAL PRENATAL VISIT  Subjective:   Miranda Curtis is being seen today for her first obstetrical visit.  This {is/is not:9024} a planned pregnancy. This {is/is not:9024} a desired pregnancy.  She is at [redacted]w[redacted]d gestation by *** Her obstetrical history is significant for {ob risk factors:10154}. Relationship with FOB: {fob:16621}. Patient {does/does not:19097} intend to breast feed. Pregnancy history fully reviewed.  Patient reports {sx:14538}.  Indications for ASA therapy (per uptodate)   Two or more of the following: Obesity (body mass index >30 kg/m2) Yes Sociodemographic characteristics (African American race, low socioeconomic level) Yes   Review of Systems:   Review of Systems  Objective:    Obstetric History OB History  Gravida Para Term Preterm AB Living  6 2 2  3 2   SAB IAB Ectopic Multiple Live Births  2 1  0 2    # Outcome Date GA Lbr Len/2nd Weight Sex Type Anes PTL Lv  6 Current           5 IAB 02/2023          4 Term 10/25/22 [redacted]w[redacted]d 03:29 / 00:09 6 lb 10.2 oz (3.01 kg) M Vag-Spont EPI  LIV  3 SAB 04/10/19 [redacted]w[redacted]d         2 Term 11/08/17 [redacted]w[redacted]d 21:32 / 00:51 6 lb 11.9 oz (3.06 kg) F Vag-Spont EPI N LIV  1 SAB 10/07/16 [redacted]w[redacted]d           Past Medical History:  Diagnosis Date  . Anovulation 06/08/2020  . Anxiety   . Attention deficit hyperactivity disorder (ADHD)   . Depression   . Diabetes mellitus without complication (HCC)    states prior to gastric sleeve surgery-has resolved following surgery  . Eczema   . History of depression 11/18/2017  . History of diabetes mellitus 04/11/2022  . Maternal varicella, non-immune 05/09/2017  . Missed abortion 04/07/2019  . Vitamin B 12 deficiency 10/26/2017   05/09/22: b12 injection    . Vitamin D  deficiency 05/09/2022    Past Surgical History:  Procedure Laterality Date  . CHOLECYSTECTOMY N/A 03/13/2023   Procedure: LAPAROSCOPIC CHOLECYSTECTOMY WITH ICG DYE;  Surgeon: Edmon Gosling, MD;  Location: [facility];   Service: General;  Laterality: N/A;  . DILATION AND EVACUATION N/A 02/24/2023   Procedure: DILATATION AND EVACUATION;  Surgeon: Granville Layer, MD;  Location: [facility] OR;  Service: Gynecology;  Laterality: N/A;  . SLEEVE GASTROPLASTY  10/12/2021  . UPPER GASTROINTESTINAL ENDOSCOPY N/A     Current Outpatient Medications on File Prior to Visit  Medication Sig Dispense Refill  . ondansetron  (ZOFRAN -ODT) 4 MG disintegrating tablet Take 1 tablet (4 mg total) by mouth every 8 (eight) hours as needed. (Patient not taking: Reported on 07/29/2023) 60 tablet 0  . promethazine  (PHENERGAN ) 25 MG tablet Take 1 tablet (25 mg total) by mouth every 6 (six) hours as needed. Can insert vaginally, if unable to keep anything down (Patient not taking: Reported on 07/29/2023) 30 tablet 0   No current facility-administered medications on file prior to visit.    Allergies  Allergen Reactions  . Reglan  [Metoclopramide ] Anxiety and Other (See Comments)    Severe panic attack    Social History:  reports that she has never smoked. She has never used smokeless tobacco. She reports that she does not drink alcohol and does not use drugs.  Family History  Problem Relation Age of Onset  . Diabetes Mother   . Healthy Mother   . Sickle cell trait Mother   .  Sickle cell trait Father   . Rashes / Skin problems Father        eczema  . Asthma Sister   . Rashes / Skin problems Sister        eczema  . Rashes / Skin problems Sister        eczema  . Rashes / Skin problems Sister        eczema  . Sickle cell anemia Sister   . Rashes / Skin problems Sister        eczema  . Autism Brother   . Healthy Brother   . Autism Daughter   . Diabetes Maternal Grandmother   . Hypertension Maternal Grandmother   . Cancer Maternal Grandfather        prostate  . Cancer Paternal Grandmother 39       lung  . Healthy Paternal Grandfather   . Ovarian cancer Neg Hx   . Colon cancer Neg Hx   . Breast cancer Neg Hx     The following  portions of the patient's history were reviewed and updated as appropriate: allergies, current medications, past family history, past medical history, past social history, past surgical history and problem list.  Review of Systems Review of Systems    Physical Exam:  LMP  (Approximate) Comment: End of January CONSTITUTIONAL: Well-developed, well-nourished female in no acute distress.  HENT:  Normocephalic, atraumatic.  Oropharynx is clear and moist EYES: Conjunctivae normal. No scleral icterus.  NECK: Normal range of motion, supple, no masses.  Normal thyroid.  SKIN: Skin is warm and dry. No rash noted. Not diaphoretic. No erythema. No pallor. MUSCULOSKELETAL: Normal range of motion. No tenderness.  No cyanosis, clubbing, or edema.   NEUROLOGIC: Alert and oriented to person, place, and time. Normal muscle tone coordination.  PSYCHIATRIC: Normal mood and affect. Normal behavior. Normal judgment and thought content. CARDIOVASCULAR: Normal heart rate noted, regular rhythm RESPIRATORY: Clear to auscultation bilaterally. Effort and breath sounds normal, no problems with respiration noted. BREASTS: Symmetric in size. No masses, skin changes, nipple drainage, or lymphadenopathy. ABDOMEN: Soft, normal bowel sounds, no distention noted.  No tenderness, rebound or guarding. Fundal ht: *** PELVIC: Normal appearing external genitalia; normal appearing vaginal mucosa and cervix.  No abnormal discharge noted.  Pap smear ***obtained.  Normal uterine size, no other palpable masses, no uterine or adnexal tenderness.              Assessment:    Pregnancy: [id number]  1. Supervision of other normal pregnancy, antepartum (Primary) ***  2. Short interval between pregnancies affecting pregnancy, antepartum ***  3. [redacted] weeks gestation of pregnancy ***  4. Subchorionic hemorrhage of placenta in first trimester ***     Plan:    Prenatal Vitamin Aspirin  81mg  po once daily Anatomy US  scheduled Follow  up in 4 weeks. Weight gain recommendations per IOM guidelines reviewed: underweight/BMI 18.5 or less > 28 - 40 lbs; normal weight/BMI 18.5 - 24.9 > 25 - 35 lbs; overweight/BMI 25 - 29.9 > 15 - 25 lbs; obese/BMI  30 or more > 11 - 20 lbs.  Discussed clinic routines, schedule of care and testing, genetic screening options, involvement of students and residents under the direct supervision of APPs and doctors and presence of female providers. Pt verbalized understanding.   Yolanda Hence, CNM 08/12/2023 7:49 AM

## 2023-08-13 ENCOUNTER — Ambulatory Visit (HOSPITAL_BASED_OUTPATIENT_CLINIC_OR_DEPARTMENT_OTHER): Payer: MEDICAID | Admitting: *Deleted

## 2023-08-13 ENCOUNTER — Telehealth (HOSPITAL_BASED_OUTPATIENT_CLINIC_OR_DEPARTMENT_OTHER): Payer: Self-pay | Admitting: Certified Nurse Midwife

## 2023-08-13 ENCOUNTER — Encounter (HOSPITAL_BASED_OUTPATIENT_CLINIC_OR_DEPARTMENT_OTHER): Payer: Self-pay | Admitting: Certified Nurse Midwife

## 2023-08-13 VITALS — BP 133/84 | HR 102 | Wt 193.0 lb

## 2023-08-13 DIAGNOSIS — Z3A12 12 weeks gestation of pregnancy: Secondary | ICD-10-CM

## 2023-08-13 DIAGNOSIS — F419 Anxiety disorder, unspecified: Secondary | ICD-10-CM

## 2023-08-13 DIAGNOSIS — O209 Hemorrhage in early pregnancy, unspecified: Secondary | ICD-10-CM | POA: Insufficient documentation

## 2023-08-13 DIAGNOSIS — Z3480 Encounter for supervision of other normal pregnancy, unspecified trimester: Secondary | ICD-10-CM

## 2023-08-13 DIAGNOSIS — O26851 Spotting complicating pregnancy, first trimester: Secondary | ICD-10-CM

## 2023-08-13 NOTE — Telephone Encounter (Signed)
 Left message for patient to call back. Pt aware that we will work her in today for problem ob visit.  Miranda Curtis

## 2023-08-13 NOTE — Progress Notes (Addendum)
  NURSE VISIT- Fetal Heart Rate Check  SUBJECTIVE:  Miranda Curtis is a 23 y.o. [phone] female at [redacted]w[redacted]d, here for a fetal heart rate check for vaginal bleeding occurring this morning around 3 am. She has had some spotting today.    OBJECTIVE:  BP 133/84   Pulse (!) 102   Wt 193 lb (87.5 kg)   LMP  (Approximate) Comment: End of January  BMI 36.47 kg/m   Appears well, no apparent distress  FHR 162 via doppler   ASSESSMENT: [id number] at [redacted]w[redacted]d with present fetal heart rate  PLAN: Follow-up as scheduled  Marliss Simple  08/13/2023 1:28 PM

## 2023-08-20 ENCOUNTER — Inpatient Hospital Stay (HOSPITAL_COMMUNITY): Payer: MEDICAID

## 2023-08-20 ENCOUNTER — Encounter (HOSPITAL_COMMUNITY): Payer: Self-pay | Admitting: Family Medicine

## 2023-08-20 ENCOUNTER — Inpatient Hospital Stay (HOSPITAL_COMMUNITY)
Admission: AD | Admit: 2023-08-20 | Discharge: 2023-08-20 | Disposition: A | Discharge status: Home / Self Care | Payer: MEDICAID | Attending: Family Medicine | Admitting: Family Medicine

## 2023-08-20 DIAGNOSIS — Z3A13 13 weeks gestation of pregnancy: Secondary | ICD-10-CM

## 2023-08-20 DIAGNOSIS — O26891 Other specified pregnancy related conditions, first trimester: Secondary | ICD-10-CM | POA: Insufficient documentation

## 2023-08-20 DIAGNOSIS — R519 Headache, unspecified: Secondary | ICD-10-CM

## 2023-08-20 DIAGNOSIS — O26892 Other specified pregnancy related conditions, second trimester: Secondary | ICD-10-CM

## 2023-08-20 DIAGNOSIS — G932 Benign intracranial hypertension: Secondary | ICD-10-CM | POA: Insufficient documentation

## 2023-08-20 LAB — URINALYSIS, ROUTINE W REFLEX MICROSCOPIC
Bilirubin Urine: NEGATIVE
Glucose, UA: NEGATIVE mg/dL
Hgb urine dipstick: NEGATIVE
Ketones, ur: NEGATIVE mg/dL
Nitrite: NEGATIVE
Protein, ur: NEGATIVE mg/dL
Specific Gravity, Urine: 1.003 — ABNORMAL LOW (ref 1.005–1.030)
pH: 6 (ref 5.0–8.0)

## 2023-08-20 MED ORDER — ACETAMINOPHEN-CAFFEINE 500-65 MG PO TABS
2.0000 | ORAL_TABLET | Freq: Once | ORAL | Status: AC
  Administered 2023-08-20: 2 via ORAL
  Filled 2023-08-20: qty 2

## 2023-08-20 MED ORDER — CYCLOBENZAPRINE HCL 5 MG PO TABS
10.0000 mg | ORAL_TABLET | Freq: Once | ORAL | Status: AC
  Administered 2023-08-20: 10 mg via ORAL
  Filled 2023-08-20: qty 2

## 2023-08-20 MED ORDER — DIPHENHYDRAMINE HCL 25 MG PO CAPS
50.0000 mg | ORAL_CAPSULE | Freq: Once | ORAL | Status: AC
  Administered 2023-08-20: 50 mg via ORAL
  Filled 2023-08-20: qty 2

## 2023-08-20 MED ORDER — PROCHLORPERAZINE MALEATE 10 MG PO TABS
10.0000 mg | ORAL_TABLET | Freq: Once | ORAL | Status: AC
  Administered 2023-08-20: 10 mg via ORAL
  Filled 2023-08-20: qty 1

## 2023-08-20 NOTE — MAU Provider Note (Incomplete Revision)
 History     CSN: [id number]  Arrival date and time: 08/20/23 1520   Event Date/Time   First Provider Initiated Contact with Patient 08/20/23 1618      Chief Complaint  Patient presents with   Headache   Abdominal Pain   Headache  Associated symptoms include abdominal pain. Pertinent negatives include no back pain, coughing, dizziness, eye pain, fever, nausea, sore throat or vomiting.  Abdominal Pain Associated symptoms include headaches. Pertinent negatives include no diarrhea, dysuria, fever, nausea or vomiting.   Miranda Curtis  23 y.o. [id number] [redacted]w[redacted]d here with complaints of headache and lower abdominal cramping  Headache: Started 4 days ago. Located all over her head but also mostly in the front and on the posterior back of head. Reports trying Tylenol , taking every 6 hours. She thinks the dose is the typical dose. Last took this morning. She also had tried resting and increasing her fluid intake. She wakes up with the HA. She think the HA is getting worse. Associated with some dizziness. Located around her head/middle of forehead. Had gotten HA outside of pregnancy but usually they resolve much quicker.   Cramping/Abdominal pain: present for a day or so. Not worsening. FHR WNL in triage.   Denies LOF, VB, contractions, vaginal discharge.   OB History     Gravida  6   Para  2   Term  2   Preterm      AB  3   Living  2      SAB  2   IAB  1   Ectopic      Multiple  0   Live Births  2           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-has resolved following surgery   Eczema    History of depression 11/18/2017   History of diabetes mellitus 04/11/2022   Maternal varicella, non-immune 05/09/2017   Missed abortion 04/07/2019   Vitamin B 12 deficiency 10/26/2017   05/09/22:  b12 injection     Vitamin D  deficiency 05/09/2022    Past Surgical History:  Procedure Laterality Date   CHOLECYSTECTOMY N/A 03/13/2023   Procedure: LAPAROSCOPIC CHOLECYSTECTOMY WITH ICG DYE;  Surgeon: Edmon Gosling, MD;  Location: [facility];  Service: General;  Laterality: N/A;   DILATION AND EVACUATION N/A 02/24/2023   Procedure: DILATATION AND EVACUATION;  Surgeon: Granville Layer, MD;  Location: [facility] OR;  Service: Gynecology;  Laterality: N/A;   SLEEVE GASTROPLASTY  10/12/2021   UPPER GASTROINTESTINAL ENDOSCOPY N/A     Family History  Problem Relation Age of Onset   Diabetes Mother    Healthy Mother    Sickle cell trait Mother    Sickle cell trait Father    Rashes / Skin problems Father        eczema   Asthma Sister    Rashes / Skin problems Sister        eczema   Rashes / Skin problems Sister        eczema   Rashes / Skin problems Sister        eczema   Sickle cell anemia Sister    Rashes / Skin problems Sister        eczema   Autism Brother    Healthy Brother  Autism Daughter    Diabetes Maternal Grandmother    Hypertension Maternal Grandmother    Cancer Maternal Grandfather        prostate   Cancer Paternal Grandmother 67       lung   Healthy Paternal Grandfather    Ovarian cancer Neg Hx    Colon cancer Neg Hx    Breast cancer Neg Hx     Social History   Tobacco Use   Smoking status: Never   Smokeless tobacco: Never  Vaping Use   Vaping status: Never Used  Substance Use Topics   Alcohol use: No   Drug use: No    Allergies:  Allergies  Allergen Reactions   Reglan  [Metoclopramide ] Anxiety and Other (See Comments)    Severe panic attack    Medications Prior to Admission  Medication Sig Dispense Refill Last Dose/Taking   aspirin  EC 81 MG tablet Take 1 tablet (81 mg total) by mouth daily. Swallow whole. 100 tablet 12 Past Week   fluconazole  (DIFLUCAN ) 150 MG tablet Take 1 tablet (150 mg total) by mouth every other day as needed. 2 tablet 0 Past  Month   ondansetron  (ZOFRAN -ODT) 4 MG disintegrating tablet Take 1 tablet (4 mg total) by mouth every 8 (eight) hours as needed. 60 tablet 0 08/20/2023   promethazine  (PHENERGAN ) 25 MG tablet Take 1 tablet (25 mg total) by mouth every 6 (six) hours as needed. Can insert vaginally, if unable to keep anything down 30 tablet 0 Past Month    Review of Systems  Constitutional:  Negative for chills and fever.  HENT:  Negative for congestion and sore throat.   Eyes:  Negative for pain and visual disturbance.  Respiratory:  Negative for cough, chest tightness and shortness of breath.   Cardiovascular:  Negative for chest pain.  Gastrointestinal:  Positive for abdominal pain. Negative for diarrhea, nausea and vomiting.  Endocrine: Negative for cold intolerance and heat intolerance.  Genitourinary:  Negative for dysuria and flank pain.  Musculoskeletal:  Negative for back pain.  Skin:  Negative for rash.  Allergic/Immunologic: Negative for food allergies.  Neurological:  Positive for headaches. Negative for dizziness and light-headedness.  Psychiatric/Behavioral:  Negative for agitation.    Physical Exam   Blood pressure 117/61, pulse 93, temperature 98.6 F (37 C), temperature source Oral, resp. rate 16, height 5\' 1"  (1.549 m), weight 89.9 kg, SpO2 100%, not currently breastfeeding.  Physical Exam Vitals and nursing note reviewed.  Constitutional:      General: She is not in acute distress.    Appearance: She is well-developed.  HENT:     Head: Normocephalic and atraumatic.  Eyes:     General: No scleral icterus.    Conjunctiva/sclera: Conjunctivae normal.  Cardiovascular:     Rate and Rhythm: Normal rate.  Pulmonary:     Effort: Pulmonary effort is normal.  Chest:     Chest wall: No tenderness.  Abdominal:     Palpations: Abdomen is soft.     Tenderness: There is no abdominal tenderness. There is no guarding or rebound.  Genitourinary:    Vagina: Normal.  Musculoskeletal:         General: Normal range of motion.     Cervical back: Normal range of motion and neck supple.  Skin:    General: Skin is warm and dry.     Findings: No rash.  Neurological:     Mental Status: She is alert and oriented to person, place, and time.  GCS: GCS eye subscore is 4. GCS verbal subscore is 5. GCS motor subscore is 6.     Cranial Nerves: Cranial nerves 2-12 are intact.     Sensory: Sensation is intact.     Motor: Motor function is intact.     Coordination: Coordination is intact.     MAU Course  Procedures  MDM- High   Patient with worse HA of her life without resolution with OTC meds at home.    DDX includes tension, migraine. Less likely central venous thrombosis. No focal findings on neuro exam, less likely stroke. Will trial conservative medications treatment if not improved   Orders Placed This Encounter  Procedures   MR MRV HEAD WO CM    Standing Status:   Standing    Number of Occurrences:   1    What is the patient's sedation requirement?:   No Sedation    Does the patient have a pacemaker or implanted devices?:   No   Urinalysis, Routine w reflex microscopic -Urine, Clean Catch    Standing Status:   Standing    Number of Occurrences:   1    Specimen Source:   Urine, Clean Catch [76]   Results for orders placed or performed during the hospital encounter of 08/20/23 (from the past 24 hours)  Urinalysis, Routine w reflex microscopic -Urine, Clean Catch     Status: Abnormal   Collection Time: 08/20/23  6:12 PM  Result Value Ref Range   Color, Urine STRAW (A) YELLOW   APPearance HAZY (A) CLEAR   Specific Gravity, Urine 1.003 (L) 1.005 - 1.030   pH 6.0 5.0 - 8.0   Glucose, UA NEGATIVE NEGATIVE mg/dL   Hgb urine dipstick NEGATIVE NEGATIVE   Bilirubin Urine NEGATIVE NEGATIVE   Ketones, ur NEGATIVE NEGATIVE mg/dL   Protein, ur NEGATIVE NEGATIVE mg/dL   Nitrite NEGATIVE NEGATIVE   Leukocytes,Ua LARGE (A) NEGATIVE   RBC / HPF 0-5 0 - 5 RBC/hpf   WBC, UA 11-20  0 - 5 WBC/hpf   Bacteria, UA RARE (A) NONE SEEN   Squamous Epithelial / HPF 6-10 0 - 5 /HPF    8:21 PM Reassessed after medications see below. Patient reports no improvement. Reviewed recommendation for head imaging. Patient agrees.  Meds ordered this encounter  Medications   acetaminophen -caffeine  (EXCEDRIN TENSION HEADACHE) 500-65 MG per tablet 2 tablet   cyclobenzaprine  (FLEXERIL ) tablet 10 mg   prochlorperazine (COMPAZINE) tablet 10 mg   diphenhydrAMINE  (BENADRYL ) capsule 50 mg   8:22 PM Care assumed by CNM Lang Pipes. Hand off provided.   Assessment and Plan    Abner Ables 08/20/2023, 4:27 PM   Felt much better after treatment States headache is mostly resolved  A: Single IUP at [redacted]w[redacted]d Headache  Plan: Discharge home Fluids and regular diet Followup in office Encouraged to return if she develops worsening of symptoms, increase in pain, fever, or other concerning symptoms.   Harlee Lichtenstein, CNM

## 2023-08-20 NOTE — MAU Note (Signed)
 Miranda Curtis is a 23 y.o. at [redacted]w[redacted]d here in MAU reporting: has had the same HA for 4 days.  Getting worse. Has taken Tylenol , increased her fluids and it persists.  Also having some cramping in lower abd now. Was bleeding last wk, but that stopped. Onset of complaint: 4days Pain score: HA 8, abd 6 Vitals:   08/20/23 1552  BP: 117/61  Pulse: 93  Resp: 16  Temp: 98.6 F (37 C)  SpO2: 100%     FHT:148 Lab orders placed from triage:  urine obtained

## 2023-08-20 NOTE — MAU Provider Note (Signed)
 History     CSN: [id number]  Arrival date and time: 08/20/23 1520   Event Date/Time   First Provider Initiated Contact with Patient 08/20/23 1618      Chief Complaint  Patient presents with   Headache   Abdominal Pain   Headache  Associated symptoms include abdominal pain. Pertinent negatives include no back pain, coughing, dizziness, eye pain, fever, nausea, sore throat or vomiting.  Abdominal Pain Associated symptoms include headaches. Pertinent negatives include no diarrhea, dysuria, fever, nausea or vomiting.   Miranda Curtis  23 y.o. [id number] [redacted]w[redacted]d here with complaints of headache and lower abdominal cramping  Headache: Started 4 days ago. Located all over her head but also mostly in the front and on the posterior back of head. Reports trying Tylenol , taking every 6 hours. She thinks the dose is the typical dose. Last took this morning. She also had tried resting and increasing her fluid intake. She wakes up with the HA. She think the HA is getting worse. Associated with some dizziness. Located around her head/middle of forehead. Had gotten HA outside of pregnancy but usually they resolve much quicker.   Cramping/Abdominal pain: present for a day or so. Not worsening. FHR WNL in triage.   Denies LOF, VB, contractions, vaginal discharge.   OB History     Gravida  6   Para  2   Term  2   Preterm      AB  3   Living  2      SAB  2   IAB  1   Ectopic      Multiple  0   Live Births  2           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-has resolved following surgery   Eczema    History of depression 11/18/2017   History of diabetes mellitus 04/11/2022   Maternal varicella, non-immune 05/09/2017   Missed abortion 04/07/2019   Vitamin B 12 deficiency 10/26/2017   05/09/22:  b12 injection     Vitamin D  deficiency 05/09/2022    Past Surgical History:  Procedure Laterality Date   CHOLECYSTECTOMY N/A 03/13/2023   Procedure: LAPAROSCOPIC CHOLECYSTECTOMY WITH ICG DYE;  Surgeon: Edmon Gosling, MD;  Location: [facility];  Service: General;  Laterality: N/A;   DILATION AND EVACUATION N/A 02/24/2023   Procedure: DILATATION AND EVACUATION;  Surgeon: Granville Layer, MD;  Location: [facility];  Service: Gynecology;  Laterality: N/A;   SLEEVE GASTROPLASTY  10/12/2021   UPPER GASTROINTESTINAL ENDOSCOPY N/A     Family History  Problem Relation Age of Onset   Diabetes Mother    Healthy Mother    Sickle cell trait Mother    Sickle cell trait Father    Rashes / Skin problems Father        eczema   Asthma Sister    Rashes / Skin problems Sister        eczema   Rashes / Skin problems Sister        eczema   Rashes / Skin problems Sister        eczema   Sickle cell anemia Sister    Rashes / Skin problems Sister        eczema   Autism Brother    Healthy Brother  Autism Daughter    Diabetes Maternal Grandmother    Hypertension Maternal Grandmother    Cancer Maternal Grandfather        prostate   Cancer Paternal Grandmother 67       lung   Healthy Paternal Grandfather    Ovarian cancer Neg Hx    Colon cancer Neg Hx    Breast cancer Neg Hx     Social History   Tobacco Use   Smoking status: Never   Smokeless tobacco: Never  Vaping Use   Vaping status: Never Used  Substance Use Topics   Alcohol use: No   Drug use: No    Allergies:  Allergies  Allergen Reactions   Reglan  [Metoclopramide ] Anxiety and Other (See Comments)    Severe panic attack    Medications Prior to Admission  Medication Sig Dispense Refill Last Dose/Taking   aspirin  EC 81 MG tablet Take 1 tablet (81 mg total) by mouth daily. Swallow whole. 100 tablet 12 Past Week   fluconazole  (DIFLUCAN ) 150 MG tablet Take 1 tablet (150 mg total) by mouth every other day as needed. 2 tablet 0 Past  Month   ondansetron  (ZOFRAN -ODT) 4 MG disintegrating tablet Take 1 tablet (4 mg total) by mouth every 8 (eight) hours as needed. 60 tablet 0 08/20/2023   promethazine  (PHENERGAN ) 25 MG tablet Take 1 tablet (25 mg total) by mouth every 6 (six) hours as needed. Can insert vaginally, if unable to keep anything down 30 tablet 0 Past Month    Review of Systems  Constitutional:  Negative for chills and fever.  HENT:  Negative for congestion and sore throat.   Eyes:  Negative for pain and visual disturbance.  Respiratory:  Negative for cough, chest tightness and shortness of breath.   Cardiovascular:  Negative for chest pain.  Gastrointestinal:  Positive for abdominal pain. Negative for diarrhea, nausea and vomiting.  Endocrine: Negative for cold intolerance and heat intolerance.  Genitourinary:  Negative for dysuria and flank pain.  Musculoskeletal:  Negative for back pain.  Skin:  Negative for rash.  Allergic/Immunologic: Negative for food allergies.  Neurological:  Positive for headaches. Negative for dizziness and light-headedness.  Psychiatric/Behavioral:  Negative for agitation.    Physical Exam   Blood pressure 117/61, pulse 93, temperature 98.6 F (37 C), temperature source Oral, resp. rate 16, height 5\' 1"  (1.549 m), weight 89.9 kg, SpO2 100%, not currently breastfeeding.  Physical Exam Vitals and nursing note reviewed.  Constitutional:      General: She is not in acute distress.    Appearance: She is well-developed.  HENT:     Head: Normocephalic and atraumatic.  Eyes:     General: No scleral icterus.    Conjunctiva/sclera: Conjunctivae normal.  Cardiovascular:     Rate and Rhythm: Normal rate.  Pulmonary:     Effort: Pulmonary effort is normal.  Chest:     Chest wall: No tenderness.  Abdominal:     Palpations: Abdomen is soft.     Tenderness: There is no abdominal tenderness. There is no guarding or rebound.  Genitourinary:    Vagina: Normal.  Musculoskeletal:         General: Normal range of motion.     Cervical back: Normal range of motion and neck supple.  Skin:    General: Skin is warm and dry.     Findings: No rash.  Neurological:     Mental Status: She is alert and oriented to person, place, and time.  GCS: GCS eye subscore is 4. GCS verbal subscore is 5. GCS motor subscore is 6.     Cranial Nerves: Cranial nerves 2-12 are intact.     Sensory: Sensation is intact.     Motor: Motor function is intact.     Coordination: Coordination is intact.     MAU Course  Procedures  MDM- High   Patient with worse HA of her life without resolution with OTC meds at home.    DDX includes tension, migraine. Less likely central venous thrombosis. No focal findings on neuro exam, less likely stroke. Will trial conservative medications treatment if not improved   Orders Placed This Encounter  Procedures   MR MRV HEAD WO CM    Standing Status:   Standing    Number of Occurrences:   1    What is the patient's sedation requirement?:   No Sedation    Does the patient have a pacemaker or implanted devices?:   No   Urinalysis, Routine w reflex microscopic -Urine, Clean Catch    Standing Status:   Standing    Number of Occurrences:   1    Specimen Source:   Urine, Clean Catch [76]   Results for orders placed or performed during the hospital encounter of 08/20/23 (from the past 24 hours)  Urinalysis, Routine w reflex microscopic -Urine, Clean Catch     Status: Abnormal   Collection Time: 08/20/23  6:12 PM  Result Value Ref Range   Color, Urine STRAW (A) YELLOW   APPearance HAZY (A) CLEAR   Specific Gravity, Urine 1.003 (L) 1.005 - 1.030   pH 6.0 5.0 - 8.0   Glucose, UA NEGATIVE NEGATIVE mg/dL   Hgb urine dipstick NEGATIVE NEGATIVE   Bilirubin Urine NEGATIVE NEGATIVE   Ketones, ur NEGATIVE NEGATIVE mg/dL   Protein, ur NEGATIVE NEGATIVE mg/dL   Nitrite NEGATIVE NEGATIVE   Leukocytes,Ua LARGE (A) NEGATIVE   RBC / HPF 0-5 0 - 5 RBC/hpf   WBC, UA 11-20  0 - 5 WBC/hpf   Bacteria, UA RARE (A) NONE SEEN   Squamous Epithelial / HPF 6-10 0 - 5 /HPF    8:21 PM Reassessed after medications see below. Patient reports no improvement. Reviewed recommendation for head imaging. Patient agrees.  Meds ordered this encounter  Medications   acetaminophen -caffeine  (EXCEDRIN TENSION HEADACHE) 500-65 MG per tablet 2 tablet   cyclobenzaprine  (FLEXERIL ) tablet 10 mg   prochlorperazine (COMPAZINE) tablet 10 mg   diphenhydrAMINE  (BENADRYL ) capsule 50 mg   8:22 PM Care assumed by CNM Lang Pipes. Hand off provided.   Assessment and Plan    Miranda Curtis 08/20/2023, 4:27 PM   Felt much better after treatment States headache is mostly resolved  A: Single IUP at [redacted]w[redacted]d Headache  Plan: Discharge home Fluids and regular diet Followup in office Encouraged to return if she develops worsening of symptoms, increase in pain, fever, or other concerning symptoms.   Miranda Curtis, CNM

## 2023-08-23 ENCOUNTER — Encounter (HOSPITAL_BASED_OUTPATIENT_CLINIC_OR_DEPARTMENT_OTHER): Payer: Self-pay | Admitting: Certified Nurse Midwife

## 2023-08-26 ENCOUNTER — Other Ambulatory Visit (HOSPITAL_BASED_OUTPATIENT_CLINIC_OR_DEPARTMENT_OTHER): Payer: Self-pay

## 2023-08-26 ENCOUNTER — Ambulatory Visit (HOSPITAL_BASED_OUTPATIENT_CLINIC_OR_DEPARTMENT_OTHER): Payer: MEDICAID | Admitting: Certified Nurse Midwife

## 2023-08-26 VITALS — BP 111/70 | HR 95 | Wt 199.6 lb

## 2023-08-26 DIAGNOSIS — R319 Hematuria, unspecified: Secondary | ICD-10-CM

## 2023-08-26 DIAGNOSIS — R519 Headache, unspecified: Secondary | ICD-10-CM | POA: Diagnosis not present

## 2023-08-26 DIAGNOSIS — Z3A14 14 weeks gestation of pregnancy: Secondary | ICD-10-CM

## 2023-08-26 DIAGNOSIS — Z3482 Encounter for supervision of other normal pregnancy, second trimester: Secondary | ICD-10-CM

## 2023-08-26 DIAGNOSIS — Z3480 Encounter for supervision of other normal pregnancy, unspecified trimester: Secondary | ICD-10-CM

## 2023-08-26 LAB — POCT URINALYSIS DIPSTICK
Bilirubin, UA: NEGATIVE
Glucose, UA: NEGATIVE
Nitrite, UA: NEGATIVE
Protein, UA: POSITIVE — AB
Spec Grav, UA: >=1.03 — AB (ref 1.010–1.025)
Urobilinogen, UA: 1 U/dL
pH, UA: 6 (ref 5.0–8.0)

## 2023-08-26 MED ORDER — BUTALBITAL-APAP-CAFFEINE 50-325-40 MG PO TABS: 2.0000 | ORAL_TABLET | Freq: Two times a day (BID) | ORAL | 0 refills | Status: DC | PRN

## 2023-08-26 MED ORDER — BUTALBITAL-APAP-CAFFEINE 50-325-40 MG PO TABS
2.0000 | ORAL_TABLET | Freq: Two times a day (BID) | ORAL | 0 refills | Status: DC | PRN
  Filled 2023-08-26: qty 30, 8d supply, fill #0

## 2023-08-26 NOTE — Progress Notes (Addendum)
 PRENATAL VISIT NOTE    Subjective:  Miranda Curtis is a 23 y.o. [id number] at [redacted]w[redacted]d being seen today for ongoing prenatal care.  She is currently monitored for the following issues for this  pregnancy and has Supervision of other normal pregnancy, antepartum; Anxiety and depression; ADHD; Short interval between pregnancies affecting pregnancy, antepartum; Maternal varicella, non-immune; and First trimester bleeding on their problem list.  Patient reports that she has been noticing some orange spots in the toilet after voiding on occasion (not consistently). See photo under Media. She also noticed the orange substance after wiping. Patient verbalizes that she feels like the substance came from her vagina as well as the rectum. She denies vaginal spotting or bleeding. Speculum exam performed today and was negative/normal with no abnormality  noted of vagina, cervix. Or rectal/perineal region.   Pt reports an ongoing moderate headache which as not resolved with Tylenol  or with the medication prescribed in MAU. States the headache has been going on for several days.   Denies leaking of fluid.   The following portions of the patient's history were reviewed and updated as appropriate: allergies, current medications, past family history, past medical history, past social history, past surgical history and problem list.   Objective:    Vitals:   08/26/23 1603  BP: 111/70  Pulse: 95  Weight: 199 lb 9.6 oz (90.5 kg)    Fetal Status:  Fetal Heart Rate (bpm): 150        General: Alert, oriented and cooperative. Patient is in no acute distress.  Skin: Skin is warm and dry. No rash noted.   Cardiovascular: Normal heart rate noted  Respiratory: Normal respiratory effort, no problems with respiration noted  Abdomen: Soft, gravid, appropriate for gestational age.  Pain/Pressure: Present     Pelvic: Spec exam: normal appearing cervix. No spotting or bleeding noted.         Extremities: Normal range  of motion.  Edema: None  Mental Status: Normal mood and affect. Normal behavior. Normal judgment and thought content.   Assessment and Plan:  Pregnancy: [id number] at [redacted]w[redacted]d  1. Maternal varicella, non-immune (Primary) - Pt instructed to avoid contact with anyone with Chickenpox or Shingles   2. Short interval between pregnancies affecting pregnancy, antepartum    3. Supervision of other normal pregnancy, antepartum -  ASA 81mg  po once daily - Continue prenatal vitamin - Panorama Negative; Female - Plan AFP at 15-20 weeks   4. [redacted] weeks gestation of pregnancy  5. Headache in Pregnancy - Pt will try Fioricet  2 tabs po to try to resolve headache   Preterm labor symptoms and general obstetric precautions including but not limited to vaginal bleeding, contractions, leaking of fluid and fetal movement were reviewed in detail with the patient. Please refer to After Visit Summary for other counseling recommendations.   No follow-ups on file.  Future Appointments  Date Time Provider Department Center  09/04/2023  9:15 AM Lillian Rein, MD [facility]  09/26/2023  9:00 AM [facility] PROVIDER 1 [facility]  09/26/2023  9:30 AM [facility] US2 [facility]  10/30/2023  9:15 AM Lillian Rein, MD [facility]  11/20/2023  8:35 AM Coral Soler, Juvenal Opoka, CNM [facility]  12/11/2023  9:15 AM Khadijatou Borak, Juvenal Opoka, CNM [facility]  12/25/2023  9:15 AM Karolina Zamor, Juvenal Opoka, CNM [facility]  01/08/2024  9:15 AM Lillian Rein, MD [facility]  01/22/2024  9:35 AM Hilda Lovings, Juvenal Opoka, CNM [facility]  01/29/2024  9:15 AM Lillian Rein, MD [facility]  02/05/2024  9:15 AM Hilda Lovings, Juvenal Opoka, CNM [facility]  02/12/2024  9:15 AM Lillian Rein, MD [facility]  02/19/2024  9:15 AM Matison Nuccio, Juvenal Opoka, CNM [facility]    Yolanda Hence, CNM

## 2023-08-27 DIAGNOSIS — R519 Headache, unspecified: Secondary | ICD-10-CM | POA: Insufficient documentation

## 2023-08-28 LAB — URINE CULTURE

## 2023-08-29 ENCOUNTER — Ambulatory Visit (HOSPITAL_BASED_OUTPATIENT_CLINIC_OR_DEPARTMENT_OTHER): Payer: Self-pay | Admitting: Certified Nurse Midwife

## 2023-09-04 ENCOUNTER — Encounter (HOSPITAL_BASED_OUTPATIENT_CLINIC_OR_DEPARTMENT_OTHER): Payer: MEDICAID | Admitting: Obstetrics & Gynecology

## 2023-09-11 ENCOUNTER — Ambulatory Visit (INDEPENDENT_AMBULATORY_CARE_PROVIDER_SITE_OTHER): Payer: MEDICAID | Admitting: Certified Nurse Midwife

## 2023-09-11 ENCOUNTER — Encounter (HOSPITAL_BASED_OUTPATIENT_CLINIC_OR_DEPARTMENT_OTHER): Payer: Self-pay | Admitting: Certified Nurse Midwife

## 2023-09-11 VITALS — BP 118/74 | HR 86 | Wt 195.8 lb

## 2023-09-11 DIAGNOSIS — O99210 Obesity complicating pregnancy, unspecified trimester: Secondary | ICD-10-CM

## 2023-09-11 DIAGNOSIS — E669 Obesity, unspecified: Secondary | ICD-10-CM

## 2023-09-11 DIAGNOSIS — O99212 Obesity complicating pregnancy, second trimester: Secondary | ICD-10-CM

## 2023-09-11 DIAGNOSIS — O09899 Supervision of other high risk pregnancies, unspecified trimester: Secondary | ICD-10-CM

## 2023-09-11 DIAGNOSIS — Z3480 Encounter for supervision of other normal pregnancy, unspecified trimester: Secondary | ICD-10-CM

## 2023-09-11 DIAGNOSIS — Z3A17 17 weeks gestation of pregnancy: Secondary | ICD-10-CM

## 2023-09-11 DIAGNOSIS — Z9884 Bariatric surgery status: Secondary | ICD-10-CM

## 2023-09-11 DIAGNOSIS — O99842 Bariatric surgery status complicating pregnancy, second trimester: Secondary | ICD-10-CM | POA: Diagnosis not present

## 2023-09-11 NOTE — Progress Notes (Signed)
    PRENATAL VISIT NOTE  Subjective:  Miranda Curtis is a 23 y.o. [id number] at [redacted]w[redacted]d being seen today for ongoing prenatal care.  She is currently monitored for the following issues for this  pregnancy and has Supervision of other normal pregnancy, antepartum; Anxiety and depression; ADHD; Short interval between pregnancies affecting pregnancy, antepartum; Maternal varicella, non-immune; First trimester bleeding; and Nonintractable headache on their problem list.  Patient reports no bleeding, no contractions, no cramping, no leaking, and has pelvic pain and back pain.  Contractions: Not present. Vag. Bleeding: None.  Movement: Absent. Denies leaking of fluid.   The following portions of the patient's history were reviewed and updated as appropriate: allergies, current medications, past family history, past medical history, past social history, past surgical history and problem list.   Objective:    Vitals:   09/11/23 1005  BP: 118/74  Pulse: 86  Weight: 195 lb 12.8 oz (88.8 kg)    Fetal Status:  Fetal Heart Rate (bpm): 148   Movement: Absent    General: Alert, oriented and cooperative. Patient is in no acute distress.  Skin: Skin is warm and dry. No rash noted.   Cardiovascular: Normal heart rate noted  Respiratory: Normal respiratory effort, no problems with respiration noted  Abdomen: Soft, gravid, appropriate for gestational age.  Pain/Pressure: Present     Pelvic: Cervical exam deferred        Extremities: Normal range of motion.  Edema: None  Mental Status: Normal mood and affect. Normal behavior. Normal judgment and thought content.   Assessment and Plan:  Pregnancy: [id number] at [redacted]w[redacted]d 1. Supervision of other normal pregnancy, antepartum (Primary)  1. Maternal varicella, non-immune (Primary) - Pt instructed to avoid contact with anyone with Chickenpox or Shingles   2. Short interval between pregnancies affecting pregnancy, antepartum    3. Supervision of other normal  pregnancy, antepartum -  ASA 81mg  po once daily - Continue prenatal vitamin - Panorama Negative; Female - AFP pending    Preterm labor symptoms and general obstetric precautions including but not limited to vaginal bleeding, contractions, leaking of fluid and fetal movement were reviewed in detail with the patient. Please refer to After Visit Summary for other counseling recommendations.   No follow-ups on file.  Future Appointments  Date Time Provider Department Center  09/26/2023  9:00 AM [facility] PROVIDER 1 [facility]  09/26/2023  9:30 AM [facility] US2 [facility]  10/29/2023  3:15 PM Zelma Hidden, FNP [facility]  11/20/2023  8:35 AM Herlinda Heady, Juvenal Opoka, CNM [facility]  12/11/2023  9:15 AM Shaily Librizzi, Juvenal Opoka, CNM [facility]  12/25/2023  9:15 AM Jacelynn Hayton, Juvenal Opoka, CNM [facility]  01/08/2024  9:15 AM Lillian Rein, MD [facility]  01/22/2024  9:35 AM Trevor Duty, Juvenal Opoka, CNM [facility]  01/29/2024  9:15 AM Lillian Rein, MD [facility]  02/05/2024  9:15 AM Hilda Lovings, Juvenal Opoka, CNM [facility]  02/12/2024  9:15 AM Lillian Rein, MD [facility]  02/19/2024  9:15 AM Zuriah Bordas, Juvenal Opoka, CNM [facility]    Yolanda Hence, CNM

## 2023-09-13 ENCOUNTER — Ambulatory Visit (HOSPITAL_BASED_OUTPATIENT_CLINIC_OR_DEPARTMENT_OTHER): Payer: Self-pay | Admitting: Certified Nurse Midwife

## 2023-09-13 LAB — AFP, SERUM, OPEN SPINA BIFIDA
AFP MoM: 1.31
AFP Value: 46.3 ng/mL
Gest. Age on Collection Date: 17 wk
Maternal Age At EDD: 24.4 a
OSBR Risk 1 IN: 9327
Test Results:: NEGATIVE
Weight: 195 [lb_av]

## 2023-09-16 ENCOUNTER — Encounter (HOSPITAL_BASED_OUTPATIENT_CLINIC_OR_DEPARTMENT_OTHER): Payer: Self-pay | Admitting: Certified Nurse Midwife

## 2023-09-16 DIAGNOSIS — O99210 Obesity complicating pregnancy, unspecified trimester: Secondary | ICD-10-CM | POA: Insufficient documentation

## 2023-09-19 ENCOUNTER — Other Ambulatory Visit (HOSPITAL_BASED_OUTPATIENT_CLINIC_OR_DEPARTMENT_OTHER): Payer: Self-pay | Admitting: Certified Nurse Midwife

## 2023-09-19 MED ORDER — FLUCONAZOLE 150 MG PO TABS
150.0000 mg | ORAL_TABLET | ORAL | 2 refills | Status: DC | PRN
Start: 2023-09-19 — End: 2023-10-03

## 2023-09-26 ENCOUNTER — Ambulatory Visit: Payer: MEDICAID | Admitting: Maternal & Fetal Medicine

## 2023-09-26 ENCOUNTER — Other Ambulatory Visit: Payer: Self-pay | Admitting: *Deleted

## 2023-09-26 ENCOUNTER — Ambulatory Visit: Payer: MEDICAID | Attending: Obstetrics & Gynecology

## 2023-09-26 VITALS — BP 112/57 | HR 101

## 2023-09-26 DIAGNOSIS — Z3A19 19 weeks gestation of pregnancy: Secondary | ICD-10-CM | POA: Insufficient documentation

## 2023-09-26 DIAGNOSIS — O99842 Bariatric surgery status complicating pregnancy, second trimester: Secondary | ICD-10-CM

## 2023-09-26 DIAGNOSIS — O09892 Supervision of other high risk pregnancies, second trimester: Secondary | ICD-10-CM | POA: Diagnosis not present

## 2023-09-26 DIAGNOSIS — O99210 Obesity complicating pregnancy, unspecified trimester: Secondary | ICD-10-CM

## 2023-09-26 DIAGNOSIS — E669 Obesity, unspecified: Secondary | ICD-10-CM

## 2023-09-26 DIAGNOSIS — Z3689 Encounter for other specified antenatal screening: Secondary | ICD-10-CM | POA: Insufficient documentation

## 2023-09-26 DIAGNOSIS — Z363 Encounter for antenatal screening for malformations: Secondary | ICD-10-CM | POA: Insufficient documentation

## 2023-09-26 DIAGNOSIS — Z362 Encounter for other antenatal screening follow-up: Secondary | ICD-10-CM

## 2023-09-26 DIAGNOSIS — Z3480 Encounter for supervision of other normal pregnancy, unspecified trimester: Secondary | ICD-10-CM | POA: Insufficient documentation

## 2023-09-26 DIAGNOSIS — O99340 Other mental disorders complicating pregnancy, unspecified trimester: Secondary | ICD-10-CM | POA: Insufficient documentation

## 2023-09-26 DIAGNOSIS — Z3481 Encounter for supervision of other normal pregnancy, first trimester: Secondary | ICD-10-CM | POA: Diagnosis not present

## 2023-09-26 DIAGNOSIS — O09899 Supervision of other high risk pregnancies, unspecified trimester: Secondary | ICD-10-CM | POA: Insufficient documentation

## 2023-09-26 DIAGNOSIS — F909 Attention-deficit hyperactivity disorder, unspecified type: Secondary | ICD-10-CM

## 2023-09-26 DIAGNOSIS — F419 Anxiety disorder, unspecified: Secondary | ICD-10-CM | POA: Diagnosis not present

## 2023-09-26 DIAGNOSIS — O99212 Obesity complicating pregnancy, second trimester: Secondary | ICD-10-CM | POA: Insufficient documentation

## 2023-09-26 DIAGNOSIS — O99342 Other mental disorders complicating pregnancy, second trimester: Secondary | ICD-10-CM | POA: Diagnosis not present

## 2023-09-26 NOTE — Progress Notes (Signed)
 Patient information  Patient Name: Miranda Curtis  Patient MRN:   [id number]  Referring practice: MFM Referring Provider: [facility]  Problem List   Patient Active Problem List   Diagnosis Date Noted   Obesity affecting pregnancy, antepartum 09/16/2023   Short interval between pregnancies affecting pregnancy, antepartum 08/12/2023   Maternal varicella, non-immune 08/12/2023   Supervision of other normal pregnancy, antepartum 07/29/2023   Anxiety and depression 07/29/2023   ADHD 07/29/2023    Maternal Fetal Medicine Consult NAVIE LAMOREAUX is a 23 y.o. [id number] at [redacted]w[redacted]d here for ultrasound and consultation. NIPS: Low risk. Fetal sex: Female. She has no acute concerns. 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  We discussed the potential impact in pregnancy and the need to monitor nutrient labs.  The patient has had no complications with this and she is able to tolerate some level of glucose but was not able to finish her glucose challenge test in her previous pregnancy.  She is going to do the jellybean test instead.  Short interval pregnancy: I discussed the potential pregnancy complications associated with this including preterm birth.  The cervical length appears normal.  She denies excessive pelvic pain or pressure and feels overall well.  We discussed the importance of monitoring for signs and symptoms of preterm labor.  Elevated BMI: I discussed the potential complications associated with obesity in pregnancy.  These complications include but are not limited to increased risk of excessive maternal weight gain, fetal growth abnormalities, fetal congenital disorders, inability to visualize fetal anatomic structures on ultrasound, gestational diabetes, hypertensive disorders of pregnancy, operative birth including cesarean delivery or assisted vaginal delivery, delayed wound healing and many  long-term health complications.  I discussed the need for continued growth ultrasounds and possibly antenatal testing depending upon how the pregnancy course progresses.  Maternal weight gain should be limited to 10 to 20 pounds during the pregnancy.  While normal weight loss may occur during the first and early second trimester, efforts to actively lose weight with the use of medication is not recommended during pregnancy.  A whole food diet and regular exercise of at least 15 to 30 minutes of moderately strenuous activity is recommended in the absence of any contraindications. Weight loss with the use of medications is not recommended during pregnancy. I discussed the risk and impact of preeclampsia on her pregnancy and the role of baseline laboratory assessment of kidney, liver and platelet count as well as the role of low dose Aspirin  to the reduce the risk of developing preeclampsia. I reassured the patient that we expect a favorable pregnancy outcome but due to her pregnancy complications she will need a higher level of monitoring for her pregnancy compared to a pregnancy without complications. The patient had time to ask questions that were answered to her satisfaction. She verbalized understanding of our discussion and agreed to proceed with the plan outlined in the recommendations.   Sonographic findings Single intrauterine pregnancy at 19w 1d. Fetal cardiac activity:  Observed and appears normal. Presentation: Cephalic. The anatomic structures that were well seen appear normal without evidence of soft markers. The anatomic survey is complete.  Fetal biometry shows the estimated fetal weight at the 72 percentile. Amniotic fluid: Within normal limits.  MVP: 5.2 cm. Placenta: Fundal. Adnexa: No abnormality visualized. Cervical length: 4.5 cm.  There are limitations of prenatal ultrasound such as the inability to detect certain abnormalities  due to poor visualization. Various factors such as fetal  position, gestational age and maternal body habitus may increase the difficulty in visualizing the fetal anatomy.    Recommendations - EDD should be 02/19/2024 based on  Early Ultrasound  (06/24/23). - Anatomy ultrasound was done today with the above findings (see report). - Aspirin  81-162 mg from 12 weeks and continued throughout the pregnancy for preeclampsia prophylaxis. - Baseline labs: CMP, CBC, urine protein creatinine ratio. - Blood pressure goal of < 140 systolic and < 90 diastolic. Antihypertensive medication should be added/adjusted until BP goal is achieved.  - Assess nutrient labs for bypass surgery every trimester including: Folate, B12, ferritin, vitamin D . - Serial growth ultrasounds every 4-6 weeks starting at 28 weeks until delivery. - Delivery likely around [redacted] weeks gestation or sooner if indicated.  Review of Systems: A review of systems was performed and was negative except per HPI   Past Obstetrical History:  OB History  Gravida Para Term Preterm AB Living  6 2 2  3 2   SAB IAB Ectopic Multiple Live Births  2 1  0 2    # Outcome Date GA Lbr Len/2nd Weight Sex Type Anes PTL Lv  6 Current           5 IAB 02/2023          4 Term 10/25/22 [redacted]w[redacted]d 03:29 / 00:09 6 lb 10.2 oz (3.01 kg) M Vag-Spont EPI  LIV  3 SAB 04/10/19 [redacted]w[redacted]d         2 Term 11/08/17 [redacted]w[redacted]d 21:32 / 00:51 6 lb 11.9 oz (3.06 kg) F Vag-Spont EPI N LIV  1 SAB 10/07/16 [redacted]w[redacted]d            Past Medical History:  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-has resolved following surgery   Eczema    History of depression 11/18/2017   History of diabetes mellitus 04/11/2022   Maternal varicella, non-immune 05/09/2017   Missed abortion 04/07/2019   Vitamin B 12 deficiency 10/26/2017   05/09/22: b12 injection     Vitamin D   deficiency 05/09/2022     Past Surgical History:    Past Surgical History:  Procedure Laterality Date   CHOLECYSTECTOMY N/A 03/13/2023   Procedure: LAPAROSCOPIC CHOLECYSTECTOMY WITH ICG DYE;  Surgeon: Edmon Gosling, MD;  Location: [facility];  Service: General;  Laterality: N/A;   DILATION AND EVACUATION N/A 02/24/2023   Procedure: DILATATION AND EVACUATION;  Surgeon: Granville Layer, MD;  Location: [facility] OR;  Service: Gynecology;  Laterality: N/A;   SLEEVE GASTROPLASTY  10/12/2021   UPPER GASTROINTESTINAL ENDOSCOPY N/A      Home Medications:   Current Outpatient Medications on File Prior to Visit  Medication Sig Dispense Refill   aspirin  EC 81 MG tablet Take 1 tablet (81 mg total) by mouth daily. Swallow whole. 100 tablet 12   ondansetron  (ZOFRAN -ODT) 4 MG disintegrating tablet Take 1 tablet (4 mg total) by mouth every 8 (eight) hours as needed. 60 tablet 0   butalbital -acetaminophen -caffeine  (FIORICET ) 50-325-40 MG tablet Take 2 tablets by mouth 2 (two) times daily as needed for headache. (Patient not taking: Reported on 09/11/2023) 30 tablet 0   fluconazole  (DIFLUCAN ) 150 MG tablet Take 1 tablet (150 mg total) by mouth every other day as needed. (Patient not taking:  Reported on 09/11/2023) 2 tablet 0   fluconazole  (DIFLUCAN ) 150 MG tablet Take 1 tablet (150 mg total) by mouth every other day as needed. (Patient not taking: Reported on 09/26/2023) 2 tablet 2   promethazine  (PHENERGAN ) 25 MG tablet Take 1 tablet (25 mg total) by mouth every 6 (six) hours as needed. Can insert vaginally, if unable to keep anything down (Patient not taking: Reported on 09/11/2023) 30 tablet 0   No current facility-administered medications on file prior to visit.      Allergies:   Allergies  Allergen Reactions   Reglan  [Metoclopramide ] Anxiety and Other (See Comments)    Severe panic attack     Physical Exam:   Vitals:   09/26/23 0914  BP: (!) 112/57  Pulse: (!) 101   Sitting comfortably on the sonogram  table Nonlabored breathing Normal rate and rhythm Abdomen is nontender  Thank you for the opportunity to be involved with this patient's care. Please let us  know if we can be of any further assistance.   30 minutes of time was spent reviewing the patient's chart including labs, imaging and documentation.  At least 50% of this time was spent with direct patient care discussing the diagnosis, management and prognosis of her care.  Dannis Dy MFM, [place]   09/26/2023  10:10 AM

## 2023-09-27 ENCOUNTER — Ambulatory Visit (HOSPITAL_BASED_OUTPATIENT_CLINIC_OR_DEPARTMENT_OTHER): Payer: Self-pay | Admitting: Obstetrics & Gynecology

## 2023-09-27 NOTE — Progress Notes (Signed)
 Called patient. Verified DOB. Informed patient of the below information. Patient expressed understanding and will set up an appointment. tbw

## 2023-10-01 ENCOUNTER — Encounter (HOSPITAL_BASED_OUTPATIENT_CLINIC_OR_DEPARTMENT_OTHER): Payer: Self-pay | Admitting: Certified Nurse Midwife

## 2023-10-03 ENCOUNTER — Other Ambulatory Visit: Payer: Self-pay

## 2023-10-03 ENCOUNTER — Inpatient Hospital Stay (HOSPITAL_COMMUNITY)
Admission: AD | Admit: 2023-10-03 | Discharge: 2023-10-03 | Disposition: A | Discharge status: Home / Self Care | Payer: MEDICAID | Attending: Family Medicine | Admitting: Family Medicine

## 2023-10-03 ENCOUNTER — Encounter (HOSPITAL_COMMUNITY): Payer: Self-pay | Admitting: Family Medicine

## 2023-10-03 ENCOUNTER — Other Ambulatory Visit: Payer: Self-pay | Admitting: Obstetrics and Gynecology

## 2023-10-03 DIAGNOSIS — D509 Iron deficiency anemia, unspecified: Secondary | ICD-10-CM | POA: Insufficient documentation

## 2023-10-03 DIAGNOSIS — R102 Pelvic and perineal pain: Secondary | ICD-10-CM | POA: Insufficient documentation

## 2023-10-03 DIAGNOSIS — M549 Dorsalgia, unspecified: Secondary | ICD-10-CM | POA: Diagnosis not present

## 2023-10-03 DIAGNOSIS — R55 Syncope and collapse: Secondary | ICD-10-CM | POA: Insufficient documentation

## 2023-10-03 DIAGNOSIS — Z3A20 20 weeks gestation of pregnancy: Secondary | ICD-10-CM | POA: Diagnosis not present

## 2023-10-03 DIAGNOSIS — O99012 Anemia complicating pregnancy, second trimester: Secondary | ICD-10-CM | POA: Diagnosis not present

## 2023-10-03 DIAGNOSIS — O99019 Anemia complicating pregnancy, unspecified trimester: Secondary | ICD-10-CM | POA: Insufficient documentation

## 2023-10-03 DIAGNOSIS — O26892 Other specified pregnancy related conditions, second trimester: Secondary | ICD-10-CM | POA: Insufficient documentation

## 2023-10-03 LAB — URINALYSIS, ROUTINE W REFLEX MICROSCOPIC
Bilirubin Urine: NEGATIVE
Glucose, UA: NEGATIVE mg/dL
Hgb urine dipstick: NEGATIVE
Ketones, ur: NEGATIVE mg/dL
Nitrite: NEGATIVE
Protein, ur: 30 mg/dL — AB
Specific Gravity, Urine: 1.012 (ref 1.005–1.030)
pH: 6 (ref 5.0–8.0)

## 2023-10-03 LAB — COMPREHENSIVE METABOLIC PANEL WITH GFR
ALT: 11 U/L (ref 0–44)
AST: 15 U/L (ref 15–41)
Albumin: 3.2 g/dL — ABNORMAL LOW (ref 3.5–5.0)
Alkaline Phosphatase: 32 U/L — ABNORMAL LOW (ref 38–126)
Anion gap: 10 (ref 5–15)
BUN: <5 mg/dL — ABNORMAL LOW (ref 6–20)
CO2: 15 mmol/L — ABNORMAL LOW (ref 22–32)
Calcium: 8.7 mg/dL — ABNORMAL LOW (ref 8.9–10.3)
Chloride: 104 mmol/L (ref 98–111)
Creatinine, Ser: 0.57 mg/dL (ref 0.44–1.00)
GFR, Estimated: >60 mL/min (ref >60)
Glucose, Bld: 98 mg/dL (ref 70–99)
Potassium: 3.8 mmol/L (ref 3.5–5.1)
Sodium: 129 mmol/L — ABNORMAL LOW (ref 135–145)
Total Bilirubin: 0.8 mg/dL (ref 0.0–1.2)
Total Protein: 7 g/dL (ref 6.5–8.1)

## 2023-10-03 LAB — CBC
HCT: 27.3 % — ABNORMAL LOW (ref 36.0–46.0)
Hemoglobin: 8.4 g/dL — ABNORMAL LOW (ref 12.0–15.0)
MCH: 19.2 pg — ABNORMAL LOW (ref 26.0–34.0)
MCHC: 30.8 g/dL (ref 30.0–36.0)
MCV: 62.5 fL — ABNORMAL LOW (ref 80.0–100.0)
Platelets: 314 10*3/uL (ref 150–400)
RBC: 4.37 MIL/uL (ref 3.87–5.11)
RDW: 17.9 % — ABNORMAL HIGH (ref 11.5–15.5)
WBC: 8.4 10*3/uL (ref 4.0–10.5)
nRBC: 0 % (ref 0.0–0.2)

## 2023-10-03 MED ORDER — CYCLOBENZAPRINE HCL 10 MG PO TABS: 10.0000 mg | ORAL_TABLET | Freq: Two times a day (BID) | ORAL | 0 refills | Status: DC | PRN

## 2023-10-03 MED ORDER — ACETAMINOPHEN 500 MG PO TABS
1000.0000 mg | ORAL_TABLET | Freq: Once | ORAL | Status: AC
  Administered 2023-10-03: 1000 mg via ORAL
  Filled 2023-10-03: qty 2

## 2023-10-03 MED ORDER — CYCLOBENZAPRINE HCL 5 MG PO TABS
10.0000 mg | ORAL_TABLET | Freq: Once | ORAL | Status: AC
Start: 2023-10-03 — End: 2023-10-03
  Administered 2023-10-03: 10 mg via ORAL
  Filled 2023-10-03: qty 2

## 2023-10-03 NOTE — Discharge Instructions (Signed)
   PREGNANCY SUPPORT BELT: You are not alone, Seventy-five percent of women have some sort of abdominal or back pain at some point in their pregnancy. Your baby is growing at a fast pace, which means that your whole body is rapidly trying to adjust to the changes. As your uterus grows, your back may start feeling a bit under stress and this can result in back or abdominal pain that can go from mild, and therefore bearable, to severe pains that will not allow you to sit or lay down comfortably, When it comes to dealing with pregnancy-related pains and cramps, some pregnant women usually prefer natural remedies, which the market is filled with nowadays. For example, wearing a pregnancy support belt can help ease and lessen your discomfort and pain.  WHAT ARE THE BENEFITS OF WEARING A PREGNANCY SUPPORT BELT? A pregnancy support belt provides support to the lower portion of the belly taking some of the weight of the growing uterus and distributing to the other parts of your body. It is designed make you comfortable and gives you extra support. Over the years, the pregnancy apparel market has been studying the needs and wants of pregnant women and they have come up with the most comfortable pregnancy support belts that woman could ever ask for. In fact, you will no longer have to wear a stretched-out or bulky pregnancy belt that is visible underneath your clothes and makes you feel even more uncomfortable. Nowadays, a pregnancy support belt is made of comfortable and stretchy materials that will not irritate your skin but will actually make you feel at ease and you will not even notice you are wearing it. They are easy to put on and adjust during the day and can be worn at night for additional support.  BENEFITS: Relives Back pain Relieves Abdominal Muscle and Leg Pain Stabilizes the Pelvic Ring Offers a Cushioned Abdominal Lift Pad Relieves pressure on the Sciatic Nerve Within Minutes WHERE TO GET YOUR  Pregnancy Belt/Maternity Belt/Belly Band:   [employer]

## 2023-10-03 NOTE — MAU Provider Note (Signed)
 History     CSN: [id number]  Arrival date and time: 10/03/23 1431   Event Date/Time   First Provider Initiated Contact with Patient 10/03/23 1538      Chief Complaint  Patient presents with   Pelvic Pressure   Syncope   HPI [name] is a 23 y.o. year old [id number] female at [redacted]w[redacted]d weeks gestation who presents to MAU via EMS reporting she passed out at school after getting up to go to the BR at about 1415-1420. She reports she did not hit the floor, because a classmate caught her and helped guide her to the floor. She denies VB or LOF. She reports no FM this morning. She also reports pelvic pressure and a constant, aching, sharp lower back pain. She rates the pelvic pressure 6/10 and the back pain 7/10. She reports she has been fatigued for the past 2 weeks, but thinks it is from studying for school. She receives [facility] with [facility]; next appt is 10/09/2023.    OB History     Gravida  6   Para  2   Term  2   Preterm      AB  3   Living  2      SAB  2   IAB  1   Ectopic      Multiple  0   Live Births  2           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-has resolved following surgery   Eczema    History of depression 11/18/2017   History of diabetes mellitus 04/11/2022   Maternal varicella, non-immune 05/09/2017   Missed abortion 04/07/2019   Vitamin B 12 deficiency 10/26/2017   05/09/22: b12 injection     Vitamin D  deficiency 05/09/2022    Past Surgical History:  Procedure Laterality Date   CHOLECYSTECTOMY N/A 03/13/2023   Procedure: LAPAROSCOPIC CHOLECYSTECTOMY WITH ICG DYE;  Surgeon: [name], MD;  Location: [facility];  Service: General;  Laterality: N/A;   DILATION AND EVACUATION N/A 02/24/2023   Procedure: DILATATION AND EVACUATION;  Surgeon: [name],  MD;  Location: [facility] OR;  Service: Gynecology;  Laterality: N/A;   SLEEVE GASTROPLASTY  10/12/2021   UPPER GASTROINTESTINAL ENDOSCOPY N/A     Family History  Problem Relation Age of Onset   Diabetes Mother    Healthy Mother    Sickle cell trait Mother    Sickle cell trait Father    Rashes / Skin problems Father        eczema   Asthma Sister    Rashes / Skin problems Sister        eczema   Rashes / Skin problems Sister        eczema   Rashes / Skin problems Sister        eczema   Sickle cell anemia Sister    Rashes / Skin problems Sister        eczema   Autism Brother    Healthy Brother    Autism Daughter    Diabetes Maternal Grandmother    Hypertension Maternal Grandmother    Cancer Maternal Grandfather        prostate   Cancer Paternal Grandmother 20       lung   Healthy  Paternal Grandfather    Ovarian cancer Neg Hx    Colon cancer Neg Hx    Breast cancer Neg Hx     Social History   Tobacco Use   Smoking status: Never   Smokeless tobacco: Never  Vaping Use   Vaping status: Never Used  Substance Use Topics   Alcohol use: No   Drug use: No    Allergies:  Allergies  Allergen Reactions   Reglan  [Metoclopramide ] Anxiety and Other (See Comments)    Severe panic attack    Medications Prior to Admission  Medication Sig Dispense Refill Last Dose/Taking   ondansetron  (ZOFRAN -ODT) 4 MG disintegrating tablet Take 1 tablet (4 mg total) by mouth every 8 (eight) hours as needed. 60 tablet 0 Past Month   aspirin  EC 81 MG tablet Take 1 tablet (81 mg total) by mouth daily. Swallow whole. 100 tablet 12 10/01/2023   butalbital -acetaminophen -caffeine  (FIORICET ) 50-325-40 MG tablet Take 2 tablets by mouth 2 (two) times daily as needed for headache. (Patient not taking: Reported on 09/11/2023) 30 tablet 0    fluconazole  (DIFLUCAN ) 150 MG tablet Take 1 tablet (150 mg total) by mouth every other day as needed. (Patient not taking: Reported on 09/11/2023) 2 tablet 0    fluconazole   (DIFLUCAN ) 150 MG tablet Take 1 tablet (150 mg total) by mouth every other day as needed. (Patient not taking: Reported on 09/26/2023) 2 tablet 2    promethazine  (PHENERGAN ) 25 MG tablet Take 1 tablet (25 mg total) by mouth every 6 (six) hours as needed. Can insert vaginally, if unable to keep anything down (Patient not taking: Reported on 09/11/2023) 30 tablet 0     Review of Systems  Constitutional:  Positive for fatigue (x 2 wks).  HENT: Negative.    Eyes: Negative.   Respiratory: Negative.    Cardiovascular: Negative.   Gastrointestinal: Negative.   Endocrine: Negative.   Genitourinary:  Positive for pelvic pain.  Musculoskeletal:  Positive for back pain.  Allergic/Immunologic: Negative.   Neurological:  Positive for weakness.  Hematological: Negative.   Psychiatric/Behavioral: Negative.     Physical Exam   Blood pressure 114/69, pulse (!) 106, temperature (!) 97.4 F (36.3 C), temperature source Oral, SpO2 100%, not currently breastfeeding.  Physical Exam Vitals and nursing note reviewed.  Constitutional:      Appearance: Normal appearance. She is obese.   Neurological:     Mental Status: She is alert.    FHTs by doppler: 156 bpm  MAU Course  Procedures  MDM Flexeril  10 mg po -- pain unrelieved per pt Tylenol  1000 mg po -- pain unrelieved per pt  Results for orders placed or performed during the hospital encounter of 10/03/23 (from the past 24 hours)  Urinalysis, Routine w reflex microscopic -Urine, Clean Catch     Status: Abnormal   Collection Time: 10/03/23  3:12 PM  Result Value Ref Range   Color, Urine YELLOW YELLOW   APPearance CLOUDY (A) CLEAR   Specific Gravity, Urine 1.012 1.005 - 1.030   pH 6.0 5.0 - 8.0   Glucose, UA NEGATIVE NEGATIVE mg/dL   Hgb urine dipstick NEGATIVE NEGATIVE   Bilirubin Urine NEGATIVE NEGATIVE   Ketones, ur NEGATIVE NEGATIVE mg/dL   Protein, ur 30 (A) NEGATIVE mg/dL   Nitrite NEGATIVE NEGATIVE   Leukocytes,Ua MODERATE (A)  NEGATIVE   RBC / HPF 0-5 0 - 5 RBC/hpf   WBC, UA 11-20 0 - 5 WBC/hpf   Bacteria, UA RARE (A) NONE SEEN  Squamous Epithelial / HPF 21-50 0 - 5 /HPF   Mucus PRESENT   CBC     Status: Abnormal   Collection Time: 10/03/23  4:38 PM  Result Value Ref Range   WBC 8.4 4.0 - 10.5 K/uL   RBC 4.37 3.87 - 5.11 MIL/uL   Hemoglobin 8.4 (L) 12.0 - 15.0 g/dL   HCT 72.6 (L) 63.9 - 53.9 %   MCV 62.5 (L) 80.0 - 100.0 fL   MCH 19.2 (L) 26.0 - 34.0 pg   MCHC 30.8 30.0 - 36.0 g/dL   RDW 82.0 (H) 88.4 - 84.4 %   Platelets 314 150 - 400 K/uL   nRBC 0.0 0.0 - 0.2 %  Comprehensive metabolic panel with GFR     Status: Abnormal   Collection Time: 10/03/23  4:38 PM  Result Value Ref Range   Sodium 129 (L) 135 - 145 mmol/L   Potassium 3.8 3.5 - 5.1 mmol/L   Chloride 104 98 - 111 mmol/L   CO2 15 (L) 22 - 32 mmol/L   Glucose, Bld 98 70 - 99 mg/dL   BUN <5 (L) 6 - 20 mg/dL   Creatinine, Ser 9.42 0.44 - 1.00 mg/dL   Calcium 8.7 (L) 8.9 - 10.3 mg/dL   Total Protein 7.0 6.5 - 8.1 g/dL   Albumin 3.2 (L) 3.5 - 5.0 g/dL   AST 15 15 - 41 U/L   ALT 11 0 - 44 U/L   Alkaline Phosphatase 32 (L) 38 - 126 U/L   Total Bilirubin 0.8 0.0 - 1.2 mg/dL   GFR, Estimated >39 >39 mL/min   Anion gap 10 5 - 15    *Consult with Dr. Alger @ 1840 - notified of patient's complaints, assessments, lab results, tx plan Venofer infusion if not contraindicated by gastric sleeve procedure - ok to d/c home, agrees with plan for Venofer infusion  Assessment and Plan  1. Pelvic pressure in pregnancy, antepartum, second trimester (Primary) - Information provided on abdominal pain in pregnancy - Advised to purchase maternity support belt from [employer]   2. Back pain affecting pregnancy in second trimester - Information provided on back pain in pregnancy - Prescription for: Flexeril  10 mg po BID prn   3. Iron deficiency anemia during pregnancy - Information provided on anemia during pregnancy  and preventing iron deficiency anemia -  Outpatient Venofer infusions   4. [redacted] weeks gestation of pregnancy   - Discharge home - Keep scheduled appt with [facility] on 10/09/2023 - Patient verbalized an understanding of the plan of care and agrees.   Armondo Cech, CNM 10/03/2023, 4:00 PM

## 2023-10-03 NOTE — MAU Note (Signed)
 Miranda Curtis is a 23 y.o. at [redacted]w[redacted]d here via EMS in MAU reporting: she passed out at approximately 1415 and 1420.  Reports she was caught by a classmate and didn't actually fall onto the floor.  Denies VB or LOF.  Endorses pelvic pressure and constant, aching, and sharp lower back pain.  Reports hasn't had FM since this morning.  LMP: NA Onset of complaint: today Pain score: 6 pelvis &  7 lower back    FHT: 156 bpm  Lab orders placed from triage: UA

## 2023-10-03 NOTE — Progress Notes (Signed)
 Venofer infusion order plan entered

## 2023-10-04 ENCOUNTER — Inpatient Hospital Stay (HOSPITAL_COMMUNITY)
Admission: AD | Admit: 2023-10-04 | Discharge: 2023-10-05 | Disposition: A | Discharge status: Home / Self Care | Payer: MEDICAID | Source: Home / Self Care | Attending: Family Medicine | Admitting: Family Medicine

## 2023-10-04 ENCOUNTER — Encounter (HOSPITAL_BASED_OUTPATIENT_CLINIC_OR_DEPARTMENT_OTHER): Payer: Self-pay | Admitting: Certified Nurse Midwife

## 2023-10-04 ENCOUNTER — Telehealth: Payer: Self-pay

## 2023-10-04 ENCOUNTER — Encounter (HOSPITAL_COMMUNITY): Payer: Self-pay | Admitting: Family Medicine

## 2023-10-04 ENCOUNTER — Telehealth: Payer: Self-pay | Admitting: Obstetrics and Gynecology

## 2023-10-04 ENCOUNTER — Other Ambulatory Visit: Payer: Self-pay | Admitting: Obstetrics and Gynecology

## 2023-10-04 DIAGNOSIS — Z3A20 20 weeks gestation of pregnancy: Secondary | ICD-10-CM | POA: Insufficient documentation

## 2023-10-04 DIAGNOSIS — F419 Anxiety disorder, unspecified: Secondary | ICD-10-CM | POA: Insufficient documentation

## 2023-10-04 DIAGNOSIS — Z3480 Encounter for supervision of other normal pregnancy, unspecified trimester: Secondary | ICD-10-CM

## 2023-10-04 DIAGNOSIS — O99842 Bariatric surgery status complicating pregnancy, second trimester: Secondary | ICD-10-CM | POA: Insufficient documentation

## 2023-10-04 DIAGNOSIS — Z7982 Long term (current) use of aspirin: Secondary | ICD-10-CM | POA: Diagnosis not present

## 2023-10-04 DIAGNOSIS — F32A Anxiety disorder, unspecified: Secondary | ICD-10-CM

## 2023-10-04 DIAGNOSIS — O99891 Other specified diseases and conditions complicating pregnancy: Secondary | ICD-10-CM | POA: Insufficient documentation

## 2023-10-04 DIAGNOSIS — R55 Syncope and collapse: Secondary | ICD-10-CM | POA: Diagnosis not present

## 2023-10-04 DIAGNOSIS — O99212 Obesity complicating pregnancy, second trimester: Secondary | ICD-10-CM | POA: Diagnosis not present

## 2023-10-04 DIAGNOSIS — R000 Tachycardia, unspecified: Secondary | ICD-10-CM | POA: Insufficient documentation

## 2023-10-04 DIAGNOSIS — O99342 Other mental disorders complicating pregnancy, second trimester: Secondary | ICD-10-CM | POA: Diagnosis not present

## 2023-10-04 DIAGNOSIS — O99012 Anemia complicating pregnancy, second trimester: Secondary | ICD-10-CM | POA: Insufficient documentation

## 2023-10-04 DIAGNOSIS — O26822 Pregnancy related peripheral neuritis, second trimester: Secondary | ICD-10-CM | POA: Diagnosis not present

## 2023-10-04 DIAGNOSIS — D509 Iron deficiency anemia, unspecified: Secondary | ICD-10-CM | POA: Diagnosis not present

## 2023-10-04 DIAGNOSIS — F909 Attention-deficit hyperactivity disorder, unspecified type: Secondary | ICD-10-CM | POA: Diagnosis not present

## 2023-10-04 DIAGNOSIS — O26812 Pregnancy related exhaustion and fatigue, second trimester: Secondary | ICD-10-CM | POA: Diagnosis present

## 2023-10-04 DIAGNOSIS — E669 Obesity, unspecified: Secondary | ICD-10-CM | POA: Diagnosis not present

## 2023-10-04 DIAGNOSIS — Z363 Encounter for antenatal screening for malformations: Secondary | ICD-10-CM | POA: Diagnosis not present

## 2023-10-04 LAB — RETICULOCYTES
Immature Retic Fract: 30 % — ABNORMAL HIGH (ref 2.3–15.9)
RBC.: 4.39 MIL/uL (ref 3.87–5.11)
Retic Count, Absolute: 68.9 10*3/uL (ref 19.0–186.0)
Retic Ct Pct: 1.6 % (ref 0.4–3.1)

## 2023-10-04 LAB — CBC
HCT: 28.3 % — ABNORMAL LOW (ref 36.0–46.0)
Hemoglobin: 8.6 g/dL — ABNORMAL LOW (ref 12.0–15.0)
MCH: 19.1 pg — ABNORMAL LOW (ref 26.0–34.0)
MCHC: 30.4 g/dL (ref 30.0–36.0)
MCV: 62.9 fL — ABNORMAL LOW (ref 80.0–100.0)
Platelets: 316 10*3/uL (ref 150–400)
RBC: 4.5 MIL/uL (ref 3.87–5.11)
RDW: 18.2 % — ABNORMAL HIGH (ref 11.5–15.5)
WBC: 8.7 10*3/uL (ref 4.0–10.5)
nRBC: 0 % (ref 0.0–0.2)

## 2023-10-04 LAB — URINALYSIS, ROUTINE W REFLEX MICROSCOPIC
Bilirubin Urine: NEGATIVE
Glucose, UA: NEGATIVE mg/dL
Hgb urine dipstick: NEGATIVE
Ketones, ur: NEGATIVE mg/dL
Nitrite: NEGATIVE
Protein, ur: NEGATIVE mg/dL
Specific Gravity, Urine: 1.018 (ref 1.005–1.030)
pH: 8 (ref 5.0–8.0)

## 2023-10-04 LAB — COMPREHENSIVE METABOLIC PANEL WITH GFR
ALT: 12 U/L (ref 0–44)
AST: 17 U/L (ref 15–41)
Albumin: 3.3 g/dL — ABNORMAL LOW (ref 3.5–5.0)
Alkaline Phosphatase: 30 U/L — ABNORMAL LOW (ref 38–126)
Anion gap: 9 (ref 5–15)
BUN: 6 mg/dL (ref 6–20)
CO2: 18 mmol/L — ABNORMAL LOW (ref 22–32)
Calcium: 8.9 mg/dL (ref 8.9–10.3)
Chloride: 106 mmol/L (ref 98–111)
Creatinine, Ser: 0.56 mg/dL (ref 0.44–1.00)
GFR, Estimated: >60 mL/min (ref >60)
Glucose, Bld: 105 mg/dL — ABNORMAL HIGH (ref 70–99)
Potassium: 3.6 mmol/L (ref 3.5–5.1)
Sodium: 133 mmol/L — ABNORMAL LOW (ref 135–145)
Total Bilirubin: 0.7 mg/dL (ref 0.0–1.2)
Total Protein: 7.1 g/dL (ref 6.5–8.1)

## 2023-10-04 LAB — WET PREP, GENITAL
Clue Cells Wet Prep HPF POC: NONE SEEN
Sperm: NONE SEEN
Trich, Wet Prep: NONE SEEN
WBC, Wet Prep HPF POC: >=10 — AB (ref <10)
Yeast Wet Prep HPF POC: NONE SEEN

## 2023-10-04 LAB — IRON AND TIBC
Iron: 19 ug/dL — ABNORMAL LOW (ref 28–170)
Saturation Ratios: 3 % — ABNORMAL LOW (ref 10.4–31.8)
TIBC: 622 ug/dL — ABNORMAL HIGH (ref 250–450)
UIBC: 603 ug/dL

## 2023-10-04 LAB — FERRITIN: Ferritin: 3 ng/mL — ABNORMAL LOW (ref 11–307)

## 2023-10-04 LAB — VITAMIN B12: Vitamin B-12: 156 pg/mL — ABNORMAL LOW (ref 180–914)

## 2023-10-04 LAB — FOLATE: Folate: 9.6 ng/mL (ref >5.9)

## 2023-10-04 MED ORDER — ACETAMINOPHEN 500 MG PO TABS
1000.0000 mg | ORAL_TABLET | Freq: Once | ORAL | Status: AC
  Administered 2023-10-04: 1000 mg via ORAL
  Filled 2023-10-04: qty 2

## 2023-10-04 MED ORDER — LACTATED RINGERS IV BOLUS
1000.0000 mL | Freq: Once | INTRAVENOUS | Status: AC
  Administered 2023-10-04: 1000 mL via INTRAVENOUS

## 2023-10-04 NOTE — MAU Provider Note (Cosign Needed)
 Chief Complaint:  Loss of Consciousness   HPI   None     Miranda Curtis is a 23 y.o. [phone] at [redacted]w[redacted]d who presents to maternity admissions reporting syncopal episode.  She reports that earlier today, she felt very dizzy and hot.  Her grandmother was with her and reports that she passed out twice.  Patient does not remember anything until she heard her grandmother calling EMS.  She reports at the same thing happened yesterday at school, so she was evaluated in the MAU and found to be anemic with hemoglobin 8.4.  Referral was placed for IV iron infusions, offered to schedule for next available appointment which is in about 2 weeks.  Patient has a history of gastric bypass, she is not currently taking any supplements for iron, B12, or vitamin D .  Additional history obtained from friend  Pregnancy Course: Receives care at [facility]. Prenatal records reviewed.   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-has resolved following surgery   Eczema    History of depression 11/18/2017   History of diabetes mellitus 04/11/2022   Maternal varicella, non-immune 05/09/2017   Missed abortion 04/07/2019   Vitamin B 12 deficiency 10/26/2017   05/09/22: b12 injection     Vitamin D  deficiency 05/09/2022   OB History  Gravida Para Term Preterm AB Living  6 2 2  3 2   SAB IAB Ectopic Multiple Live Births  2 1  0 2    # Outcome Date GA Lbr Len/2nd Weight Sex Type Anes PTL Lv  6 Current           5 IAB 02/2023          4 Term 10/25/22 [redacted]w[redacted]d 03:29 / 00:09 3010 g M Vag-Spont EPI  LIV  3 SAB 04/10/19 [redacted]w[redacted]d         2 Term 11/08/17 [redacted]w[redacted]d 21:32 / 00:51 3060 g F Vag-Spont EPI N LIV  1 SAB 10/07/16 [redacted]w[redacted]d          Past Surgical History:  Procedure Laterality Date   CHOLECYSTECTOMY N/A 03/13/2023   Procedure: LAPAROSCOPIC  CHOLECYSTECTOMY WITH ICG DYE;  Surgeon: [name], MD;  Location: [facility] ORS;  Service: General;  Laterality: N/A;   DILATION AND EVACUATION N/A 02/24/2023   Procedure: DILATATION AND EVACUATION;  Surgeon: [name], MD;  Location: [facility] OR;  Service: Gynecology;  Laterality: N/A;   SLEEVE GASTROPLASTY  10/12/2021   UPPER GASTROINTESTINAL ENDOSCOPY N/A    Family History  Problem Relation Age of Onset   Diabetes Mother    Healthy Mother    Sickle cell trait Mother    Sickle cell trait Father    Rashes / Skin problems Father        eczema   Asthma Sister    Rashes / Skin problems Sister        eczema   Rashes / Skin problems Sister        eczema   Rashes / Skin problems Sister        eczema   Sickle cell anemia Sister    Rashes / Skin problems Sister        eczema   Autism Brother    Healthy Brother    Autism Daughter    Diabetes Maternal Grandmother    Hypertension  Maternal Grandmother    Cancer Maternal Grandfather        prostate   Cancer Paternal Grandmother 31       lung   Healthy Paternal Grandfather    Ovarian cancer Neg Hx    Colon cancer Neg Hx    Breast cancer Neg Hx    Social History   Tobacco Use   Smoking status: Never   Smokeless tobacco: Never  Vaping Use   Vaping status: Never Used  Substance Use Topics   Alcohol use: No   Drug use: No   Allergies  Allergen Reactions   Reglan  [Metoclopramide ] Anxiety and Other (See Comments)    Severe panic attack   Medications Prior to Admission  Medication Sig Dispense Refill Last Dose/Taking   aspirin  EC 81 MG tablet Take 1 tablet (81 mg total) by mouth daily. Swallow whole. 100 tablet 12 10/04/2023   butalbital -acetaminophen -caffeine  (FIORICET ) 50-325-40 MG tablet Take 2 tablets by mouth 2 (two) times daily as needed for headache. (Patient not taking: Reported on 09/11/2023) 30 tablet 0    cyclobenzaprine  (FLEXERIL ) 10 MG tablet Take 1 tablet (10 mg total) by mouth 2 (two) times daily as needed. 20 tablet  0    ondansetron  (ZOFRAN -ODT) 4 MG disintegrating tablet Take 1 tablet (4 mg total) by mouth every 8 (eight) hours as needed. 60 tablet 0    promethazine  (PHENERGAN ) 25 MG tablet Take 1 tablet (25 mg total) by mouth every 6 (six) hours as needed. Can insert vaginally, if unable to keep anything down (Patient not taking: Reported on 09/11/2023) 30 tablet 0     I have reviewed patient's Past Medical Hx, Surgical Hx, Family Hx, Social Hx, medications and allergies.   ROS  Pertinent items noted in HPI and remainder of comprehensive ROS otherwise negative.   PHYSICAL EXAM  Patient Vitals for the past 24 hrs:  BP Temp Temp src Pulse Resp SpO2  10/04/23 2148 121/81 -- -- (!) 110 -- --  10/04/23 1940 123/65 -- -- (!) 108 -- 100 %  10/04/23 1850 114/71 -- -- 98 -- --  10/04/23 1847 114/71 97.6 F (36.4 C) Oral (!) 103 18 --    Constitutional: Well-developed, well-nourished female in no acute distress.  HEENT: atraumatic, normocephalic. Neck has normal ROM. EOM intact. Cardiovascular: normal rate & rhythm, warm and well-perfused Respiratory: normal effort, no problems with respiration noted GI: Abd soft, non-tender, non-distended MSK: Extremities nontender, no edema, normal ROM Skin: warm and dry. Acyanotic, no jaundice or pallor. Neurologic: Alert and oriented x 4. No abnormal coordination. Psychiatric: Normal mood. Speech not slurred, not rapid/pressured. Patient is cooperative. GU: no CVA tenderness  Labs: Results for orders placed or performed during the hospital encounter of 10/04/23 (from the past 24 hours)  Urinalysis, Routine w reflex microscopic -Urine, Clean Catch     Status: Abnormal   Collection Time: 10/04/23  8:36 PM  Result Value Ref Range   Color, Urine YELLOW YELLOW   APPearance CLOUDY (A) CLEAR   Specific Gravity, Urine 1.018 1.005 - 1.030   pH 8.0 5.0 - 8.0   Glucose, UA NEGATIVE NEGATIVE mg/dL   Hgb urine dipstick NEGATIVE NEGATIVE   Bilirubin Urine NEGATIVE NEGATIVE    Ketones, ur NEGATIVE NEGATIVE mg/dL   Protein, ur NEGATIVE NEGATIVE mg/dL   Nitrite NEGATIVE NEGATIVE   Leukocytes,Ua MODERATE (A) NEGATIVE   RBC / HPF 6-10 0 - 5 RBC/hpf   WBC, UA 21-50 0 - 5 WBC/hpf   Bacteria, UA RARE (  A) NONE SEEN   Squamous Epithelial / HPF 21-50 0 - 5 /HPF   Mucus PRESENT    Non Squamous Epithelial 0-5 (A) NONE SEEN  Wet prep, genital     Status: Abnormal   Collection Time: 10/04/23  8:40 PM  Result Value Ref Range   Yeast Wet Prep HPF POC NONE SEEN NONE SEEN   Trich, Wet Prep NONE SEEN NONE SEEN   Clue Cells Wet Prep HPF POC NONE SEEN NONE SEEN   WBC, Wet Prep HPF POC >=10 (A) <10   Sperm NONE SEEN   CBC     Status: Abnormal   Collection Time: 10/04/23  9:09 PM  Result Value Ref Range   WBC 8.7 4.0 - 10.5 K/uL   RBC 4.50 3.87 - 5.11 MIL/uL   Hemoglobin 8.6 (L) 12.0 - 15.0 g/dL   HCT 71.6 (L) 63.9 - 53.9 %   MCV 62.9 (L) 80.0 - 100.0 fL   MCH 19.1 (L) 26.0 - 34.0 pg   MCHC 30.4 30.0 - 36.0 g/dL   RDW 81.7 (H) 88.4 - 84.4 %   Platelets 316 150 - 400 K/uL   nRBC 0.0 0.0 - 0.2 %  Reticulocytes     Status: Abnormal   Collection Time: 10/04/23  9:09 PM  Result Value Ref Range   Retic Ct Pct 1.6 0.4 - 3.1 %   RBC. 4.39 3.87 - 5.11 MIL/uL   Retic Count, Absolute 68.9 19.0 - 186.0 K/uL   Immature Retic Fract 30.0 (H) 2.3 - 15.9 %    Imaging:  No results found.  EKG: EKG: normal EKG, normal sinus rhythm, unchanged from previous tracings. I personally reviewed or consulted with a physician to help with intrepretation.  MDM & MAU COURSE  MDM:   MAU Course: -Vital signs within normal limits other than mild tachycardia. -EKG for syncopal episodes, normal sinus rhythm, no arrhythmias. -Discussed that while gastric bypass does decrease intestinal absorption, this is not a contraindication to PO supplements. Rather, there is a need for increased PO supplementation to ensure that the smaller percentage of the supplement that is actually absorbed is  actually adequate. -Discussed outpatient IV iron infusions, advised to continue with outpatient IV iron infusions and several are typically needed. -While awaiting lab results, patient experienced episode of dizziness and provider was called to bedside. Patient stated that she was laying down and began to feel dizzy which she describes and spinning and seeing black spots, feeling like she will pass out. She sat up suddenly and began hyperventilating. Provider and nurses and bedside, able to help patient through breathing exercises. Patient does have a history of panic attacks, though it has been many years since her last on. -CBC with mild improvement of anemia, Hgb 8.6. elevated reticulocytes consistent with IDA. -Results pending: CMP, iron studies, folate, vitamin B12. -May consider consult with hospitalist or transfer to main ED. Unsure if current symptoms are due to anemia or something else.  -Care handed over to [facility] at 2200.  Differential diagnosis considered for syncope includes but is not limited to: anemia, dehydration, arrhythmia, vasovagal syncope, hypoglycemia  Orders Placed This Encounter  Procedures   Wet prep, genital   CBC   Comprehensive metabolic panel   Vitamin B12   Folate   Iron and TIBC   Ferritin   Reticulocytes   Urinalysis, Routine w reflex microscopic -Urine, Clean Catch   Orthostatic vital signs   EKG 12-Lead   Saline lock IV   Meds  ordered this encounter  Medications   lactated ringers  bolus 1,000 mL    ASSESSMENT   1. Supervision of other normal pregnancy, antepartum   2. Anxiety and depression   3. Attention deficit hyperactivity disorder (ADHD), unspecified ADHD type   4. Iron deficiency anemia during pregnancy     PLAN  Discharge home in stable condition with return precautions.       Allergies as of 10/04/2023       Reactions   Reglan  [metoclopramide ] Anxiety, Other (See Comments)   Severe panic attack         Medication List     TAKE these medications    aspirin  EC 81 MG tablet Take 1 tablet (81 mg total) by mouth daily. Swallow whole.   butalbital -acetaminophen -caffeine  50-325-40 MG tablet Commonly known as: FIORICET  Take 2 tablets by mouth 2 (two) times daily as needed for headache.   cyclobenzaprine  10 MG tablet Commonly known as: FLEXERIL  Take 1 tablet (10 mg total) by mouth 2 (two) times daily as needed.   ondansetron  4 MG disintegrating tablet Commonly known as: ZOFRAN -ODT Take 1 tablet (4 mg total) by mouth every 8 (eight) hours as needed.   promethazine  25 MG tablet Commonly known as: PHENERGAN  Take 1 tablet (25 mg total) by mouth every 6 (six) hours as needed. Can insert vaginally, if unable to keep anything down        Joesph DELENA Sear, PA     MDM:   Initial plan to discharge patient with outpatient follow up for syncopal episodes.  Anemia of pregnancy with Hgb 8.6, and outpatient IV iron infusions pending.  But after discussing plan with patient and her family, pt mother and sister report that the episodes are frightening for them because there is no warning, no dizziness or indication that she is near syncope prior to blacking out completely. They also report she is down and unresponsive for 1-2 full minutes. After regaining consciousness, she is not fully alert and has some incoherent speech.  Given these additional descriptions of the episodes, this is more than expected for pregnancy anemia alone.  Neuro consulted but unable to see the patient in MAU tonight.  Consult Dr Cleatus and recommendation to send pt to main ED for further work up.  Pt declined and preferred discharge home.  Rx sent for [employer] prescription iron, referrals made for neurology and OB cardiology, and return precautions reviewed.  Pt stable at time of discharge.    1. Supervision of other normal pregnancy, antepartum   2. Anxiety and depression   3. Attention deficit hyperactivity disorder  (ADHD), unspecified ADHD type   4. Iron deficiency anemia during pregnancy   5. Syncope, unspecified syncope type   6. [redacted] weeks gestation of pregnancy   7. Anemia affecting pregnancy in second trimester      D/C home Close outpatient follow up Return for emergencies  Olam Boards, CNM 1:32 AM

## 2023-10-04 NOTE — MAU Note (Signed)
 Miranda Curtis is a 23 y.o. at [redacted]w[redacted]d here in MAU reporting: pt was at home talking on phone. Felt very dizzy and hot. Her grandma came over and she passed out x2 according to her grandma. Pt does not ember anything until she heard her grandma calling EMS. Stated the same thing happened yesterday at school and she came to MAU to be evaluated yesterday as well. Was found to be anemic  C/o abd pain and cramping  LMP:  Onset of complaint: yesterday Pain score: 5 Vitals:   10/04/23 1847  BP: 114/71  Pulse: (!) 103  Resp: 18  Temp: 97.6 F (36.4 C)     FHT: 145  Lab orders placed from triage: u/a

## 2023-10-04 NOTE — Telephone Encounter (Signed)
 Patient called MAU with questions about timing of iron infusion Confirmed name, date of birth and last MAU encounter.   Called [facility] infusion center and [facility] infusion center - soonest available appointment is [facility] in 2 weeks.   Discussed this with patient. Pt concerned about delay in treatment given symptomatic anemia, with Hb 8.4. Discussed 24/7 availability of MAU for assessment and treatment.  Acknowledged patient frustration with lack of immediate infusion availability and symptoms.  Encouraged to call infusion center about standby list for any cancellations.  Encouraged oral iron supplementation, adequate hydration & nutrition. Slow transitions from supine > sitting > standing.   Pt expressed understanding, and gratitude for options.

## 2023-10-04 NOTE — Telephone Encounter (Signed)
 Miranda Curtis, patient will be scheduled as soon as possible.  Auth Submission: NO AUTH NEEDED Site of care: Site of care: [facility] Payer: [employer] medicaid Medication & CPT/J Code(s) submitted: Venofer (Iron Sucrose) J1756 Diagnosis Code:  Route of submission (phone, fax, portal):  Phone # Fax # Auth type: Buy/Bill PB Units/visits requested: 200mg  x 5 doses Reference number:  Approval from: 10/04/23 to 02/03/24

## 2023-10-05 ENCOUNTER — Encounter (HOSPITAL_COMMUNITY): Payer: Self-pay

## 2023-10-05 ENCOUNTER — Observation Stay (HOSPITAL_COMMUNITY): Payer: MEDICAID

## 2023-10-05 ENCOUNTER — Other Ambulatory Visit: Payer: Self-pay

## 2023-10-05 ENCOUNTER — Observation Stay (HOSPITAL_COMMUNITY)
Admission: EM | Admit: 2023-10-05 | Discharge: 2023-10-08 | Disposition: A | Discharge status: Home / Self Care | Payer: MEDICAID | Attending: Internal Medicine | Admitting: Internal Medicine

## 2023-10-05 DIAGNOSIS — Z3492 Encounter for supervision of normal pregnancy, unspecified, second trimester: Secondary | ICD-10-CM | POA: Diagnosis not present

## 2023-10-05 DIAGNOSIS — Z3A20 20 weeks gestation of pregnancy: Secondary | ICD-10-CM

## 2023-10-05 DIAGNOSIS — E669 Obesity, unspecified: Secondary | ICD-10-CM | POA: Insufficient documentation

## 2023-10-05 DIAGNOSIS — Z7982 Long term (current) use of aspirin: Secondary | ICD-10-CM | POA: Insufficient documentation

## 2023-10-05 DIAGNOSIS — O99012 Anemia complicating pregnancy, second trimester: Secondary | ICD-10-CM

## 2023-10-05 DIAGNOSIS — Z363 Encounter for antenatal screening for malformations: Secondary | ICD-10-CM | POA: Insufficient documentation

## 2023-10-05 DIAGNOSIS — O99842 Bariatric surgery status complicating pregnancy, second trimester: Secondary | ICD-10-CM | POA: Insufficient documentation

## 2023-10-05 DIAGNOSIS — E538 Deficiency of other specified B group vitamins: Secondary | ICD-10-CM | POA: Insufficient documentation

## 2023-10-05 DIAGNOSIS — D508 Other iron deficiency anemias: Secondary | ICD-10-CM

## 2023-10-05 DIAGNOSIS — O99212 Obesity complicating pregnancy, second trimester: Secondary | ICD-10-CM | POA: Insufficient documentation

## 2023-10-05 DIAGNOSIS — F32A Depression, unspecified: Secondary | ICD-10-CM | POA: Diagnosis present

## 2023-10-05 DIAGNOSIS — F419 Anxiety disorder, unspecified: Secondary | ICD-10-CM | POA: Diagnosis present

## 2023-10-05 DIAGNOSIS — O26812 Pregnancy related exhaustion and fatigue, second trimester: Principal | ICD-10-CM | POA: Insufficient documentation

## 2023-10-05 DIAGNOSIS — R55 Syncope and collapse: Secondary | ICD-10-CM

## 2023-10-05 DIAGNOSIS — Z9884 Bariatric surgery status: Secondary | ICD-10-CM

## 2023-10-05 DIAGNOSIS — D509 Iron deficiency anemia, unspecified: Principal | ICD-10-CM | POA: Diagnosis present

## 2023-10-05 DIAGNOSIS — O26822 Pregnancy related peripheral neuritis, second trimester: Secondary | ICD-10-CM | POA: Insufficient documentation

## 2023-10-05 DIAGNOSIS — F909 Attention-deficit hyperactivity disorder, unspecified type: Secondary | ICD-10-CM | POA: Diagnosis present

## 2023-10-05 DIAGNOSIS — O99342 Other mental disorders complicating pregnancy, second trimester: Secondary | ICD-10-CM | POA: Insufficient documentation

## 2023-10-05 LAB — CBC WITH DIFFERENTIAL/PLATELET
Abs Immature Granulocytes: 0.03 10*3/uL (ref 0.00–0.07)
Basophils Absolute: 0 10*3/uL (ref 0.0–0.1)
Basophils Relative: 1 %
Eosinophils Absolute: 0.2 10*3/uL (ref 0.0–0.5)
Eosinophils Relative: 2 %
HCT: 27.4 % — ABNORMAL LOW (ref 36.0–46.0)
Hemoglobin: 8.3 g/dL — ABNORMAL LOW (ref 12.0–15.0)
Immature Granulocytes: 0 %
Lymphocytes Relative: 21 %
Lymphs Abs: 1.8 10*3/uL (ref 0.7–4.0)
MCH: 19.2 pg — ABNORMAL LOW (ref 26.0–34.0)
MCHC: 30.3 g/dL (ref 30.0–36.0)
MCV: 63.4 fL — ABNORMAL LOW (ref 80.0–100.0)
Monocytes Absolute: 0.5 10*3/uL (ref 0.1–1.0)
Monocytes Relative: 6 %
Neutro Abs: 5.8 10*3/uL (ref 1.7–7.7)
Neutrophils Relative %: 70 %
Platelets: 309 10*3/uL (ref 150–400)
RBC: 4.32 MIL/uL (ref 3.87–5.11)
RDW: 18.3 % — ABNORMAL HIGH (ref 11.5–15.5)
WBC: 8.3 10*3/uL (ref 4.0–10.5)
nRBC: 0 % (ref 0.0–0.2)

## 2023-10-05 LAB — URINALYSIS, ROUTINE W REFLEX MICROSCOPIC
Bilirubin Urine: NEGATIVE
Glucose, UA: NEGATIVE mg/dL
Hgb urine dipstick: NEGATIVE
Ketones, ur: 5 mg/dL — AB
Leukocytes,Ua: NEGATIVE
Nitrite: NEGATIVE
Protein, ur: NEGATIVE mg/dL
Specific Gravity, Urine: 1.009 (ref 1.005–1.030)
pH: 7 (ref 5.0–8.0)

## 2023-10-05 LAB — COMPREHENSIVE METABOLIC PANEL WITH GFR
ALT: 11 U/L (ref 0–44)
AST: 15 U/L (ref 15–41)
Albumin: 3.2 g/dL — ABNORMAL LOW (ref 3.5–5.0)
Alkaline Phosphatase: 30 U/L — ABNORMAL LOW (ref 38–126)
Anion gap: 10 (ref 5–15)
BUN: 6 mg/dL (ref 6–20)
CO2: 20 mmol/L — ABNORMAL LOW (ref 22–32)
Calcium: 9.2 mg/dL (ref 8.9–10.3)
Chloride: 104 mmol/L (ref 98–111)
Creatinine, Ser: 0.63 mg/dL (ref 0.44–1.00)
GFR, Estimated: >60 mL/min (ref >60)
Glucose, Bld: 78 mg/dL (ref 70–99)
Potassium: 3.9 mmol/L (ref 3.5–5.1)
Sodium: 134 mmol/L — ABNORMAL LOW (ref 135–145)
Total Bilirubin: 0.7 mg/dL (ref 0.0–1.2)
Total Protein: 7 g/dL (ref 6.5–8.1)

## 2023-10-05 LAB — CBG MONITORING, ED: Glucose-Capillary: 89 mg/dL (ref 70–99)

## 2023-10-05 LAB — HCG, SERUM, QUALITATIVE: Preg, Serum: POSITIVE — AB

## 2023-10-05 MED ORDER — FERROUS GLUCONATE 324 (38 FE) MG PO TABS
324.0000 mg | ORAL_TABLET | Freq: Every day | ORAL | Status: DC
  Administered 2023-10-06 – 2023-10-08 (×3): 324 mg via ORAL
  Filled 2023-10-05 (×4): qty 1

## 2023-10-05 MED ORDER — ENOXAPARIN SODIUM 40 MG/0.4ML IJ SOSY
40.0000 mg | PREFILLED_SYRINGE | INTRAMUSCULAR | Status: DC
  Filled 2023-10-05 (×2): qty 0.4

## 2023-10-05 MED ORDER — SODIUM CHLORIDE 0.9 % IV SOLN: 25.0000 mg | Freq: Four times a day (QID) | INTRAVENOUS | Status: DC | PRN

## 2023-10-05 MED ORDER — IRON SUCROSE 500 MG IVPB - SIMPLE MED: 500.0000 mg | Freq: Once | INTRAVENOUS | Status: DC

## 2023-10-05 MED ORDER — VITAMIN B-12 100 MCG PO TABS: 100.0000 ug | ORAL_TABLET | Freq: Every day | ORAL | Status: DC

## 2023-10-05 MED ORDER — ACETAMINOPHEN 325 MG PO TABS
650.0000 mg | ORAL_TABLET | Freq: Four times a day (QID) | ORAL | Status: DC | PRN
  Administered 2023-10-05 – 2023-10-07 (×5): 650 mg via ORAL
  Filled 2023-10-05 (×5): qty 2

## 2023-10-05 MED ORDER — ACETAMINOPHEN 650 MG RE SUPP: 650.0000 mg | Freq: Four times a day (QID) | RECTAL | Status: DC | PRN

## 2023-10-05 MED ORDER — CYANOCOBALAMIN 1000 MCG/ML IJ SOLN
1000.0000 ug | Freq: Once | INTRAMUSCULAR | Status: AC
  Administered 2023-10-05: 1000 ug via INTRAMUSCULAR
  Filled 2023-10-05: qty 1

## 2023-10-05 MED ORDER — SODIUM CHLORIDE 0.9 % IV BOLUS
1000.0000 mL | Freq: Once | INTRAVENOUS | Status: AC
  Administered 2023-10-05: 1000 mL via INTRAVENOUS

## 2023-10-05 MED ORDER — IRON SUCROSE 200 MG IVPB - SIMPLE MED
200.0000 mg | Freq: Once | Status: AC
  Administered 2023-10-05: 200 mg via INTRAVENOUS
  Filled 2023-10-05: qty 200

## 2023-10-05 MED ORDER — DEXTROSE 50 % IV SOLN: 50.0000 mL | INTRAVENOUS | Status: DC | PRN

## 2023-10-05 MED ORDER — SODIUM CHLORIDE 0.9% FLUSH
3.0000 mL | Freq: Two times a day (BID) | INTRAVENOUS | Status: DC
  Administered 2023-10-05 – 2023-10-08 (×5): 3 mL via INTRAVENOUS

## 2023-10-05 MED ORDER — SENNOSIDES-DOCUSATE SODIUM 8.6-50 MG PO TABS: 1.0000 | ORAL_TABLET | Freq: Every evening | ORAL | Status: DC | PRN

## 2023-10-05 MED ORDER — ACCRUFER 30 MG PO CAPS: 1.0000 | ORAL_CAPSULE | Freq: Two times a day (BID) | ORAL | 5 refills | Status: DC

## 2023-10-05 MED ORDER — POLYETHYLENE GLYCOL 3350 17 G PO PACK
17.0000 g | PACK | Freq: Every day | ORAL | Status: DC
  Filled 2023-10-05 (×3): qty 1

## 2023-10-05 NOTE — ED Triage Notes (Signed)
 Pt bib EMS for witnessed syncopal episode at [facility]. Pt was caught by family, denies hitting head or other injuries from fall. Pt states she has had multiple syncopal episodes x3d. She is [redacted]w[redacted]d pregnant. Pt last followed up with OB yesterday. Not dilated during exam yesterday and NST was good per EMS. OB cleared patient yesterday and told her to go to ED if it continued. Pt complains of feeling dizzy, having a headache, and lower abdominal pressure 7/10 for both. +fetal movement. Initial glucose was 67 on EMS arrival that increased to 99 after drinking a coke. Pt A+Ox4, GCS 15.

## 2023-10-05 NOTE — H&P (Addendum)
 History and Physical    Miranda Curtis FMW:[id number] DOB: [birth year] DOA: 10/05/2023  PCP: Pcp, No   Patient coming from: Home   Chief Complaint:  Chief Complaint  Patient presents with   Loss of Consciousness   ED TRIAGE note:  Pt bib EMS for witnessed syncopal episode at [facility]. Pt was caught by family, denies hitting head or other injuries from fall. Pt states she has had multiple syncopal episodes x3d. She is [redacted]w[redacted]d pregnant. Pt last followed up with OB yesterday. Not dilated during exam yesterday and NST was good per EMS. OB cleared patient yesterday and told her to go to ED if it continued. Pt complains of feeling dizzy, having a headache, and lower abdominal pressure 7/10 for both. +fetal movement. Initial glucose was 67 on EMS arrival that increased to 99 after drinking a coke. Pt A+Ox4, GCS 15.              HPI:  Miranda Curtis is a 23 y.o. female history of gastric bypass surgery, ADHD, iron deficiency anemia, vitamin B12 deficiency, panic disorder and generalized disorder G6P2 currently [redacted]w[redacted]d presented to emergency department complaining of recurrent syncope.  Patient was admitted by OB/GYN team and has been discharged yesterday 6/28 for management of recurrent syncope.  While patient was admitted by OB/GYN team further workup revealed recurrent syncope in the setting of low vitamin B12 deficiency and low iron.  Patient was discharged by the OB/GYN team referred for outpatient IV iron infusion in 2 weeks. Patient also seen by neurology yesterday concern for recurrent syncope and paresthesia in the setting of vitamin B12 deficiency.  Patient reported that she has total 3 episodes of syncope over the course of last 2 days.   First episode syncope 6/26 while she went to the bathroom and passed out before passing out she felt dizzy and warmth.  Before passing out patient has blurry vision. Second episode of syncope on the same day while she was watching TV with her grandma and  suddenly passed out without any warning sign. Third episode of syncope today at the [facility] while patient was walking pushing the cart suddenly felt dizzy and feeling warmth afterward she passed out again. Patient also reported fatigue and shortness of breath on exertion. Patient also has tingling and numbness of the bilateral upper and lower extremities.  Repeated that she has gastric bypass surgery 2023 and used to be on vitamin B12 shot due to vitamin B12 deficiency however she has been being noncompliant with oral supplements and again experiencing similar symptoms of tingling numbness like before she used to have B12 deficiency.  Patient denies any weakness, focal weakness, sensory change, dysarthria, dysphagia, and seizure.  Patient denies any blurry vision, double vision, headache, chest pain, cough, wheezing, orthopnea, heartburn, nausea, abdominal pain, nausea, vomiting, constipation, diarrhea, UTI symptoms, neck pain, myalgia, joint pain. Patient denies anxiety, depression, insomnia and nervousness.  Dr. Purvis initially spoken with on-call OB/GYN recommended admit under medicine service for further syncope workup.  I have spoken with on-call OB/GYN Dr. Cleatus for formal consult.  Verified with OB/GYN it is safe to do head CT scan without contrast.  Discussed with the plan for syncope workup include head CT scan, echocardiogram and cardiac monitor.  Also will transfuse IV Venofer while patient is here.  ED Course:  At presentation to ED patient is hemodynamically stable.  CBC showing hemoglobin 8.3, hematocrit 27, low MCV 63 normal WBC and platelet count. CMP showing low sodium 134 baseline sodium is around  129-132, low bicarb 20 albumin 3.2 low LP 30.  EKG showed normal sinus rhythm heart rate 90, normal PR interval, QTc interval and there is no history of abnormality.  Hospitalist has been consulted for management and admission for recurrent syncopal episode.  Significant labs in  the ED: Lab Orders         Comprehensive metabolic panel         CBC with Differential         hCG, serum, qualitative         Urinalysis, Routine w reflex microscopic -Urine, Clean Catch         Comprehensive metabolic panel         CBC         CBG monitoring, ED       Review of Systems:  Review of Systems  Constitutional:  Positive for malaise/fatigue. Negative for chills, diaphoresis, fever and weight loss.  Eyes:  Negative for blurred vision and double vision.  Respiratory:  Negative for cough, sputum production, shortness of breath and wheezing.   Cardiovascular:  Negative for chest pain, palpitations, claudication and leg swelling.  Gastrointestinal:  Negative for abdominal pain, blood in stool, constipation, diarrhea, heartburn, melena, nausea and vomiting.  Genitourinary:  Negative for dysuria, frequency and urgency.  Musculoskeletal:  Negative for back pain, falls, joint pain, myalgias and neck pain.  Neurological:  Positive for dizziness and tingling. Negative for tremors, sensory change, speech change, focal weakness, seizures, loss of consciousness, weakness and headaches.  Endo/Heme/Allergies:  Negative for environmental allergies. Does not bruise/bleed easily.  Psychiatric/Behavioral:  Negative for depression. The patient is not nervous/anxious.     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-has resolved following surgery   Eczema    History of depression 11/18/2017   History of diabetes mellitus 04/11/2022   Maternal varicella, non-immune 05/09/2017   Missed abortion 04/07/2019   Vitamin B 12 deficiency 10/26/2017   05/09/22: b12 injection     Vitamin D  deficiency 05/09/2022    Past Surgical History:  Procedure Laterality Date   CHOLECYSTECTOMY N/A 03/13/2023   Procedure:  LAPAROSCOPIC CHOLECYSTECTOMY WITH ICG DYE;  Surgeon: [name], MD;  Location: [facility];  Service: General;  Laterality: N/A;   DILATION AND EVACUATION N/A 02/24/2023   Procedure: DILATATION AND EVACUATION;  Surgeon: [name], MD;  Location: [facility];  Service: Gynecology;  Laterality: N/A;   SLEEVE GASTROPLASTY  10/12/2021   UPPER GASTROINTESTINAL ENDOSCOPY N/A      reports that she has never smoked. She has never used smokeless tobacco. She reports that she does not drink alcohol and does not use drugs.  Allergies  Allergen Reactions   Reglan  [Metoclopramide ] Anxiety and Other (See Comments)    Severe panic attack    Family History  Problem Relation Age of Onset   Diabetes Mother    Healthy Mother    Sickle cell trait Mother    Sickle cell trait Father    Rashes / Skin problems Father        eczema   Asthma Sister    Rashes / Skin problems Sister        eczema   Rashes / Skin problems Sister        eczema   Rashes / Skin problems Sister  eczema   Sickle cell anemia Sister    Rashes / Skin problems Sister        eczema   Autism Brother    Healthy Brother    Autism Daughter    Diabetes Maternal Grandmother    Hypertension Maternal Grandmother    Cancer Maternal Grandfather        prostate   Cancer Paternal Grandmother 23       lung   Healthy Paternal Grandfather    Ovarian cancer Neg Hx    Colon cancer Neg Hx    Breast cancer Neg Hx     Prior to Admission medications   Medication Sig Start Date End Date Taking? Authorizing Provider  aspirin  EC 81 MG tablet Take 1 tablet (81 mg total) by mouth daily. Swallow whole. 08/12/23   Lo, Arland POUR, CNM  cyclobenzaprine  (FLEXERIL ) 10 MG tablet Take 1 tablet (10 mg total) by mouth 2 (two) times daily as needed. 10/03/23   Dawson, Rolitta, CNM  Ferric Maltol (ACCRUFER) 30 MG CAPS Take 1 capsule (30 mg total) by mouth 2 (two) times daily at 8 am and 10 pm. 10/05/23   Leftwich-Kirby, Olam LABOR, CNM  ondansetron   (ZOFRAN -ODT) 4 MG disintegrating tablet Take 1 tablet (4 mg total) by mouth every 8 (eight) hours as needed. 07/23/23   Letha Renshaw, CNM     Physical Exam: Vitals:   10/05/23 1800 10/05/23 1829 10/05/23 1900 10/05/23 2045  BP: 102/63  111/68 111/65  Pulse: 88  92 93  Resp: 15 14 (!) 25 19  Temp:      TempSrc:      SpO2: 100%  100% 100%  Weight:      Height:        Physical Exam Vitals and nursing note reviewed.  Constitutional:      Appearance: Normal appearance.   Eyes:     Pupils: Pupils are equal, round, and reactive to light.    Cardiovascular:     Rate and Rhythm: Normal rate and regular rhythm.     Pulses: Normal pulses.     Heart sounds: Normal heart sounds. No murmur heard. Pulmonary:     Effort: Pulmonary effort is normal.     Breath sounds: Normal breath sounds.  Abdominal:     Palpations: Abdomen is soft.   Musculoskeletal:     Cervical back: Neck supple.     Right lower leg: No edema.     Left lower leg: No edema.   Skin:    General: Skin is dry.     Capillary Refill: Capillary refill takes less than 2 seconds.   Neurological:     Mental Status: She is alert and oriented to person, place, and time. Mental status is at baseline.     Cranial Nerves: No cranial nerve deficit.     Sensory: No sensory deficit.     Motor: No weakness.     Coordination: Coordination normal.     Gait: Gait normal.     Deep Tendon Reflexes: Reflexes normal.   Psychiatric:        Mood and Affect: Mood normal.        Behavior: Behavior normal.        Thought Content: Thought content normal.        Judgment: Judgment normal.      Labs on Admission: I have personally reviewed following labs and imaging studies  CBC: Recent Labs  Lab 10/03/23 1638 10/04/23 2109 10/05/23 1714  WBC 8.4  8.7 8.3  NEUTROABS  --   --  5.8  HGB 8.4* 8.6* 8.3*  HCT 27.3* 28.3* 27.4*  MCV 62.5* 62.9* 63.4*  PLT 314 316 309   Basic Metabolic Panel: Recent Labs  Lab 10/03/23 1638  10/04/23 2109 10/05/23 1714  NA 129* 133* 134*  K 3.8 3.6 3.9  CL 104 106 104  CO2 15* 18* 20*  GLUCOSE 98 105* 78  BUN <5* 6 6  CREATININE 0.57 0.56 0.63  CALCIUM 8.7* 8.9 9.2   GFR: Estimated Creatinine Clearance: 109.7 mL/min (by C-G formula based on SCr of 0.63 mg/dL). Liver Function Tests: Recent Labs  Lab 10/03/23 1638 10/04/23 2109 10/05/23 1714  AST 15 17 15   ALT 11 12 11   ALKPHOS 32* 30* 30*  BILITOT 0.8 0.7 0.7  PROT 7.0 7.1 7.0  ALBUMIN 3.2* 3.3* 3.2*   No results for input(s): LIPASE, AMYLASE in the last 168 hours. No results for input(s): AMMONIA  in the last 168 hours. Coagulation Profile: No results for input(s): INR, PROTIME in the last 168 hours. Cardiac Enzymes: No results for input(s): CKTOTAL, CKMB, CKMBINDEX, TROPONINI, TROPONINIHS in the last 168 hours. BNP (last 3 results) No results for input(s): BNP in the last 8760 hours. HbA1C: No results for input(s): HGBA1C in the last 72 hours. CBG: Recent Labs  Lab 10/05/23 2030  GLUCAP 89   Lipid Profile: No results for input(s): CHOL, HDL, LDLCALC, TRIG, CHOLHDL, LDLDIRECT in the last 72 hours. Thyroid Function Tests: No results for input(s): TSH, T4TOTAL, FREET4, T3FREE, THYROIDAB in the last 72 hours. Anemia Panel: Recent Labs    10/04/23 2109  VITAMINB12 156*  FOLATE 9.6  FERRITIN 3*  TIBC 622*  IRON 19*  RETICCTPCT 1.6   Urine analysis:    Component Value Date/Time   COLORURINE YELLOW 10/05/2023 1859   APPEARANCEUR CLEAR 10/05/2023 1859   APPEARANCEUR Cloudy (A) 03/25/2019 1446   LABSPEC 1.009 10/05/2023 1859   PHURINE 7.0 10/05/2023 1859   GLUCOSEU NEGATIVE 10/05/2023 1859   HGBUR NEGATIVE 10/05/2023 1859   BILIRUBINUR NEGATIVE 10/05/2023 1859   BILIRUBINUR Negative 08/26/2023 1718   BILIRUBINUR Negative 03/25/2019 1446   KETONESUR 5 (A) 10/05/2023 1859   PROTEINUR NEGATIVE 10/05/2023 1859   UROBILINOGEN 1.0 08/26/2023 1718    UROBILINOGEN 0.2 05/28/2011 1803   NITRITE NEGATIVE 10/05/2023 1859   LEUKOCYTESUR NEGATIVE 10/05/2023 1859    Radiological Exams on Admission: I have personally reviewed images CT HEAD WO CONTRAST ( ) Result Date: 10/05/2023 CLINICAL DATA:  Recurrent syncope EXAM: CT HEAD WITHOUT CONTRAST TECHNIQUE: Contiguous axial images were obtained from the base of the skull through the vertex without intravenous contrast. RADIATION DOSE REDUCTION: This exam was performed according to the departmental dose-optimization program which includes automated exposure control, adjustment of the mA and/or kV according to patient size and/or use of iterative reconstruction technique. COMPARISON:  MRI MRV head Aug 20, 2023.  CT head April 26, 24. FINDINGS: Brain: No evidence of acute infarction, hemorrhage, hydrocephalus, extra-axial collection or mass lesion/mass effect. Vascular: No hyperdense vessel. Skull: No acute fracture. Sinuses/Orbits: Clear sinuses.  No acute orbital findings. Other: No mastoid effusions. IMPRESSION: No evidence of acute intracranial abnormality. Electronically Signed   By: Gilmore GORMAN Molt M.D.   On: 10/05/2023 21:09     EKG: My personal interpretation of EKG shows: EKG showed normal sinus rhythm heart rate 90, normal PR interval normal QTc interval.    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  Patient was at [facility] today had a  sudden episode of syncope.  She was admitted by OB/GYN service for management of recurrent syncope and discharged on 6/8.  Further workup revealed syncope in the setting of probably underlying panic attack and patient found to have paresthesia from underlying vitamin B12 deficiency which is possibly provoking recurrent syncope. - Neurology has been evaluated 6/28 recommended to replete the vitamin B12. - Given patient has a third episode of syncope today came to the ED for further evaluation.  At presentation to ED hemodynamically stable - Patient did not have any prodrome of syncope. -CBC showing evidence of iron deficiency anemia.  Low ferritin in 3.  Low vitamin B12 156.  CMP showing dilutional hyponatremia 134, low bicarb 20 which is expected during pregnancy. -Discussed with Dr. Cleatus it is safe to proceed with obtain head CT scan if needed - Obtaining head CT without contrast to rule out any intracranial abnormality. - Continue cardiac monitoring to monitor for arrhythmia - Obtaining echocardiogram. - On discharge patient will need Holter monitor for 2 weeks to rule out any arrhythmia. -Will treat with IV Venofer and high dose of vitamin B12 IM as mentioned below. Addendum- - CT head no acute intracranial abnormality.   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  Expected dilutional anemia during second low ferritin 3, trimester.  However found to have low ferritin level, low iron level, increased TIBC as well as low vitamin B12 level. - Also treating with IV Venofer 200 mg and IM vitamin B12 1000 mcg for the management of anemia and B12 deficiency. Discussed with pharmacy who recommended to give Venofer 200 mg instead of giving high-dose 500 mg at the same time and pharmacy will arrange outpatient infusion that has been originally  has been placed by OB/GYN service. -Need to check hemoglobin and iron response to ferritin infusion and replete as needed. -Continue oral iron supplement and vitamin B-12 supplement.   Second trimester pregnancy -In the ED physical exam showed gravid abdomen nontender.  Patient does not have any vaginal discharge or bleeding. -In the ED Doppler showed fetal heart rate 162 and grossly normal examination on bedside ultrasound with fetal movement.  Documented by ED physician Dr. Purvis. - Consulted OB/GYN Dr. Cleatus for further management   History of anxiety, depression and ADHD -Currently patient is not on any antianxiety antidepressant and ADHD medication.     DVT prophylaxis:  Lovenox Code Status:  Full Code Diet: Regular diet Family Communication:   Family was present at bedside, at the time of interview. Opportunity was given to ask question and all questions were answered satisfactorily.  Disposition Plan: Need to follow-up with echocardiogram and head CT results. Consults: OB/GYN Admission status:   Observation, Telemetry bed  Severity of Illness: The appropriate patient status for this patient is OBSERVATION. Observation status is judged to be reasonable and necessary in order to provide the required intensity of service to ensure  the patient's safety. The patient's presenting symptoms, physical exam findings, and initial radiographic and laboratory data in the context of their medical condition is felt to place them at decreased risk for further clinical deterioration. Furthermore, it is anticipated that the patient will be medically stable for discharge from the hospital within 2 midnights of admission.     Leydy Worthey, MD [facility]  How to contact the [facility] Attending or Consulting provider 7A - 7P or covering provider during after hours 7P -7A, for this patient.  Check the care team in [facility] and look for a) attending/consulting [facility] provider listed and b) the [facility] team  listed Log into [email or web address] and use [place]'s universal password to access. If you do not have the password, please contact the hospital operator. Locate the [facility] provider you are looking for under [facility] and page to a number that you can be directly reached. If you still have difficulty reaching the provider, please page the [facility] (Director on Call) for the Hospitalists listed on amion for assistance.  10/05/2023, 9:20 PM

## 2023-10-05 NOTE — ED Notes (Signed)
 CCMD aware of patient admit to cardiac monitoring.

## 2023-10-05 NOTE — Plan of Care (Signed)
 Patient was briefly discussed with me.  Reportedly while in MAU for evaluation of syncope she had a presyncopal event described as feeling like her vision was going black and feeling lightheaded which improved with laying down.  Of note on chart review she also has a history of gastric bypass not taking her supplements, persistently low B12  Component Ref Range & Units (hover) 1 d ago 1 yr ago 5 yr ago  Vitamin B-12 156 Low  150 Low  R 231 Low  R   She needs appropriate supplementation for her bypass surgery as this is likely contributing to polyneuropathy that may be exacerbating her predisposition to syncope  With primary team reporting an otherwise reassuring neurological examination, as well as given the description of the event not concerning for seizure activity, outpatient follow-up seems appropriate.  However if additional questions or concerns arise please do not hesitate to reach out to neurology  Lola Jernigan MD-PhD [facility] [phone]  7 minutes spent in care of patient, majority in discussion with Olam Boards

## 2023-10-05 NOTE — ED Notes (Signed)
 Okay to eat per MD. Patient given crackers, peanut butter, and drink.

## 2023-10-05 NOTE — ED Notes (Signed)
 Patient currently in CT

## 2023-10-05 NOTE — ED Provider Notes (Signed)
 [place] EMERGENCY DEPARTMENT AT [facility] Provider Note   CSN: [id number] Arrival date & time: 10/05/23  1654     Patient presents with: Loss of Consciousness   Miranda Curtis is a 23 y.o. pregnant female.  (20 weeks, [id number]) with a history of anemia and B12 deficiency who presents to the ED after syncopal episode.  Patient has experienced syncopal episodes 3 days in a row now.  She was seen in the past 2 days at [facility] been cleared from an OB perspective.  Instructed her to come to the ED if this happened again.  Today when she was in [place] had another syncopal episode with lightheadedness as a prodrome.  No associated trauma.  Feels okay now no chest pain no shortness of breath no nausea vomiting fevers or chills.  No vaginal bleeding.  Some abdominal discomfort.  She does have a history of syncopal episodes with past pregnancies    Loss of Consciousness      Prior to Admission medications   Medication Sig Start Date End Date Taking? Authorizing Provider  aspirin  EC 81 MG tablet Take 1 tablet (81 mg total) by mouth daily. Swallow whole. 08/12/23  Yes Lo, Arland POUR, CNM  cyclobenzaprine  (FLEXERIL ) 10 MG tablet Take 1 tablet (10 mg total) by mouth 2 (two) times daily as needed. 10/03/23  Yes Dawson, Rolitta, CNM  Ferric Maltol (ACCRUFER) 30 MG CAPS Take 1 capsule (30 mg total) by mouth 2 (two) times daily at 8 am and 10 pm. 10/05/23  Yes Leftwich-Kirby, Olam LABOR, CNM  ondansetron  (ZOFRAN -ODT) 4 MG disintegrating tablet Take 1 tablet (4 mg total) by mouth every 8 (eight) hours as needed. 07/23/23  Yes Letha Renshaw, CNM    Allergies: Reglan  [metoclopramide ]    Review of Systems  Cardiovascular:  Positive for syncope.    Updated Vital Signs BP 111/65   Pulse 93   Temp 99 F (37.2 C) (Oral)   Resp 19   Ht 5' 1 (1.549 m)   Wt 88.5 kg   LMP  (Approximate) Comment: End of January  SpO2 100%   BMI 36.84 kg/m   Physical Exam Vitals and nursing note reviewed.  HENT:      Head: Normocephalic and atraumatic.   Eyes:     Pupils: Pupils are equal, round, and reactive to light.    Cardiovascular:     Rate and Rhythm: Normal rate and regular rhythm.  Pulmonary:     Effort: Pulmonary effort is normal.     Breath sounds: Normal breath sounds.  Abdominal:     Palpations: Abdomen is soft.     Tenderness: There is no abdominal tenderness.     Comments: Gravid abdomen nontender   Skin:    General: Skin is warm and dry.   Neurological:     Mental Status: She is alert.   Psychiatric:        Mood and Affect: Mood normal.     (all labs ordered are listed, but only abnormal results are displayed) Labs Reviewed  COMPREHENSIVE METABOLIC PANEL WITH GFR - Abnormal; Notable for the following components:      Result Value   Sodium 134 (*)    CO2 20 (*)    Albumin 3.2 (*)    Alkaline Phosphatase 30 (*)    All other components within normal limits  CBC WITH DIFFERENTIAL/PLATELET - Abnormal; Notable for the following components:   Hemoglobin 8.3 (*)    HCT 27.4 (*)    MCV  63.4 (*)    MCH 19.2 (*)    RDW 18.3 (*)    All other components within normal limits  HCG, SERUM, QUALITATIVE - Abnormal; Notable for the following components:   Preg, Serum POSITIVE (*)    All other components within normal limits  URINALYSIS, ROUTINE W REFLEX MICROSCOPIC - Abnormal; Notable for the following components:   Ketones, ur 5 (*)    All other components within normal limits  COMPREHENSIVE METABOLIC PANEL WITH GFR  CBC  CBG MONITORING, ED    EKG: EKG Interpretation Date/Time:  Saturday October 05 2023 17:23:14 EDT Ventricular Rate:  90 PR Interval:  148 QRS Duration:  80 QT Interval:  353 QTC Calculation: 432 R Axis:   44  Text Interpretation: Sinus rhythm Confirmed by Pamella Sharper [phone]) on 10/05/2023 6:11:00 PM  Radiology: CT HEAD WO CONTRAST ( ) Result Date: 10/05/2023 CLINICAL DATA:  Recurrent syncope EXAM: CT HEAD WITHOUT CONTRAST TECHNIQUE: Contiguous  axial images were obtained from the base of the skull through the vertex without intravenous contrast. RADIATION DOSE REDUCTION: This exam was performed according to the departmental dose-optimization program which includes automated exposure control, adjustment of the mA and/or kV according to patient size and/or use of iterative reconstruction technique. COMPARISON:  MRI MRV head Aug 20, 2023.  CT head April 26, 24. FINDINGS: Brain: No evidence of acute infarction, hemorrhage, hydrocephalus, extra-axial collection or mass lesion/mass effect. Vascular: No hyperdense vessel. Skull: No acute fracture. Sinuses/Orbits: Clear sinuses.  No acute orbital findings. Other: No mastoid effusions. IMPRESSION: No evidence of acute intracranial abnormality. Electronically Signed   By: Gilmore GORMAN Molt M.D.   On: 10/05/2023 21:09     Procedures   Medications Ordered in the ED  ferrous gluconate (FERGON) tablet 324 mg (has no administration in time range)  enoxaparin (LOVENOX) injection 40 mg (has no administration in time range)  sodium chloride  flush (NS) 0.9 % injection 3 mL (has no administration in time range)  acetaminophen  (TYLENOL ) tablet 650 mg (has no administration in time range)    Or  acetaminophen  (TYLENOL ) suppository 650 mg (has no administration in time range)  promethazine  (PHENERGAN ) 25 mg in sodium chloride  0.9 % 50 mL IVPB (has no administration in time range)  cyanocobalamin  (VITAMIN B12) injection 1,000 mcg (has no administration in time range)  polyethylene glycol (MIRALAX  / GLYCOLAX ) packet 17 g (has no administration in time range)  senna-docusate (Senokot-S) tablet 1 tablet (has no administration in time range)  vitamin B-12 (CYANOCOBALAMIN ) tablet 100 mcg (has no administration in time range)  iron sucrose (VENOFER) 200 mg in sodium chloride  0.9 % 100 mL IVPB (has no administration in time range)  dextrose  50 % solution 50 mL (has no administration in time range)  sodium chloride   0.9 % bolus 1,000 mL (0 mLs Intravenous Stopped 10/05/23 2058)    Clinical Course as of 10/05/23 2122  Sat Oct 05, 2023  1829 Fetal movement within normal limits on bedside ultrasound.  Fetal heart rate 162 on Doppler [MP]  1845 Discussed with Dr. Jayne (OB/GYN).  Nothing to do from an OB standpoint.  Most likely cause would be increased progesterone levels in 20 weeks of pregnancy or dehydration. [MP]  2100 Laboratory workup unremarkable overall.  UA negative for UTI.  No significant drop in hemoglobin or electrolyte imbalance.  Unclear cause of repeated episodes of syncope but since this is happening now 3 days in a row I think it is reasonable to admit her for observation and further  diagnostics.  Discussed with admitting hospitalist accepts patient for admission [MP]    Clinical Course User Index [MP] Pamella Ozell LABOR, DO                                 Medical Decision Making 23 year old pregnant female with history as above presenting for syncopal episode.  She is in her 20th week of her pregnancy.  Syncopal episodes the last 2 days for which she was seen at [facility] and cleared.  No other syncopal episode at [facility] today.  No associated trauma.  Feels well now hemodynamically stable.  No vaginal bleeding.  Gravid abdomen that is nontender.  Fetal heart rate of 162 on Doppler.  Grossly normal examination on bedside ultrasound with fetal movement.  Differential diagnosis for syncope includes dehydration, anemia, B12 deficiency, dysrhythmia.  Lower suspicion for PE given lack of shortness of breath and chest pain along with lack of tachycardia hypoxemia however pregnancy would place her at a greater risk.  Will obtain laboratory workup including metabolic panel UA CBC along with an EKG and continue to monitor on telemetry.  Will reach out to [facility] on-call to discuss today's visit as well  Amount and/or Complexity of Data Reviewed Labs: ordered.  Risk Decision regarding hospitalization.         Final diagnoses:  Iron deficiency anemia  Syncope, unspecified syncope type  Second trimester pregnancy    ED Discharge Orders          Ordered    Amb Referral to Intravenous Iron Therapy       Comments: You have been referred to [facility] Infusion team for IV Iron Infusions. The infusion pharmacy team will reach out to you with appointment information.    10/05/23 2058               Pamella Ozell LABOR, DO 10/05/23 2122

## 2023-10-06 ENCOUNTER — Observation Stay (HOSPITAL_BASED_OUTPATIENT_CLINIC_OR_DEPARTMENT_OTHER): Payer: MEDICAID

## 2023-10-06 DIAGNOSIS — O26892 Other specified pregnancy related conditions, second trimester: Secondary | ICD-10-CM | POA: Diagnosis not present

## 2023-10-06 DIAGNOSIS — D509 Iron deficiency anemia, unspecified: Secondary | ICD-10-CM

## 2023-10-06 DIAGNOSIS — D649 Anemia, unspecified: Secondary | ICD-10-CM | POA: Diagnosis not present

## 2023-10-06 DIAGNOSIS — R55 Syncope and collapse: Secondary | ICD-10-CM

## 2023-10-06 DIAGNOSIS — Z3A20 20 weeks gestation of pregnancy: Secondary | ICD-10-CM

## 2023-10-06 LAB — ECHOCARDIOGRAM COMPLETE
AR max vel: 1.87 cm2
AV Area VTI: 1.96 cm2
AV Area mean vel: 2.03 cm2
AV Mean grad: 4.1 mmHg
AV Peak grad: 9.1 mmHg
Ao pk vel: 1.51 m/s
Height: 61 in
S' Lateral: 2.6 cm
Weight: 3120 [oz_av]

## 2023-10-06 LAB — COMPREHENSIVE METABOLIC PANEL WITH GFR
ALT: 9 U/L (ref 0–44)
AST: 14 U/L — ABNORMAL LOW (ref 15–41)
Albumin: 2.8 g/dL — ABNORMAL LOW (ref 3.5–5.0)
Alkaline Phosphatase: 25 U/L — ABNORMAL LOW (ref 38–126)
Anion gap: 4 — ABNORMAL LOW (ref 5–15)
BUN: <5 mg/dL — ABNORMAL LOW (ref 6–20)
CO2: 19 mmol/L — ABNORMAL LOW (ref 22–32)
Calcium: 8.5 mg/dL — ABNORMAL LOW (ref 8.9–10.3)
Chloride: 109 mmol/L (ref 98–111)
Creatinine, Ser: 0.49 mg/dL (ref 0.44–1.00)
GFR, Estimated: >60 mL/min (ref >60)
Glucose, Bld: 86 mg/dL (ref 70–99)
Potassium: 3.7 mmol/L (ref 3.5–5.1)
Sodium: 132 mmol/L — ABNORMAL LOW (ref 135–145)
Total Bilirubin: 0.7 mg/dL (ref 0.0–1.2)
Total Protein: 6 g/dL — ABNORMAL LOW (ref 6.5–8.1)

## 2023-10-06 LAB — CBC
HCT: 25.9 % — ABNORMAL LOW (ref 36.0–46.0)
Hemoglobin: 8 g/dL — ABNORMAL LOW (ref 12.0–15.0)
MCH: 19.8 pg — ABNORMAL LOW (ref 26.0–34.0)
MCHC: 30.9 g/dL (ref 30.0–36.0)
MCV: 64 fL — ABNORMAL LOW (ref 80.0–100.0)
Platelets: 267 10*3/uL (ref 150–400)
RBC: 4.05 MIL/uL (ref 3.87–5.11)
RDW: 18 % — ABNORMAL HIGH (ref 11.5–15.5)
WBC: 8.7 10*3/uL (ref 4.0–10.5)
nRBC: 0 % (ref 0.0–0.2)

## 2023-10-06 MED ORDER — SODIUM CHLORIDE 0.9 % IV BOLUS
500.0000 mL | Freq: Once | INTRAVENOUS | Status: AC
  Administered 2023-10-06: 500 mL via INTRAVENOUS

## 2023-10-06 MED ORDER — COMPLETENATE 29-1 MG PO CHEW
1.0000 | CHEWABLE_TABLET | Freq: Every day | ORAL | Status: DC
  Administered 2023-10-06 – 2023-10-08 (×3): 1 via ORAL
  Filled 2023-10-06 (×3): qty 1

## 2023-10-06 MED ORDER — POTASSIUM CHLORIDE CRYS ER 20 MEQ PO TBCR
40.0000 meq | EXTENDED_RELEASE_TABLET | Freq: Once | ORAL | Status: DC
  Filled 2023-10-06: qty 2

## 2023-10-06 MED ORDER — VITAMIN B-12 100 MCG PO TABS
500.0000 ug | ORAL_TABLET | Freq: Every day | ORAL | Status: DC
  Administered 2023-10-06 – 2023-10-08 (×3): 500 ug via ORAL
  Filled 2023-10-06: qty 1
  Filled 2023-10-06 (×2): qty 5

## 2023-10-06 NOTE — Progress Notes (Signed)
 FHR 155BPM by doppler. Pt reports some abd cramping, but denies vaginal bleeding or leaking of fluid. Pt being transported by the ED staff for echo cardiogram and then will be transferred to 6N room 14.

## 2023-10-06 NOTE — ED Notes (Addendum)
 NT called RN in the room after patient had a syncopal episode while trying to stand to use the restroom. Patients heart rate was 130 when RN entered the room. Patient was assisted back into the bed by the NT patient did not fall. Admitting MD notified.

## 2023-10-06 NOTE — Progress Notes (Signed)
 Patient arrived to 6N14 via stretcher from ED. Pt complained of a 6/10 headache with mild abdominal pain. PRN pain medication administered, bed in lowest position, and call light within reach.

## 2023-10-06 NOTE — Progress Notes (Signed)
 [facility] PROGRESS NOTE   Miranda Curtis FMW:[id number] DOB: [birth year] DOA: 10/05/2023  PCP: Pcp, No  Brief History:  23 y.o. female history of gastric bypass surgery, ADHD, iron deficiency anemia, vitamin B12 deficiency, panic disorder and generalized disorder G6P2 currently [redacted]w[redacted]d presented to emergency department complaining of recurrent syncope.  Patient was admitted by OB/GYN team for management of recurrent syncope and discharged 6/28 .  While patient was admitted by OB/GYN team further workup revealed recurrent syncope in the setting of low vitamin B12 deficiency and low iron.  Patient was discharged by the OB/GYN team referred for outpatient IV iron infusion in 2 weeks. Patient also seen by neurology for recurrent syncope and paresthesia in the setting of vitamin B12 deficiency. Patient has had at least 3 episodes of syncope.  Some of the episodes are concerning for arrhythmia since it occurred without any prodrome.  She was hospitalized again for further evaluation.  Consultants: OB/GYN  Procedures: Echocardiogram is pending    Subjective/Interval History: Patient denies any chest pain shortness of breath this morning.  No nausea vomiting.  No headaches.    Assessment/Plan:  Recurrent syncope Thought to be due to her iron deficiency as well as B12 deficiency.  However some of the features are concerning for arrhythmias. Patient mentions that she had 1 episode of syncope with her previous pregnancy which was a year ago. EKG reviewed.  No QT prolongation noted.  No other concerning findings noted. Echocardiogram is pending. Will check TSH.  Continue to monitor on telemetry.  No arrhythmias identified as of this morning. May need a cardiac monitor.  Will determine this at the time of discharge. CT head was unremarkable.  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  History of gastric bypass surgery Stable.  B12 deficiency/iron deficiency/microcytic  anemia Receiving B12 supplementation.  Receiving iron supplementation.  Second trimester pregnancy OB is following.  Management per them.  They have reviewed medications that patient is on currently.  History of anxiety depression ADHD Currently not on any medications for same.  DVT Prophylaxis: Lovenox Code Status: Full code Family Communication: Discussed with patient Disposition Plan: Mobilize.  Anticipate discharge home when medically stable  Status is: Observation The patient remains OBS appropriate and may or may not d/c before 2 midnights.      Medications: Scheduled:  vitamin B-12  500 mcg Oral Daily   enoxaparin (LOVENOX) injection  40 mg Subcutaneous Q24H   ferrous gluconate  324 mg Oral Q breakfast   polyethylene glycol  17 g Oral Daily   prenatal vitamin w/FE, FA  1 tablet Oral Q1200   sodium chloride  flush  3 mL Intravenous Q12H   Continuous:  promethazine  (PHENERGAN ) injection (IM or IVPB)     PRN:acetaminophen  **OR** acetaminophen , dextrose , promethazine  (PHENERGAN ) injection (IM or IVPB), senna-docusate  Antibiotics: Anti-infectives (From admission, onward)    None       Objective:  Vital Signs  Vitals:   10/06/23 0000 10/06/23 0456 10/06/23 0717 10/06/23 0815  BP: 129/79 108/78 106/71 104/62  Pulse: 90  94 87  Resp: 18 15 18  (!) 21  Temp:  98.3 F (36.8 C) 98.6 F (37 C)   TempSrc:  Oral Oral   SpO2: 100% 100% 100% 100%  Weight:      Height:        Intake/Output Summary (Last 24 hours) at 10/06/2023 0926 Last data filed at 10/05/2023 2356 Gross per 24 hour  Intake 1110.7 ml  Output --  Net 1110.7 ml  Filed Weights   10/05/23 1710  Weight: 88.5 kg    General appearance: Awake alert.  In no distress Resp: Clear to auscultation bilaterally.  Normal effort Cardio: S1-S2 is normal regular.  No S3-S4.  No rubs murmurs or bruit GI: Abdomen is soft.  Nontender nondistended.  Bowel sounds are present normal.  No masses  organomegaly Extremities: No edema.  Full range of motion of lower extremities. Neurologic: Alert and oriented x3.  No focal neurological deficits.    Lab Results:  Data Reviewed: I have personally reviewed following labs and reports of the imaging studies  CBC: Recent Labs  Lab 10/03/23 1638 10/04/23 2109 10/05/23 1714 10/06/23 0421  WBC 8.4 8.7 8.3 8.7  NEUTROABS  --   --  5.8  --   HGB 8.4* 8.6* 8.3* 8.0*  HCT 27.3* 28.3* 27.4* 25.9*  MCV 62.5* 62.9* 63.4* 64.0*  PLT 314 316 309 267    Basic Metabolic Panel: Recent Labs  Lab 10/03/23 1638 10/04/23 2109 10/05/23 1714 10/06/23 0421  NA 129* 133* 134* 132*  K 3.8 3.6 3.9 3.7  CL 104 106 104 109  CO2 15* 18* 20* 19*  GLUCOSE 98 105* 78 86  BUN <5* 6 6 <5*  CREATININE 0.57 0.56 0.63 0.49  CALCIUM 8.7* 8.9 9.2 8.5*    GFR: Estimated Creatinine Clearance: 109.7 mL/min (by C-G formula based on SCr of 0.49 mg/dL).  Liver Function Tests: Recent Labs  Lab 10/03/23 1638 10/04/23 2109 10/05/23 1714 10/06/23 0421  AST 15 17 15  14*  ALT 11 12 11 9   ALKPHOS 32* 30* 30* 25*  BILITOT 0.8 0.7 0.7 0.7  PROT 7.0 7.1 7.0 6.0*  ALBUMIN 3.2* 3.3* 3.2* 2.8*    CBG: Recent Labs  Lab 10/05/23 2030  GLUCAP 89    Anemia Panel: Recent Labs    10/04/23 2109  VITAMINB12 156*  FOLATE 9.6  FERRITIN 3*  TIBC 622*  IRON 19*  RETICCTPCT 1.6    Recent Results (from the past 240 hours)  Wet prep, genital     Status: Abnormal   Collection Time: 10/04/23  8:40 PM  Result Value Ref Range Status   Yeast Wet Prep HPF POC NONE SEEN NONE SEEN Final   Trich, Wet Prep NONE SEEN NONE SEEN Final   Clue Cells Wet Prep HPF POC NONE SEEN NONE SEEN Final   WBC, Wet Prep HPF POC >=10 (A) <10 Final   Sperm NONE SEEN  Final    Comment: Performed at [facility], [address] [place], [place] [address]      Radiology Studies: CT HEAD WO CONTRAST ( ) Result Date: 10/05/2023 CLINICAL DATA:  Recurrent syncope EXAM: CT  HEAD WITHOUT CONTRAST TECHNIQUE: Contiguous axial images were obtained from the base of the skull through the vertex without intravenous contrast. RADIATION DOSE REDUCTION: This exam was performed according to the departmental dose-optimization program which includes automated exposure control, adjustment of the mA and/or kV according to patient size and/or use of iterative reconstruction technique. COMPARISON:  MRI MRV head Aug 20, 2023.  CT head April 26, 24. FINDINGS: Brain: No evidence of acute infarction, hemorrhage, hydrocephalus, extra-axial collection or mass lesion/mass effect. Vascular: No hyperdense vessel. Skull: No acute fracture. Sinuses/Orbits: Clear sinuses.  No acute orbital findings. Other: No mastoid effusions. IMPRESSION: No evidence of acute intracranial abnormality. Electronically Signed   By: Gilmore GORMAN Molt M.D.   On: 10/05/2023 21:09       LOS: 0 days  Miranda Curtis  [facility] Pager on [email or web address]  10/06/2023, 9:26 AM

## 2023-10-06 NOTE — Progress Notes (Signed)
 This nurse attempted to perform orthostatic vitals per order, pt declined saying she feels weak right now and would like to hold off until she feels better. MD Verdene notified.

## 2023-10-06 NOTE — Plan of Care (Signed)
  Problem: Pain Managment: Goal: General experience of comfort will improve and/or be controlled Outcome: Progressing

## 2023-10-06 NOTE — Consult Note (Signed)
 Cardiology Consultation   Patient ID: Miranda Curtis MRN: [id number]; DOB: June 07, 2000  Admit date: 10/05/2023 Date of Consult: 10/06/2023  PCP:  Freddrick, No   [facility] Providers Cardiologist:  None   {    Patient Profile: Miranda Curtis is a 23 y.o. female with a hx of gastric bypass, anxiety, iron deficient anemia, currently [redacted] weeks pregnant who is being seen 10/06/2023 for the evaluation of syncope at the request of Dr Verdene.  History of Present Illness: Miranda Curtis 23 yo female with a hx of gastric bypass, anxiety, iron deficient anemia, currently [redacted] weeks pregnant admitted with syncope.   Seen by Dr Hobart 06/2022 for syncope as outpatient. Was around [redacted] weeks pregnant at the time. Was having presyncope and syncopal events, most commonly with prolonged standing.  -thought to be due to orthostatic syncope of pregnancy - was to have echo but don't see completed -did have 2022 echo at [facility]: normal LV and RV function, no significant pathology - 06/2022 monitor looks like data was never received  - reports intermittent lightheadedness and dizziness, syncope since 2023. Symptoms can occur when pregnant or not, though increased in setting of pregnancy. Remotes roughly 4-5 episodes over the last week or so. One episode occurred while walking at [employer]. Was picking up a pineapple and suddently felt very hot, dizzy. No specific nausea, had some palpitations. Next thing she remembers is coming to with her sister nearby. Similar episode while sitting on the computer, sudden onset of feeling hot, spotty vision followed by syncope. Another episode occurred at school. Had feeling of pressure in stomach, dizziness. Stood up to walk to bathroom and had episode of syncope.     WBC 8.7 Hgb 8.6 Plt 316 Na 133 K 3.6 BUN 6 Cr 0.56  EKG NSR  CT head: no acute process    Past Medical History:  Diagnosis Date   Anovulation 06/08/2020   Anxiety    Attention deficit hyperactivity disorder  (ADHD)    Depression    Diabetes mellitus without complication (HCC)    states prior to gastric sleeve surgery-has resolved following surgery   Eczema    History of depression 11/18/2017   History of diabetes mellitus 04/11/2022   Maternal varicella, non-immune 05/09/2017   Missed abortion 04/07/2019   Vitamin B 12 deficiency 10/26/2017   05/09/22: b12 injection     Vitamin D  deficiency 05/09/2022    Past Surgical History:  Procedure Laterality Date   CHOLECYSTECTOMY N/A 03/13/2023   Procedure: LAPAROSCOPIC CHOLECYSTECTOMY WITH ICG DYE;  Surgeon: [name], MD;  Location: [facility];  Service: General;  Laterality: N/A;   DILATION AND EVACUATION N/A 02/24/2023   Procedure: DILATATION AND EVACUATION;  Surgeon: [name], MD;  [facility];  Service: Gynecology;  Laterality: N/A;   SLEEVE GASTROPLASTY  10/12/2021   UPPER GASTROINTESTINAL ENDOSCOPY N/A      Scheduled Meds:  vitamin B-12  500 mcg Oral Daily   enoxaparin (LOVENOX) injection  40 mg Subcutaneous Q24H   ferrous gluconate  324 mg Oral Q breakfast   polyethylene glycol  17 g Oral Daily   potassium chloride  40 mEq Oral Once   prenatal vitamin w/FE, FA  1 tablet Oral Q1200   sodium chloride  flush  3 mL Intravenous Q12H   Continuous Infusions:  promethazine  (PHENERGAN ) injection (IM or IVPB)     PRN Meds: acetaminophen  **OR** acetaminophen , dextrose , promethazine  (PHENERGAN ) injection (IM or IVPB), senna-docusate  Allergies:    Allergies  Allergen Reactions  Reglan  [Metoclopramide ] Anxiety and Other (See Comments)    Severe panic attack    Social History:   Social History   Socioeconomic History   Marital status: Single    Spouse name: Not on file   Number of children: 1   Years of education: 13   Highest education level: Not on file  Occupational History   Occupation: Consulting civil engineer   Occupation: CNA  Tobacco Use   Smoking status: Never   Smokeless tobacco: Never  Vaping Use   Vaping status:  Never Used  Substance and Sexual Activity   Alcohol use: No   Drug use: No   Sexual activity: Yes    Partners: Male    Birth control/protection: None    Comment: last SI 08/18/23  Other Topics Concern   Not on file  Social History Narrative   Not on file   Social Drivers of Health   Financial Resource Strain: Low Risk  (07/29/2023)   Overall Financial Resource Strain (CARDIA)    Difficulty of Paying Living Expenses: Not very hard  Food Insecurity: No Food Insecurity (08/20/2023)   Hunger Vital Sign    Worried About Running Out of Food in the Last Year: Never true    Ran Out of Food in the Last Year: Never true  Transportation Needs: No Transportation Needs (08/20/2023)   PRAPARE - Administrator, Civil Service (Medical): No    Lack of Transportation (Non-Medical): No  Physical Activity: Inactive (07/29/2023)   Exercise Vital Sign    Days of Exercise per Week: 0 days    Minutes of Exercise per Session: 0 min  Stress: Stress Concern Present (07/29/2023)   [employer] of Occupational Health - Occupational Stress Questionnaire    Feeling of Stress : To some extent  Social Connections: Unknown (08/20/2023)   Social Connection and Isolation Panel    Frequency of Communication with Friends and Family: More than three times a week    Frequency of Social Gatherings with Friends and Family: More than three times a week    Attends Religious Services: Patient declined    Database administrator or Organizations: Patient declined    Attends Banker Meetings: Patient declined    Marital Status: Living with partner  Intimate Partner Violence: Not At Risk (08/20/2023)   Humiliation, Afraid, Rape, and Kick questionnaire    Fear of Current or Ex-Partner: No    Emotionally Abused: No    Physically Abused: No    Sexually Abused: No    Family History:    Family History  Problem Relation Age of Onset   Diabetes Mother    Healthy Mother    Sickle cell trait Mother     Sickle cell trait Father    Rashes / Skin problems Father        eczema   Asthma Sister    Rashes / Skin problems Sister        eczema   Rashes / Skin problems Sister        eczema   Rashes / Skin problems Sister        eczema   Sickle cell anemia Sister    Rashes / Skin problems Sister        eczema   Autism Brother    Healthy Brother    Autism Daughter    Diabetes Maternal Grandmother    Hypertension Maternal Grandmother    Cancer Maternal Grandfather        prostate  Cancer Paternal Grandmother 44       lung   Healthy Paternal Grandfather    Ovarian cancer Neg Hx    Colon cancer Neg Hx    Breast cancer Neg Hx      ROS:  Please see the history of present illness.   All other ROS reviewed and negative.     Physical Exam/Data: Vitals:   10/06/23 1345 10/06/23 1400 10/06/23 1415 10/06/23 1424  BP: 100/60 105/77 100/71   Pulse: 93 (!) 108 (!) 111 (!) 143  Resp: 14 18 (!) 22 (!) 29  Temp:      TempSrc:      SpO2: 100% 100% 100% 100%  Weight:      Height:        Intake/Output Summary (Last 24 hours) at 10/06/2023 1447 Last data filed at 10/05/2023 2356 Gross per 24 hour  Intake 1110.7 ml  Output --  Net 1110.7 ml      10/05/2023    5:10 PM 09/11/2023   10:05 AM 08/26/2023    4:03 PM  Last 3 Weights  Weight (lbs) 195 lb 195 lb 12.8 oz 199 lb 9.6 oz  Weight (kg) 88.451 kg 88.814 kg 90.538 kg     Body mass index is 36.84 kg/m.  General:  Well nourished, well developed, in no acute distress HEENT: normal Neck: no JVD Vascular: No carotid bruits; Distal pulses 2+ bilaterally Cardiac:  normal S1, S2; RRR; no murmur  Lungs:  clear to auscultation bilaterally, no wheezing, rhonchi or rales  Abd: soft, nontender, no hepatomegaly  Ext: no edema Musculoskeletal:  No deformities, BUE and BLE strength normal and equal Skin: warm and dry  Neuro:  CNs 2-12 intact, no focal abnormalities noted Psych:  Normal affect     Laboratory Data: High Sensitivity  Troponin:  No results for input(s): TROPONINIHS in the last 720 hours.   Chemistry Recent Labs  Lab 10/04/23 2109 10/05/23 1714 10/06/23 0421  NA 133* 134* 132*  K 3.6 3.9 3.7  CL 106 104 109  CO2 18* 20* 19*  GLUCOSE 105* 78 86  BUN 6 6 <5*  CREATININE 0.56 0.63 0.49  CALCIUM 8.9 9.2 8.5*  GFRNONAA >60 >60 >60  ANIONGAP 9 10 4*    Recent Labs  Lab 10/04/23 2109 10/05/23 1714 10/06/23 0421  PROT 7.1 7.0 6.0*  ALBUMIN 3.3* 3.2* 2.8*  AST 17 15 14*  ALT 12 11 9   ALKPHOS 30* 30* 25*  BILITOT 0.7 0.7 0.7   Lipids No results for input(s): CHOL, TRIG, HDL, LABVLDL, LDLCALC, CHOLHDL in the last 168 hours.  Hematology Recent Labs  Lab 10/04/23 2109 10/05/23 1714 10/06/23 0421  WBC 8.7 8.3 8.7  RBC 4.50  4.39 4.32 4.05  HGB 8.6* 8.3* 8.0*  HCT 28.3* 27.4* 25.9*  MCV 62.9* 63.4* 64.0*  MCH 19.1* 19.2* 19.8*  MCHC 30.4 30.3 30.9  RDW 18.2* 18.3* 18.0*  PLT 316 309 267   Thyroid No results for input(s): TSH, FREET4 in the last 168 hours.  BNPNo results for input(s): BNP, PROBNP in the last 168 hours.  DDimer No results for input(s): DDIMER in the last 168 hours.  Radiology/Studies:  CT HEAD WO CONTRAST ( ) Result Date: 10/05/2023 CLINICAL DATA:  Recurrent syncope EXAM: CT HEAD WITHOUT CONTRAST TECHNIQUE: Contiguous axial images were obtained from the base of the skull through the vertex without intravenous contrast. RADIATION DOSE REDUCTION: This exam was performed according to the departmental dose-optimization program which includes automated exposure  control, adjustment of the mA and/or kV according to patient size and/or use of iterative reconstruction technique. COMPARISON:  MRI MRV head Aug 20, 2023.  CT head April 26, 24. FINDINGS: Brain: No evidence of acute infarction, hemorrhage, hydrocephalus, extra-axial collection or mass lesion/mass effect. Vascular: No hyperdense vessel. Skull: No acute fracture. Sinuses/Orbits: Clear sinuses.  No  acute orbital findings. Other: No mastoid effusions. IMPRESSION: No evidence of acute intracranial abnormality. Electronically Signed   By: Gilmore GORMAN Molt M.D.   On: 10/05/2023 21:09     Assessment and Plan: 1.Syncope in setting of pregnancy - she reports intermittent dizziness and syncope since 2023. Has had episodes both when pregnant and not, symptoms more frequent and severe in pregnancy - often occur while standing but some episodes while sitting. Can have some dizziness when laying flat but never had syncope. She has chronic dizziness in general when standing.  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 No concerns for seizures based on history.  - patient also anemia, low iron. Received IV iron infusions.   - echo is pending - follow telemetry while here, at discharge would plan for 2 week zio patch and would plan on outpatient f/u in cardio obstetrics clinic with Dr Sheena - continue regular oral hydration, would recommend she restart wearing her compression stockings.      For questions or updates, please contact [facility] Please consult [email or web address] for contact info under    Signed, Alvan Carrier, MD  10/06/2023 2:47 PM

## 2023-10-06 NOTE — Consult Note (Signed)
 OBSTETRICS AND GYNECOLOGY ATTENDING CONSULT NOTE  Consult Date: 10/06/2023 Reason for Consult: Pregnancy with Syncope Consulting Provider: Hospitalist   Assessment/Plan: #Pregnancy - PNV ordered - Will have RROB check FHT daily  #Syncope - Medication list reviewed and all safe for pregnancy - Imaging acceptable as indicated including CT. Can shield abdomen when not imaging the abdomen - Echo planned. Currently on telemetry - s/p IV iron and IM B12.  - Appreciate care of Miranda Curtis by her primary team  Will continue daily rounding. Daily FHR assessment will be done by our Rapid Response OB Nurse as directed.   Please call [phone] [facility] OB/GYN Consult Attending Monday-Friday 8am - 5pm) or [phone] [facility] OB/GYN Attending On Call all day, every day) for any obstetric at any time.  Thank you for involving us  in the care of this patient.  Total consultation time including face-to-face time with patient (>50% of time), reviewing chart and documentation: 30 minutes  Vina Solian MD, FACOG Obstetrician & Gynecologist, [facility] for [facility], [facility] Consult Phone: [phone]   History of Present Illness: Miranda Curtis is an 23 y.o. [id number] female at [redacted]w[redacted]d who was admitted for recurrent syncopal episodes. She had one as recent as yesterday. She does not have forewarning symptoms. She has iron deficiency and B12 anemia which she has now had treatment for (IV iron x1 and IM B12). She does have a history of bypass.   Pertinent OB/GYN History: No LMP recorded (approximate). Patient is pregnant. OB History  Gravida Para Term Preterm AB Living  6 2 2  0 3 2  SAB IAB Ectopic Multiple Live Births  2 1 0 0 2    # Outcome Date GA Lbr Len/2nd Weight Sex Type Anes PTL Lv  6 Current           5 IAB 02/2023          4 Term 10/25/22 [redacted]w[redacted]d 03:29 / 00:09 3010 g M Vag-Spont EPI  LIV     Name: Miranda Curtis.     Apgar1: 9  Apgar5: 9  3  SAB 04/10/19 [redacted]w[redacted]d         2 Term 11/08/17 [redacted]w[redacted]d 21:32 / 00:51 3060 g F Vag-Spont EPI N LIV     Name: Miranda Curtis     Apgar1: 7  Apgar5: 9  1 SAB 10/07/16 [redacted]w[redacted]d           Patient Active Problem List   Diagnosis Date Noted   Recurrent syncope 10/05/2023   Second trimester pregnancy 10/05/2023   Vitamin B12 deficiency 10/05/2023   Iron deficiency anemia 10/03/2023   Obesity affecting pregnancy, antepartum 09/16/2023   Short interval between pregnancies affecting pregnancy, antepartum 08/12/2023   Maternal varicella, non-immune 08/12/2023   Supervision of other normal pregnancy, antepartum 07/29/2023   Anxiety and depression 07/29/2023   ADHD 07/29/2023   History of gastric bypass 04/11/2022    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-has resolved following surgery   Eczema    History of depression 11/18/2017   History of diabetes mellitus 04/11/2022   Maternal varicella, non-immune 05/09/2017   Missed abortion 04/07/2019   Vitamin B 12 deficiency 10/26/2017   05/09/22: b12 injection     Vitamin D  deficiency 05/09/2022    Past Surgical  History:  Procedure Laterality Date   CHOLECYSTECTOMY N/A 03/13/2023   Procedure: LAPAROSCOPIC CHOLECYSTECTOMY WITH ICG DYE;  Surgeon: [name], MD;  Location: [facility];  Service: General;  Laterality: N/A;   DILATION AND EVACUATION N/A 02/24/2023   Procedure: DILATATION AND EVACUATION;  Surgeon: [name], MD;  Location: [facility] OR;  Service: Gynecology;  Laterality: N/A;   SLEEVE GASTROPLASTY  10/12/2021   UPPER GASTROINTESTINAL ENDOSCOPY N/A     Family History  Problem Relation Age of Onset   Diabetes Mother    Healthy Mother    Sickle cell trait Mother    Sickle cell trait Father    Rashes / Skin problems Father        eczema   Asthma Sister     Rashes / Skin problems Sister        eczema   Rashes / Skin problems Sister        eczema   Rashes / Skin problems Sister        eczema   Sickle cell anemia Sister    Rashes / Skin problems Sister        eczema   Autism Brother    Healthy Brother    Autism Daughter    Diabetes Maternal Grandmother    Hypertension Maternal Grandmother    Cancer Maternal Grandfather        prostate   Cancer Paternal Grandmother 52       lung   Healthy Paternal Grandfather    Ovarian cancer Neg Hx    Colon cancer Neg Hx    Breast cancer Neg Hx     Social History:  reports that she has never smoked. She has never used smokeless tobacco. She reports that she does not drink alcohol and does not use drugs.  Allergies:  Allergies  Allergen Reactions   Reglan  [Metoclopramide ] Anxiety and Other (See Comments)    Severe panic attack    Medications: I have reviewed the patient's current medications.  Review of Systems: Pertinent items are noted in HPI.  Focused Physical Examination: BP 129/79   Pulse 90   Temp 98.1 F (36.7 C) (Oral)   Resp 18   Ht 5' 1 (1.549 m)   Wt 88.5 kg   LMP  (Approximate) Comment: End of January  SpO2 100%   BMI 36.84 kg/m  CONSTITUTIONAL: Well-developed, well-nourished female in no acute distress.  NEUROLOGIC: Alert and oriented to person, place, and time. Normal reflexes, muscle tone coordination. No cranial nerve deficit noted. PSYCHIATRIC: Normal mood and affect. Normal behavior. Normal judgment and thought content. CARDIOVASCULAR: Normal heart rate noted RESPIRATORY:  Effort normal, no problems with respiration noted. ABDOMEN: Soft, normal bowel sounds, no distention noted.  No tenderness, rebound or guarding. Gravid.  PELVIC: Not examined MUSCULOSKELETAL: Normal range of motion. No tenderness.  No cyanosis, clubbing, or edema.  2+ distal pulses.  Labs and Imaging: Results for orders placed or performed during the hospital encounter of 10/05/23 (from the  past 72 hours)  Comprehensive metabolic panel     Status: Abnormal   Collection Time: 10/05/23  5:14 PM  Result Value Ref Range   Sodium 134 (L) 135 - 145 mmol/L   Potassium 3.9 3.5 - 5.1 mmol/L   Chloride 104 98 - 111 mmol/L   CO2 20 (L) 22 - 32 mmol/L   Glucose, Bld 78 70 - 99 mg/dL    Comment: Glucose reference range applies only to samples taken after fasting for at least 8  hours.   BUN 6 6 - 20 mg/dL   Creatinine, Ser 9.36 0.44 - 1.00 mg/dL   Calcium 9.2 8.9 - 89.6 mg/dL   Total Protein 7.0 6.5 - 8.1 g/dL   Albumin 3.2 (L) 3.5 - 5.0 g/dL   AST 15 15 - 41 U/L   ALT 11 0 - 44 U/L   Alkaline Phosphatase 30 (L) 38 - 126 U/L   Total Bilirubin 0.7 0.0 - 1.2 mg/dL   GFR, Estimated >39 >39 mL/min    Comment: (NOTE) Calculated using the CKD-EPI Creatinine Equation (2021)    Anion gap 10 5 - 15    Comment: Performed at [facility], [address] [place], [place] [address]  CBC with Differential     Status: Abnormal   Collection Time: 10/05/23  5:14 PM  Result Value Ref Range   WBC 8.3 4.0 - 10.5 K/uL   RBC 4.32 3.87 - 5.11 MIL/uL   Hemoglobin 8.3 (L) 12.0 - 15.0 g/dL    Comment: Reticulocyte Hemoglobin testing may be clinically indicated, consider ordering this additional test [id number]    HCT 27.4 (L) 36.0 - 46.0 %   MCV 63.4 (L) 80.0 - 100.0 fL   MCH 19.2 (L) 26.0 - 34.0 pg   MCHC 30.3 30.0 - 36.0 g/dL   RDW 81.6 (H) 88.4 - 84.4 %   Platelets 309 150 - 400 K/uL    Comment: REPEATED TO VERIFY   nRBC 0.0 0.0 - 0.2 %   Neutrophils Relative % 70 %   Neutro Abs 5.8 1.7 - 7.7 K/uL   Lymphocytes Relative 21 %   Lymphs Abs 1.8 0.7 - 4.0 K/uL   Monocytes Relative 6 %   Monocytes Absolute 0.5 0.1 - 1.0 K/uL   Eosinophils Relative 2 %   Eosinophils Absolute 0.2 0.0 - 0.5 K/uL   Basophils Relative 1 %   Basophils Absolute 0.0 0.0 - 0.1 K/uL   Immature Granulocytes 0 %   Abs Immature Granulocytes 0.03 0.00 - 0.07 K/uL    Comment: Performed at [facility],  [address] [place], [place] [address]  hCG, serum, qualitative     Status: Abnormal   Collection Time: 10/05/23  5:14 PM  Result Value Ref Range   Preg, Serum POSITIVE (A) NEGATIVE    Comment:        THE SENSITIVITY OF THIS METHODOLOGY IS >10 mIU/mL. Performed at [facility], [address], [place], [place] [address]   Urinalysis, Routine w reflex microscopic -Urine, Clean Catch     Status: Abnormal   Collection Time: 10/05/23  6:59 PM  Result Value Ref Range   Color, Urine YELLOW YELLOW   APPearance CLEAR CLEAR   Specific Gravity, Urine 1.009 1.005 - 1.030   pH 7.0 5.0 - 8.0   Glucose, UA NEGATIVE NEGATIVE mg/dL   Hgb urine dipstick NEGATIVE NEGATIVE   Bilirubin Urine NEGATIVE NEGATIVE   Ketones, ur 5 (A) NEGATIVE mg/dL   Protein, ur NEGATIVE NEGATIVE mg/dL   Nitrite NEGATIVE NEGATIVE   Leukocytes,Ua NEGATIVE NEGATIVE    Comment: Performed at [facility], [address], [place], [place] [address]  CBG monitoring, ED     Status: None   Collection Time: 10/05/23  8:30 PM  Result Value Ref Range   Glucose-Capillary 89 70 - 99 mg/dL    Comment: Glucose reference range applies only to samples taken after fasting for at least 8 hours.  CT HEAD WO CONTRAST ( ) Result Date: 10/05/2023 CLINICAL DATA:  Recurrent syncope EXAM: CT HEAD WITHOUT CONTRAST TECHNIQUE: Contiguous axial images were obtained from the base of the skull through the vertex without intravenous contrast. RADIATION DOSE REDUCTION: This exam was performed according to the departmental dose-optimization program which includes automated exposure control, adjustment of the mA and/or kV according to patient size and/or use of iterative reconstruction technique. COMPARISON:  MRI MRV head Aug 20, 2023.  CT head April 26, 24. FINDINGS: Brain: No evidence of acute infarction, hemorrhage, hydrocephalus, extra-axial collection or mass lesion/mass effect. Vascular: No hyperdense vessel. Skull: No acute fracture.  Sinuses/Orbits: Clear sinuses.  No acute orbital findings. Other: No mastoid effusions. IMPRESSION: No evidence of acute intracranial abnormality. Electronically Signed   By: Gilmore GORMAN Molt M.D.   On: 10/05/2023 21:09

## 2023-10-06 NOTE — ED Notes (Signed)
 Patient complaining of headache.  Medicated with PRN medication per orders

## 2023-10-06 NOTE — ED Notes (Signed)
 Patient bring transported to echo

## 2023-10-06 NOTE — Progress Notes (Signed)
  Echocardiogram 2D Echocardiogram has been performed.  Miranda Curtis 10/06/2023, 4:03 PM

## 2023-10-07 ENCOUNTER — Other Ambulatory Visit: Payer: Self-pay | Admitting: Cardiology

## 2023-10-07 ENCOUNTER — Telehealth: Payer: Self-pay

## 2023-10-07 ENCOUNTER — Telehealth: Payer: Self-pay | Admitting: Internal Medicine

## 2023-10-07 DIAGNOSIS — O26892 Other specified pregnancy related conditions, second trimester: Secondary | ICD-10-CM | POA: Diagnosis not present

## 2023-10-07 DIAGNOSIS — Z3A20 20 weeks gestation of pregnancy: Secondary | ICD-10-CM

## 2023-10-07 DIAGNOSIS — D509 Iron deficiency anemia, unspecified: Secondary | ICD-10-CM | POA: Diagnosis not present

## 2023-10-07 DIAGNOSIS — R55 Syncope and collapse: Secondary | ICD-10-CM

## 2023-10-07 LAB — CORTISOL: Cortisol, Plasma: 9 ug/dL

## 2023-10-07 LAB — COMPREHENSIVE METABOLIC PANEL WITH GFR
ALT: 9 U/L (ref 0–44)
AST: 16 U/L (ref 15–41)
Albumin: 2.9 g/dL — ABNORMAL LOW (ref 3.5–5.0)
Alkaline Phosphatase: 31 U/L — ABNORMAL LOW (ref 38–126)
Anion gap: 6 (ref 5–15)
BUN: 5 mg/dL — ABNORMAL LOW (ref 6–20)
CO2: 20 mmol/L — ABNORMAL LOW (ref 22–32)
Calcium: 8.8 mg/dL — ABNORMAL LOW (ref 8.9–10.3)
Chloride: 109 mmol/L (ref 98–111)
Creatinine, Ser: 0.66 mg/dL (ref 0.44–1.00)
GFR, Estimated: >60 mL/min (ref >60)
Glucose, Bld: 97 mg/dL (ref 70–99)
Potassium: 3.7 mmol/L (ref 3.5–5.1)
Sodium: 135 mmol/L (ref 135–145)
Total Bilirubin: 0.3 mg/dL (ref 0.0–1.2)
Total Protein: 6.4 g/dL — ABNORMAL LOW (ref 6.5–8.1)

## 2023-10-07 LAB — CBC
HCT: 25.5 % — ABNORMAL LOW (ref 36.0–46.0)
Hemoglobin: 8 g/dL — ABNORMAL LOW (ref 12.0–15.0)
MCH: 19.6 pg — ABNORMAL LOW (ref 26.0–34.0)
MCHC: 31.4 g/dL (ref 30.0–36.0)
MCV: 62.5 fL — ABNORMAL LOW (ref 80.0–100.0)
Platelets: 280 10*3/uL (ref 150–400)
RBC: 4.08 MIL/uL (ref 3.87–5.11)
RDW: 18.2 % — ABNORMAL HIGH (ref 11.5–15.5)
WBC: 7.2 10*3/uL (ref 4.0–10.5)
nRBC: 0.4 % — ABNORMAL HIGH (ref 0.0–0.2)

## 2023-10-07 LAB — MAGNESIUM: Magnesium: 1.7 mg/dL (ref 1.7–2.4)

## 2023-10-07 LAB — TSH: TSH: 1.129 u[IU]/mL (ref 0.350–4.500)

## 2023-10-07 MED ORDER — SODIUM CHLORIDE 0.9 % IV SOLN: INTRAVENOUS | Status: AC

## 2023-10-07 MED ORDER — POTASSIUM CHLORIDE CRYS ER 20 MEQ PO TBCR
40.0000 meq | EXTENDED_RELEASE_TABLET | Freq: Once | ORAL | Status: AC
  Administered 2023-10-07: 40 meq via ORAL

## 2023-10-07 MED ORDER — SODIUM CHLORIDE 0.9 % IV BOLUS
500.0000 mL | Freq: Once | INTRAVENOUS | Status: AC
  Administered 2023-10-07: 500 mL via INTRAVENOUS

## 2023-10-07 NOTE — Progress Notes (Signed)
 FHR 161 BPM by doppler. Pt c/o feeling abd cramping, but denies vaginal bleeding or leaking of fluid. Says she still feels dizzy when she gets up. She reports good fetal movement.

## 2023-10-07 NOTE — Progress Notes (Signed)
 [facility] PROGRESS NOTE   Judyann Casasola FMW:[id number] DOB: 2000/11/15 DOA: 10/05/2023  PCP: Pcp, No  Brief History:  23 y.o. female history of gastric bypass surgery, ADHD, iron deficiency anemia, vitamin B12 deficiency, panic disorder and generalized disorder G6P2 currently [redacted]w[redacted]d presented to emergency department complaining of recurrent syncope.  Patient was admitted by OB/GYN team for management of recurrent syncope and discharged 6/28 .  While patient was admitted by OB/GYN team further workup revealed recurrent syncope in the setting of low vitamin B12 deficiency and low iron.  Patient was discharged by the OB/GYN team referred for outpatient IV iron infusion in 2 weeks. Patient also seen by neurology for recurrent syncope and paresthesia in the setting of vitamin B12 deficiency. Patient has had at least 3 episodes of syncope.  Some of the episodes are concerning for arrhythmia since it occurred without any prodrome.  She was hospitalized again for further evaluation.  Consultants: OB/GYN.  Cardiology  Procedures: Echocardiogram    Subjective/Interval History: Patient continues to feel weak and dizzy especially when she gets out of the bed.  Occasionally feels dizzy while in the bed.  No further passing out episodes since the episode yesterday afternoon.      Assessment/Plan:  Recurrent syncope/orthostatic hypotension Thought to be due to her iron deficiency as well as B12 deficiency.  However some of the features are concerning for arrhythmias. Patient mentions that she had 1 episode of syncope with her previous pregnancy which was a year ago. EKG reviewed.  No QT prolongation noted.  No other concerning findings noted. Patient had another syncopal episode in the hospital yesterday afternoon.  This was when she got out of the bed and started by the side of the bed.  Orthostatic vital signs noted to be positive.  Patient might have autonomic dysfunction from B12  deficiency. Echocardiogram noted to be unremarkable with normal LVEF.  No significant valvular abnormalities noted. Cardiology was consulted and we appreciate their input.  They recommended ZIO monitor at discharge. TED stockings have been ordered.  Will check cortisol level. Labs are pending from today. CT head was unremarkable.  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  History of gastric bypass surgery Stable.  B12 deficiency/iron deficiency/microcytic anemia Receiving B12 supplementation.  Receiving iron supplementation.  Second trimester pregnancy OB is following.  Management per them.  They have reviewed patient's inpatient medications.  History of anxiety depression ADHD Currently not on any medications for same.  DVT Prophylaxis: Lovenox Code Status: Full code Family Communication: Discussed with patient Disposition Plan: Continues to have symptoms.  Will need to stay in the hospital till she is safely ambulating.      Medications: Scheduled:  enoxaparin (LOVENOX) injection  40 mg Subcutaneous Q24H   ferrous gluconate  324 mg Oral Q breakfast   polyethylene glycol  17 g Oral Daily   potassium chloride  40 mEq Oral Once   prenatal vitamin w/FE, FA  1 tablet Oral Q1200   sodium chloride  flush  3 mL Intravenous Q12H   vitamin B-12  500 mcg Oral Daily   Continuous:  sodium chloride      Followed by   sodium chloride      promethazine  (PHENERGAN ) injection (IM or IVPB)     PRN:acetaminophen  **OR** acetaminophen , dextrose , promethazine  (PHENERGAN ) injection (IM or IVPB), senna-docusate  Antibiotics: Anti-infectives (From admission, onward)    None       Objective:  Vital Signs  Vitals:   10/06/23 2006 10/07/23 0013 10/07/23 0455 10/07/23  0722  BP: 104/67 (!) 89/57 (!) 96/57 98/66  Pulse: 92 86 81 92  Resp: 16 16 18 16   Temp: 98.5 F (36.9 C) (!) 97.3 F (36.3 C) 97.9 F (36.6 C) 98.1 F (36.7 C)  TempSrc: Oral Oral Oral Oral  SpO2:  100% 99% 100% 100%  Weight:      Height:        Intake/Output Summary (Last 24 hours) at 10/07/2023 0917 Last data filed at 10/06/2023 1730 Gross per 24 hour  Intake 360 ml  Output --  Net 360 ml   Filed Weights   10/05/23 1710  Weight: 88.5 kg    General appearance: Awake alert.  In no distress Resp: Clear to auscultation bilaterally.  Normal effort Cardio: S1-S2 is normal regular.  No S3-S4.  No rubs murmurs or bruit GI: Abdomen is soft.   Extremities: No edema.  Full range of motion of lower extremities. Neurologic: Alert and oriented x3.  No focal neurological deficits.    Lab Results:  Data Reviewed: I have personally reviewed following labs and reports of the imaging studies  CBC: Recent Labs  Lab 10/03/23 1638 10/04/23 2109 10/05/23 1714 10/06/23 0421  WBC 8.4 8.7 8.3 8.7  NEUTROABS  --   --  5.8  --   HGB 8.4* 8.6* 8.3* 8.0*  HCT 27.3* 28.3* 27.4* 25.9*  MCV 62.5* 62.9* 63.4* 64.0*  PLT 314 316 309 267    Basic Metabolic Panel: Recent Labs  Lab 10/03/23 1638 10/04/23 2109 10/05/23 1714 10/06/23 0421  NA 129* 133* 134* 132*  K 3.8 3.6 3.9 3.7  CL 104 106 104 109  CO2 15* 18* 20* 19*  GLUCOSE 98 105* 78 86  BUN <5* 6 6 <5*  CREATININE 0.57 0.56 0.63 0.49  CALCIUM 8.7* 8.9 9.2 8.5*    GFR: Estimated Creatinine Clearance: 109.7 mL/min (by C-G formula based on SCr of 0.49 mg/dL).  Liver Function Tests: Recent Labs  Lab 10/03/23 1638 10/04/23 2109 10/05/23 1714 10/06/23 0421  AST 15 17 15  14*  ALT 11 12 11 9   ALKPHOS 32* 30* 30* 25*  BILITOT 0.8 0.7 0.7 0.7  PROT 7.0 7.1 7.0 6.0*  ALBUMIN 3.2* 3.3* 3.2* 2.8*    CBG: Recent Labs  Lab 10/05/23 2030  GLUCAP 89    Anemia Panel: Recent Labs    10/04/23 2109  VITAMINB12 156*  FOLATE 9.6  FERRITIN 3*  TIBC 622*  IRON 19*  RETICCTPCT 1.6    Recent Results (from the past 240 hours)  Wet prep, genital     Status: Abnormal   Collection Time: 10/04/23  8:40 PM  Result Value Ref  Range Status   Yeast Wet Prep HPF POC NONE SEEN NONE SEEN Final   Trich, Wet Prep NONE SEEN NONE SEEN Final   Clue Cells Wet Prep HPF POC NONE SEEN NONE SEEN Final   WBC, Wet Prep HPF POC >=10 (A) <10 Final   Sperm NONE SEEN  Final    Comment: Performed at [facility], [address], [place], [place] [address]      Radiology Studies: ECHOCARDIOGRAM COMPLETE Result Date: 10/06/2023    ECHOCARDIOGRAM REPORT   Patient Name:   Miranda Curtis Date of Exam: 10/06/2023 Medical Rec #:  [id number]    Height:       61.0 in Accession #:    [id number]   Weight:       195.0 lb Date of Birth:  2000-11-09  BSA:          1.869 m Patient Age:    23 years     BP:           99/66 mmHg Patient Gender: F            HR:           100 bpm. Exam Location:  Inpatient Procedure: 2D Echo (Both Spectral and Color Flow Doppler were utilized during            procedure). Indications:    syncope  History:        Patient has no prior history of Echocardiogram examinations.                 Signs/Symptoms:pregnancy.  Sonographer:    Tinnie Barefoot RDCS Referring Phys: [id number] SUBRINA SUNDIL IMPRESSIONS  1. Left ventricular ejection fraction, by estimation, is 65 to 70%. The left ventricle has normal function. The left ventricle has no regional wall motion abnormalities. Left ventricular diastolic parameters were normal.  2. Right ventricular systolic function is normal. The right ventricular size is normal.  3. The mitral valve is normal in structure. No evidence of mitral valve regurgitation. No evidence of mitral stenosis.  4. The aortic valve is tricuspid. Aortic valve regurgitation is not visualized. No aortic stenosis is present.  5. IVC is small suggesting low RA pressure and hypovolemia. FINDINGS  Left Ventricle: Left ventricular ejection fraction, by estimation, is 65 to 70%. The left ventricle has normal function. The left ventricle has no regional wall motion abnormalities. The left ventricular internal cavity size was  normal in size. There is  no left ventricular hypertrophy. Left ventricular diastolic parameters were normal. Right Ventricle: The right ventricular size is normal. Right vetricular wall thickness was not well visualized. Right ventricular systolic function is normal. Left Atrium: Left atrial size was normal in size. Right Atrium: Right atrial size was normal in size. Pericardium: There is no evidence of pericardial effusion. Mitral Valve: The mitral valve is normal in structure. No evidence of mitral valve regurgitation. No evidence of mitral valve stenosis. Tricuspid Valve: The tricuspid valve is normal in structure. Tricuspid valve regurgitation is not demonstrated. No evidence of tricuspid stenosis. Aortic Valve: The aortic valve is tricuspid. Aortic valve regurgitation is not visualized. No aortic stenosis is present. Aortic valve mean gradient measures 4.1 mmHg. Aortic valve peak gradient measures 9.1 mmHg. Aortic valve area, by VTI measures 1.96 cm. Pulmonic Valve: The pulmonic valve was not well visualized. Pulmonic valve regurgitation is trivial. No evidence of pulmonic stenosis. Aorta: The aortic root and ascending aorta are structurally normal, with no evidence of dilitation. Venous: IVC is small suggesting low RA pressure and hypovolemia. IAS/Shunts: No atrial level shunt detected by color flow Doppler.  LEFT VENTRICLE PLAX 2D LVIDd:         4.70 cm   Diastology LVIDs:         2.60 cm   LV e' medial:    12.60 cm/s LV PW:         0.90 cm   LV E/e' medial:  6.5 LV IVS:        1.00 cm   LV e' lateral:   18.30 cm/s LVOT diam:     1.90 cm   LV E/e' lateral: 4.5 LV SV:         46 LV SV Index:   24 LVOT Area:     2.84 cm  RIGHT VENTRICLE  IVC RV Basal diam:  2.40 cm     IVC diam: 1.20 cm RV S prime:     18.00 cm/s TAPSE (M-mode): 2.0 cm LEFT ATRIUM             Index        RIGHT ATRIUM          Index LA diam:        4.10 cm 2.19 cm/m   RA Area:     9.96 cm LA Vol (A2C):   36.5 ml 19.53 ml/m  RA  Volume:   19.90 ml 10.65 ml/m LA Vol (A4C):   35.3 ml 18.89 ml/m LA Biplane Vol: 36.0 ml 19.27 ml/m  AORTIC VALVE AV Area (Vmax):    1.87 cm AV Area (Vmean):   2.03 cm AV Area (VTI):     1.96 cm AV Vmax:           151.15 cm/s AV Vmean:          93.883 cm/s AV VTI:            0.233 m AV Peak Grad:      9.1 mmHg AV Mean Grad:      4.1 mmHg LVOT Vmax:         99.60 cm/s LVOT Vmean:        67.100 cm/s LVOT VTI:          0.161 m LVOT/AV VTI ratio: 0.69  AORTA Ao Root diam: 2.70 cm Ao Asc diam:  2.60 cm MV E velocity: 81.80 cm/s MV A velocity: 63.90 cm/s  SHUNTS MV E/A ratio:  1.28        Systemic VTI:  0.16 m                            Systemic Diam: 1.90 cm Dorn Ross MD Electronically signed by Dorn Ross MD Signature Date/Time: 10/06/2023/4:09:17 PM    Final    CT HEAD WO CONTRAST ( ) Result Date: 10/05/2023 CLINICAL DATA:  Recurrent syncope EXAM: CT HEAD WITHOUT CONTRAST TECHNIQUE: Contiguous axial images were obtained from the base of the skull through the vertex without intravenous contrast. RADIATION DOSE REDUCTION: This exam was performed according to the departmental dose-optimization program which includes automated exposure control, adjustment of the mA and/or kV according to patient size and/or use of iterative reconstruction technique. COMPARISON:  MRI MRV head Aug 20, 2023.  CT head April 26, 24. FINDINGS: Brain: No evidence of acute infarction, hemorrhage, hydrocephalus, extra-axial collection or mass lesion/mass effect. Vascular: No hyperdense vessel. Skull: No acute fracture. Sinuses/Orbits: Clear sinuses.  No acute orbital findings. Other: No mastoid effusions. IMPRESSION: No evidence of acute intracranial abnormality. Electronically Signed   By: Gilmore GORMAN Molt M.D.   On: 10/05/2023 21:09       LOS: 0 days   Landon Bassford  [facility] Pager on [email or web address]  10/07/2023, 9:17 AM

## 2023-10-07 NOTE — TOC Initial Note (Addendum)
 Transition of Care (TOC) - Initial/Assessment Note   Spoke to patient at bedside. Updated address to [address] [place], [place] [address]. She lives with her mother, sisters and children. Phone number [phone]   Independent prior to admission.   OB Dr Ronal Pinal has appointment 10/09/23 at 0930   PCP , patient currently changing from [facility] pediatric clinic to [facility] adult clinic   [facility] will continue to follow .   Patient Details  Name: Miranda Curtis MRN: [id number] Date of Birth: [birth year]  Transition of Care [facility]) CM/SW Contact:    Stephane Powell Jansky, RN Phone Number: 10/07/2023, 10:07 AM  Clinical Narrative:                   Expected Discharge Plan: Home/Self Care Barriers to Discharge: Continued Medical Work up   Patient Goals and CMS Choice Patient states their goals for this hospitalization and ongoing recovery are:: to return to home   Choice offered to / list presented to : NA      Expected Discharge Plan and Services   Discharge Planning Services: CM Consult Post Acute Care Choice: NA Living arrangements for the past 2 months: Single Family Home                 DME Arranged: N/A DME Agency: NA       HH Arranged: NA HH Agency: NA        Prior Living Arrangements/Services Living arrangements for the past 2 months: Single Family Home Lives with:: Parents, Siblings Patient language and need for interpreter reviewed:: Yes Do you feel safe going back to the place where you live?: Yes      Need for Family Participation in Patient Care: Yes (Comment) Care giver support system in place?: Yes (comment)   Criminal Activity/Legal Involvement Pertinent to Current Situation/Hospitalization: No - Comment as needed  Activities of Daily Living   ADL Screening (condition at time of admission) Independently performs ADLs?: Yes (appropriate for developmental age) Is the patient deaf or have difficulty hearing?: No Does the patient have difficulty seeing,  even when wearing glasses/contacts?: No Does the patient have difficulty concentrating, remembering, or making decisions?: No  Permission Sought/Granted   Permission granted to share information with : No              Emotional Assessment Appearance:: Appears stated age Attitude/Demeanor/Rapport: Engaged Affect (typically observed): Appropriate Orientation: : Oriented to Self, Oriented to Place, Oriented to  Time, Oriented to Situation Alcohol / Substance Use: Not Applicable Psych Involvement: No (comment)  Admission diagnosis:  Iron deficiency anemia [D50.9] Second trimester pregnancy [Z34.92] Syncope, unspecified syncope type [R55] Recurrent syncope [R55] Patient Active Problem List   Diagnosis Date Noted   Recurrent syncope 10/05/2023   Second trimester pregnancy 10/05/2023   Vitamin B12 deficiency 10/05/2023   Iron deficiency anemia 10/03/2023   Obesity affecting pregnancy, antepartum 09/16/2023   Short interval between pregnancies affecting pregnancy, antepartum 08/12/2023   Maternal varicella, non-immune 08/12/2023   Supervision of other normal pregnancy, antepartum 07/29/2023   Anxiety and depression 07/29/2023   ADHD 07/29/2023   History of gastric bypass 04/11/2022   PCP:  Pcp, No Pharmacy:   [id number] [place], [place] - [address] AT [facility] [address] [place] [address] Phone: [phone] Fax: [phone]  [facility] - [place], [place] - [address] [place] [address] [place] [address] Phone:  [phone] Fax: [phone]  [facility] [place] [facility] [address] [place] [address] Phone: [phone] Fax: [phone]     Social Drivers of Health (SDOH) Social History: SDOH Screenings   Food Insecurity: No Food Insecurity (10/06/2023)  Housing: Unknown (10/06/2023)  Recent Concern: Housing - High Risk (08/20/2023)   Transportation Needs: No Transportation Needs (10/06/2023)  Utilities: Not At Risk (10/06/2023)  Alcohol Screen: Low Risk  (07/29/2023)  Depression (PHQ2-9): Low Risk  (07/29/2023)  Financial Resource Strain: Low Risk  (07/29/2023)  Physical Activity: Inactive (07/29/2023)  Social Connections: Unknown (10/06/2023)  Stress: Stress Concern Present (07/29/2023)  Tobacco Use: Low Risk  (10/05/2023)  Health Literacy: Inadequate Health Literacy (07/29/2023)   SDOH Interventions:     Readmission Risk Interventions     No data to display

## 2023-10-07 NOTE — Progress Notes (Signed)
 [facility] RN called by patient's primary RN stating that pt has been having increased constant lower abdominal pain throughout the day that she rates 8/10. The patient also recently went to the bathroom and noticed some pink-tinged blood when wiping. This RN at bedside at 2302. Pt states positive fetal movement and no LOF. The pt describes the lower abd pain as constant and sharp shooting pains like round ligament pain to her sides and crampy in lower abdomen. Dr. Izell notified of patient and ordered FHR doppler and 30 minutes of contraction monitoring. FHR 150 BPM. Toco applied at 2310.

## 2023-10-07 NOTE — Telephone Encounter (Signed)
 Patient referred to infusion pharmacy team for ambulatory infusion of IV iron.  Insurance - [employer] of care - Site of care: [facility] Dx code - D50.9  IV Iron Therapy - Venofer 200 mg IV x 4 (patient received 1st dose of 200 mg in ED) Infusion appointments - Scheduling team will schedule patient as soon as possible.    Miranda Curtis, PharmD

## 2023-10-07 NOTE — Progress Notes (Addendum)
  Progress Note  Patient Name: Miranda Curtis Date of Encounter: 10/07/2023 [facility] Cardiologist: None   Interval Summary   Patient states she feels about the same as she did yesterday No acute complaints Still having episodes of dizziness Has been staying in bed primarily however feels that she would be syncopal if she stood up Orthostatics unremarkable  Vital Signs Vitals:   10/07/23 0722 10/07/23 0930 10/07/23 0932 10/07/23 0933  BP: 98/66 100/62 104/65 97/66  Pulse: 92     Resp: 16     Temp: 98.1 F (36.7 C)     TempSrc: Oral     SpO2: 100%     Weight:      Height:        Intake/Output Summary (Last 24 hours) at 10/07/2023 1006 Last data filed at 10/06/2023 1730 Gross per 24 hour  Intake 360 ml  Output --  Net 360 ml      10/05/2023    5:10 PM 09/11/2023   10:05 AM 08/26/2023    4:03 PM  Last 3 Weights  Weight (lbs) 195 lb 195 lb 12.8 oz 199 lb 9.6 oz  Weight (kg) 88.451 kg 88.814 kg 90.538 kg     Telemetry/ECG  Sinus rhythm, HR 90-100s - Personally Reviewed  Physical Exam  GEN: No acute distress.   Neck: No JVD Cardiac: RRR, no murmurs, rubs, or gallops.  Respiratory: Clear to auscultation bilaterally. GI: Soft, nontender, non-distended  MS: No edema  Assessment & Plan  23 y.o. female with a hx of gastric bypass, anxiety, iron deficient anemia, currently [redacted] weeks pregnant who is being seen for syncope in setting of pregnancy  Syncope Patient reports intermittent dizziness/syncope since 2023 Reports that she has syncopal episodes when both pregnant and not pregnant She believes that the episodes become more frequent and severe while pregnant She believes that her dizziness has been a chronic issue She states that the symptoms often occur while standing, some episodes occur while sitting however she has never syncopized while laying flat OB/GYN is following She has been given IV iron/B12 for her deficiency/anemia Hemoglobin today 8.0 down from  8.3 yesterday Orthostatics unremarked Echo this admission showed LVEF 65 to 70%, normal LV function, no valvular abnormalities, IVC suggested hypovolemia Patient educated on oral hydration, compression stockings Continue to monitor on telemetry while inpatient Plan for 2-week cardiac monitor at discharge Will follow-up with Dr. Sheena as outpatient   Per primary Peripheral neuropathy Pregnancy, second trimester  History of gastric bypass Mood disorders Anemia, iron and B12 deficiency   [facility] will sign off.   Medication Recommendations:  No changes Other recommendations (labs, testing, etc):  Zio x 2 weeks on discharge Follow up as an outpatient:  Will schedule with Dr Sheena in cardio-OB clinic    For questions or updates, please contact [facility] Please consult [email or web address] for contact info under       Signed, [name], PA-C   Patient seen and examined.  Agree with above documentation.  On exam, patient is alert and oriented, regular rate and rhythm, no murmurs, lungs CTAB, no LE edema or JVD.  By description of events, suspect vasovagal syncope as describes prodromal symptoms.  Suspect exacerbated by dehydration.  Encouraged to stay hydrated.  Echocardiogram unremarkable.  Telemetry unrevealing, plan Zio x 2 weeks on discharge.  Will schedule follow-up with Dr. Sheena in cardio-OB clinic  Lonni LITTIE Nanas, MD

## 2023-10-07 NOTE — Progress Notes (Signed)
   Ordering 14 day live Zio monitor for patient to wear for syncope. To be read by Dr. Kate. Follow up appointment with Dr. Sheena in our cardio-OB clinic  [name], PA-C 10/07/2023 11:48 AM

## 2023-10-07 NOTE — Progress Notes (Signed)
 FACULTY PRACTICE ANTEPARTUM PROGRESS NOTE  Miranda Curtis is a 23 y.o. [id number] at [redacted]w[redacted]d who is admitted for intermittent syncope.  Estimated Date of Delivery: 02/19/24 Fetal presentation is variable 2/2 gestational age.  Length of Stay:  0 Days. Admitted 10/05/2023  Subjective: Pt seen.  She is stable from an obstetric perspective.  She states she still feels like she may pass out when she gets out of bed.  Pt denies chest pain. Patient reports normal fetal movement.  She denies uterine contractions, denies bleeding and leaking of fluid per vagina.  Vitals:  Blood pressure 97/66, pulse 92, temperature 98.1 F (36.7 C), temperature source Oral, resp. rate 16, height 5' 1 (1.549 m), weight 88.5 kg, SpO2 100%, not currently breastfeeding. Physical Examination: CONSTITUTIONAL: Well-developed, obese, well-nourished female in no acute distress.  HENT:  Normocephalic, atraumatic, External right and left ear normal. Oropharynx is clear and moist EYES: Conjunctivae and EOM are normal.  NECK: Normal range of motion, supple, no masses. SKIN: Skin is warm and dry. No rash noted. Not diaphoretic. No erythema. No pallor. NEUROLGIC: Alert and oriented to person, place, and time. Normal reflexes, muscle tone coordination. No cranial nerve deficit noted. PSYCHIATRIC: Normal mood and affect. Normal behavior. Normal judgment and thought content. CARDIOVASCULAR: Normal heart rate noted, regular rhythm RESPIRATORY: Effort and breath sounds normal, no problems with respiration noted MUSCULOSKELETAL: Normal range of motion. No edema and no tenderness. ABDOMEN: Soft, nontender, nondistended, gravid. CERVIX: deferred  Fetal monitoring: FHR: last documented 155 bpm  Results for orders placed or performed during the hospital encounter of 10/05/23 (from the past 48 hours)  Comprehensive metabolic panel     Status: Abnormal   Collection Time: 10/05/23  5:14 PM  Result Value Ref Range   Sodium 134 (L) 135 - 145  mmol/L   Potassium 3.9 3.5 - 5.1 mmol/L   Chloride 104 98 - 111 mmol/L   CO2 20 (L) 22 - 32 mmol/L   Glucose, Bld 78 70 - 99 mg/dL    Comment: Glucose reference range applies only to samples taken after fasting for at least 8 hours.   BUN 6 6 - 20 mg/dL   Creatinine, Ser 9.36 0.44 - 1.00 mg/dL   Calcium 9.2 8.9 - 89.6 mg/dL   Total Protein 7.0 6.5 - 8.1 g/dL   Albumin 3.2 (L) 3.5 - 5.0 g/dL   AST 15 15 - 41 U/L   ALT 11 0 - 44 U/L   Alkaline Phosphatase 30 (L) 38 - 126 U/L   Total Bilirubin 0.7 0.0 - 1.2 mg/dL   GFR, Estimated >39 >39 mL/min    Comment: (NOTE) Calculated using the CKD-EPI Creatinine Equation (2021)    Anion gap 10 5 - 15    Comment: Performed at [facility], [address] [place], [place] [address]  CBC with Differential     Status: Abnormal   Collection Time: 10/05/23  5:14 PM  Result Value Ref Range   WBC 8.3 4.0 - 10.5 K/uL   RBC 4.32 3.87 - 5.11 MIL/uL   Hemoglobin 8.3 (L) 12.0 - 15.0 g/dL    Comment: Reticulocyte Hemoglobin testing may be clinically indicated, consider ordering this additional test [id number]    HCT 27.4 (L) 36.0 - 46.0 %   MCV 63.4 (L) 80.0 - 100.0 fL   MCH 19.2 (L) 26.0 - 34.0 pg   MCHC 30.3 30.0 - 36.0 g/dL   RDW 81.6 (H) 88.4 - 84.4 %   Platelets 309 150 -  400 K/uL    Comment: REPEATED TO VERIFY   nRBC 0.0 0.0 - 0.2 %   Neutrophils Relative % 70 %   Neutro Abs 5.8 1.7 - 7.7 K/uL   Lymphocytes Relative 21 %   Lymphs Abs 1.8 0.7 - 4.0 K/uL   Monocytes Relative 6 %   Monocytes Absolute 0.5 0.1 - 1.0 K/uL   Eosinophils Relative 2 %   Eosinophils Absolute 0.2 0.0 - 0.5 K/uL   Basophils Relative 1 %   Basophils Absolute 0.0 0.0 - 0.1 K/uL   Immature Granulocytes 0 %   Abs Immature Granulocytes 0.03 0.00 - 0.07 K/uL    Comment: Performed at [facility], [address], [place], [place] [address]  hCG, serum, qualitative     Status: Abnormal   Collection Time: 10/05/23  5:14 PM  Result Value Ref Range   Preg,  Serum POSITIVE (A) NEGATIVE    Comment:        THE SENSITIVITY OF THIS METHODOLOGY IS >10 mIU/mL. Performed at [facility], [address] [place], [place] [address]   Urinalysis, Routine w reflex microscopic -Urine, Clean Catch     Status: Abnormal   Collection Time: 10/05/23  6:59 PM  Result Value Ref Range   Color, Urine YELLOW YELLOW   APPearance CLEAR CLEAR   Specific Gravity, Urine 1.009 1.005 - 1.030   pH 7.0 5.0 - 8.0   Glucose, UA NEGATIVE NEGATIVE mg/dL   Hgb urine dipstick NEGATIVE NEGATIVE   Bilirubin Urine NEGATIVE NEGATIVE   Ketones, ur 5 (A) NEGATIVE mg/dL   Protein, ur NEGATIVE NEGATIVE mg/dL   Nitrite NEGATIVE NEGATIVE   Leukocytes,Ua NEGATIVE NEGATIVE    Comment: Performed at [facility], [address], [place], [place] [address]  CBG monitoring, ED     Status: None   Collection Time: 10/05/23  8:30 PM  Result Value Ref Range   Glucose-Capillary 89 70 - 99 mg/dL    Comment: Glucose reference range applies only to samples taken after fasting for at least 8 hours.  Comprehensive metabolic panel     Status: Abnormal   Collection Time: 10/06/23  4:21 AM  Result Value Ref Range   Sodium 132 (L) 135 - 145 mmol/L   Potassium 3.7 3.5 - 5.1 mmol/L   Chloride 109 98 - 111 mmol/L   CO2 19 (L) 22 - 32 mmol/L   Glucose, Bld 86 70 - 99 mg/dL    Comment: Glucose reference range applies only to samples taken after fasting for at least 8 hours.   BUN <5 (L) 6 - 20 mg/dL   Creatinine, Ser 9.50 0.44 - 1.00 mg/dL   Calcium 8.5 (L) 8.9 - 10.3 mg/dL   Total Protein 6.0 (L) 6.5 - 8.1 g/dL   Albumin 2.8 (L) 3.5 - 5.0 g/dL   AST 14 (L) 15 - 41 U/L   ALT 9 0 - 44 U/L   Alkaline Phosphatase 25 (L) 38 - 126 U/L   Total Bilirubin 0.7 0.0 - 1.2 mg/dL   GFR, Estimated >39 >39 mL/min    Comment: (NOTE) Calculated using the CKD-EPI Creatinine Equation (2021)    Anion gap 4 (L) 5 - 15    Comment: Performed at [facility], [address] [place], [place]  [address]  CBC     Status: Abnormal   Collection Time: 10/06/23  4:21 AM  Result Value Ref Range   WBC 8.7 4.0 - 10.5 K/uL  RBC 4.05 3.87 - 5.11 MIL/uL   Hemoglobin 8.0 (L) 12.0 - 15.0 g/dL    Comment: Reticulocyte Hemoglobin testing may be clinically indicated, consider ordering this additional test [id number]    HCT 25.9 (L) 36.0 - 46.0 %   MCV 64.0 (L) 80.0 - 100.0 fL   MCH 19.8 (L) 26.0 - 34.0 pg   MCHC 30.9 30.0 - 36.0 g/dL   RDW 81.9 (H) 88.4 - 84.4 %   Platelets 267 150 - 400 K/uL    Comment: REPEATED TO VERIFY   nRBC 0.0 0.0 - 0.2 %    Comment: Performed at [facility], [address] [place], [place] [address]  TSH     Status: None   Collection Time: 10/07/23  9:04 AM  Result Value Ref Range   TSH 1.129 0.350 - 4.500 uIU/mL    Comment: Performed by a 3rd Generation assay with a functional sensitivity of <=0.01 uIU/mL. Performed at [facility], [address], [place], [place] [address]   CBC     Status: Abnormal   Collection Time: 10/07/23  9:04 AM  Result Value Ref Range   WBC 7.2 4.0 - 10.5 K/uL   RBC 4.08 3.87 - 5.11 MIL/uL   Hemoglobin 8.0 (L) 12.0 - 15.0 g/dL    Comment: Reticulocyte Hemoglobin testing may be clinically indicated, consider ordering this additional test [id number]    HCT 25.5 (L) 36.0 - 46.0 %   MCV 62.5 (L) 80.0 - 100.0 fL   MCH 19.6 (L) 26.0 - 34.0 pg   MCHC 31.4 30.0 - 36.0 g/dL   RDW 81.7 (H) 88.4 - 84.4 %   Platelets 280 150 - 400 K/uL    Comment: CONSISTENT WITH PREVIOUS RESULT REPEATED TO VERIFY    nRBC 0.4 (H) 0.0 - 0.2 %    Comment: Performed at [facility], [address] [place], [place] [address]  Comprehensive metabolic panel with GFR     Status: Abnormal   Collection Time: 10/07/23  9:04 AM  Result Value Ref Range   Sodium 135 135 - 145 mmol/L   Potassium 3.7 3.5 - 5.1 mmol/L   Chloride 109 98 - 111 mmol/L   CO2 20 (L) 22 - 32 mmol/L   Glucose, Bld 97 70 - 99 mg/dL    Comment: Glucose reference range  applies only to samples taken after fasting for at least 8 hours.   BUN 5 (L) 6 - 20 mg/dL   Creatinine, Ser 9.33 0.44 - 1.00 mg/dL   Calcium 8.8 (L) 8.9 - 10.3 mg/dL   Total Protein 6.4 (L) 6.5 - 8.1 g/dL   Albumin 2.9 (L) 3.5 - 5.0 g/dL   AST 16 15 - 41 U/L   ALT 9 0 - 44 U/L   Alkaline Phosphatase 31 (L) 38 - 126 U/L   Total Bilirubin 0.3 0.0 - 1.2 mg/dL   GFR, Estimated >39 >39 mL/min    Comment: (NOTE) Calculated using the CKD-EPI Creatinine Equation (2021)    Anion gap 6 5 - 15    Comment: Performed at [facility], [address] [place], [place] [address]  Magnesium      Status: None   Collection Time: 10/07/23  9:04 AM  Result Value Ref Range   Magnesium  1.7 1.7 - 2.4 mg/dL    Comment: Performed at [facility], [address], [place], [place] [address]    I have reviewed the patient's current medications.  ASSESSMENT: Principal Problem:   Recurrent syncope Active Problems:   History of gastric bypass   Anxiety and depression   ADHD   Iron deficiency anemia   Second trimester pregnancy   Vitamin B12 deficiency   PLAN: 20 week pregnancy: Continue routine prenatal care, Daily FHT  Syncope:   Treatment per cardiology and hospitalist team. Spoke with nurse, pt has received oral B12 but as not received inpatient iron infusion. Per notes looks like pharmacy team is scheduling outpatient iron infusion sequence.  Pt is on oral iron currently.   Continue routine antenatal care.   Jerilynn Buddle, MD [facility] Faculty Attending, [facility] 10/07/2023 11:02 AM

## 2023-10-07 NOTE — Telephone Encounter (Signed)
 Auth Submission: NO AUTH NEEDED Site of care: Site of care: [facility] Payer: [employer] med[employer] Medication & CPT/J Code(s) submitted: Venofer (Iron Sucrose) J1756 Diagnosis Code:  Route of submission (phone, fax, portal):  Phone # Fax # Auth type: Buy/Bill PB Units/visits requested: 200mg  x 4 doses Reference number:  Approval from: 10/07/23 to 02/06/24

## 2023-10-08 ENCOUNTER — Observation Stay (HOSPITAL_BASED_OUTPATIENT_CLINIC_OR_DEPARTMENT_OTHER): Payer: MEDICAID

## 2023-10-08 ENCOUNTER — Inpatient Hospital Stay (HOSPITAL_COMMUNITY)
Admit: 2023-10-08 | Discharge: 2023-10-08 | Disposition: A | Discharge status: Home / Self Care | Payer: MEDICAID | Attending: Cardiology | Admitting: Cardiology

## 2023-10-08 DIAGNOSIS — O99842 Bariatric surgery status complicating pregnancy, second trimester: Secondary | ICD-10-CM | POA: Diagnosis not present

## 2023-10-08 DIAGNOSIS — O9A212 Injury, poisoning and certain other consequences of external causes complicating pregnancy, second trimester: Secondary | ICD-10-CM

## 2023-10-08 DIAGNOSIS — O99212 Obesity complicating pregnancy, second trimester: Secondary | ICD-10-CM

## 2023-10-08 DIAGNOSIS — E669 Obesity, unspecified: Secondary | ICD-10-CM | POA: Diagnosis not present

## 2023-10-08 DIAGNOSIS — R55 Syncope and collapse: Secondary | ICD-10-CM | POA: Diagnosis not present

## 2023-10-08 DIAGNOSIS — O09892 Supervision of other high risk pregnancies, second trimester: Secondary | ICD-10-CM

## 2023-10-08 DIAGNOSIS — Z3A20 20 weeks gestation of pregnancy: Secondary | ICD-10-CM

## 2023-10-08 LAB — URINE CYTOLOGY ANCILLARY ONLY
Chlamydia: NEGATIVE
Comment: NEGATIVE
Comment: NORMAL
Neisseria Gonorrhea: NEGATIVE

## 2023-10-08 MED ORDER — CYANOCOBALAMIN 500 MCG PO TABS: 500.0000 ug | ORAL_TABLET | Freq: Every day | ORAL | 3 refills | Status: DC

## 2023-10-08 MED ORDER — COMPLETENATE 29-1 MG PO CHEW: 1.0000 | CHEWABLE_TABLET | Freq: Every day | ORAL | 2 refills | Status: DC

## 2023-10-08 NOTE — Progress Notes (Addendum)
 RROB in to see pt who is G6P2 at 20w 6d and is inpatient for syncopal episodes. Pt has no OB complaints at this time and is about to d/c home.  FHR is dopplered at 157bpm.  Dr Abigail made aware and has no further orders at this time.

## 2023-10-08 NOTE — Plan of Care (Signed)
  Problem: Pain Managment: Goal: General experience of comfort will improve and/or be controlled Outcome: Progressing   Problem: Safety: Goal: Ability to remain free from injury will improve Outcome: Progressing

## 2023-10-08 NOTE — Progress Notes (Signed)
 Toco removed at 2340. No UC's noted. Dr. Izell notified at 2341. Reported to primary RN that Dr. Izell will place orders for G/C urine to be collected and a transvaginal ultrasound. Continue daily FHR dopplers.

## 2023-10-08 NOTE — Discharge Summary (Signed)
 [facility]  Physician Discharge Summary   Patient ID: Miranda Curtis MRN: [id number] DOB/AGE: 01-07-2001 23 y.o.  Admit date: 10/05/2023 Discharge date: 10/08/2023    PCP: Pcp, No  DISCHARGE DIAGNOSES:    Recurrent syncope Orthostatic hypotension   History of gastric bypass   Anxiety and depression   ADHD   Iron deficiency anemia   Second trimester pregnancy   Vitamin B12 deficiency   [redacted] weeks gestation of pregnancy   RECOMMENDATIONS FOR OUTPATIENT FOLLOW UP: Patient instructed to follow-up with her OB/GYN Patient instructed to keep TED stockings on during awake hours.   Home Health: None Equipment/Devices: None  CODE STATUS: Full code  DISCHARGE CONDITION: fair  Diet recommendation: As before  INITIAL HISTORY: 23 y.o. female history of gastric bypass surgery, ADHD, iron deficiency anemia, vitamin B12 deficiency, panic disorder and generalized disorder G6P2 currently [redacted]w[redacted]d presented to emergency department complaining of recurrent syncope.  Patient was admitted by OB/GYN team for management of recurrent syncope and discharged 6/28 .  While patient was admitted by OB/GYN team further workup revealed recurrent syncope in the setting of low vitamin B12 deficiency and low iron.  Patient was discharged by the OB/GYN team referred for outpatient IV iron infusion in 2 weeks. Patient also seen by neurology for recurrent syncope and paresthesia in the setting of vitamin B12 deficiency. Patient has had at least 3 episodes of syncope.  Some of the episodes are concerning for arrhythmia since it occurred without any prodrome.  She was hospitalized again for further evaluation.   Consultants: OB/GYN.  Cardiology   Procedures: Echocardiogram  HOSPITAL COURSE:   Recurrent syncope/orthostatic hypotension Likely multifactorial including orthostatic hypotension, iron deficiency and B12 deficiency.  Some of the features of her syncope were concerning for arrhythmias.  Patient was  monitored on telemetry.  She was seen by cardiology.  She underwent echocardiogram.  No QT prolongation was noted on EKG.  Echocardiogram showed normal LVEF without any significant valvular abnormalities. Zio patch was placed at discharge. TED stockings were applied.  Cortisol level was normal.  CT head was unremarkable. If patient continues to be symptomatic then the only other option would be to consider pharmacological treatment.  Midodrine was discussed with the OB/GYN prefers not to use this medicine currently due to her pregnant status.  All of this was discussed with the patient.  She was told to get up early from sitting and lying position.  She has been able to ambulate without much difficulty in the hospital.  Considered to be safe for discharge at this time.     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   History of gastric bypass surgery Stable.   B12 deficiency/iron deficiency/microcytic anemia Receiving B12 supplementation.  Receiving iron supplementation.   Second trimester pregnancy Seen by OB during this hospital stay.  Please review the notes for details.   Discussed with Dr. Abigail this morning prior to discharge.  Okay from [facility] standpoint for discharge.     History of anxiety depression ADHD Currently not on any medications for same.  Patient is stable.  Okay for discharge home today.   PERTINENT LABS:  The results of significant diagnostics from this hospitalization (including imaging, microbiology, ancillary and laboratory) are listed below for reference.    Microbiology: Recent Results (from the past 240 hours)  Wet prep, genital     Status: Abnormal   Collection Time: 10/04/23  8:40 PM  Result Value Ref Range Status   Yeast Wet Prep HPF POC NONE  SEEN NONE SEEN Final   Trich, Wet Prep NONE SEEN NONE SEEN Final   Clue Cells Wet Prep HPF POC NONE SEEN NONE SEEN Final   WBC, Wet Prep HPF POC >=10 (A) <10 Final   Sperm NONE SEEN  Final    Comment:  Performed at [facility], [address], [place], [place] [address]     Labs:   Basic Metabolic Panel: Recent Labs  Lab 10/03/23 1638 10/04/23 2109 10/05/23 1714 10/06/23 0421 10/07/23 0904  NA 129* 133* 134* 132* 135  K 3.8 3.6 3.9 3.7 3.7  CL 104 106 104 109 109  CO2 15* 18* 20* 19* 20*  GLUCOSE 98 105* 78 86 97  BUN <5* 6 6 <5* 5*  CREATININE 0.57 0.56 0.63 0.49 0.66  CALCIUM 8.7* 8.9 9.2 8.5* 8.8*  MG  --   --   --   --  1.7   Liver Function Tests: Recent Labs  Lab 10/03/23 1638 10/04/23 2109 10/05/23 1714 10/06/23 0421 10/07/23 0904  AST 15 17 15  14* 16  ALT 11 12 11 9 9   ALKPHOS 32* 30* 30* 25* 31*  BILITOT 0.8 0.7 0.7 0.7 0.3  PROT 7.0 7.1 7.0 6.0* 6.4*  ALBUMIN 3.2* 3.3* 3.2* 2.8* 2.9*    CBC: Recent Labs  Lab 10/03/23 1638 10/04/23 2109 10/05/23 1714 10/06/23 0421 10/07/23 0904  WBC 8.4 8.7 8.3 8.7 7.2  NEUTROABS  --   --  5.8  --   --   HGB 8.4* 8.6* 8.3* 8.0* 8.0*  HCT 27.3* 28.3* 27.4* 25.9* 25.5*  MCV 62.5* 62.9* 63.4* 64.0* 62.5*  PLT 314 316 309 267 280     CBG: Recent Labs  Lab 10/05/23 2030  GLUCAP 89     IMAGING STUDIES US  MFM OB LIMITED Result Date: 10/08/2023 ----------------------------------------------------------------------  OBSTETRICS REPORT                       (Signed Final 10/08/2023 10:06 am) ---------------------------------------------------------------------- Patient Info  ID #:       [id number]                          D.O.B.:  2001/01/24 (23 yrs)(F)  Name:       Miranda Curtis                    Visit Date: 10/08/2023 01:04 am ---------------------------------------------------------------------- Performed By  Attending:        Delora Smaller DO       Ref. Address:     [address]                                                             [address]  [place], [place]                                                              [address]  Performed By:     Miranda Curtis          [facility]  Referred By:      [facility]                    [place] [facility]                    [place] ---------------------------------------------------------------------- Orders  #  Description                           Code        Ordered By  1  US  MFM OB LIMITED                     [id number]    CHARLIE PICKENS ----------------------------------------------------------------------  #  Order #                     Accession #                Episode #  1  [id number]                   [id number]                 [id number] ---------------------------------------------------------------------- Indications  Traumatic injury during pregnancy (fall from   O9A.219 T14.90  syncope episode)  Obesity complicating pregnancy, second         O99.212  trimester (BMI 37)  Pregnancy complicated by previous bariatric    O99.842  surgery antepartum, second trimester  Short interval between pregnancies, 2nd        O09.892  trimester (11/22/22)  Anxiety during pregnancy, second trimester     O99.342, F41.9  [redacted] weeks gestation of pregnancy                Z3A.20 ---------------------------------------------------------------------- Fetal Evaluation  Num Of Fetuses:         1  Fetal Heart Rate(bpm):  150  Cardiac Activity:       Observed  Presentation:           Breech  Placenta:               Anterior Fundal  P. Cord Insertion:      Visualized, central  Amniotic Fluid  AFI FV:      Within normal limits                              Largest Pocket(cm)  5.4  Comment:    Stomach, bladder, diaphragm noted. No placental abruption or              previa identified. ---------------------------------------------------------------------- OB History  Blood Type:   AB+  Gravidity:    6         Term:   2         SAB:   3  Living:       2  ---------------------------------------------------------------------- Gestational Age  Best:          20w 6d     Det. By:  Early Ultrasound         EDD:   02/19/24                                      (06/24/23) ---------------------------------------------------------------------- Cervix Uterus Adnexa  Cervix  Length:            5.7  cm.  Normal appearance by transabdominal scan  Uterus  No abnormality visualized.  Right Ovary  Within normal limits.  Left Ovary  Within normal limits.  Adnexa  No abnormality visualized ---------------------------------------------------------------------- Comments  Hospital Ultrasound  The patient is admitted for intermittent syncope. An  ultrasound was ordered.  Sonographic findings  Single intrauterine pregnancy at 20w 6d  Fetal cardiac activity: Observed and appears normal.  Presentation: Breech.  Limited fetal anatomy appears normal.  Amniotic fluid: Within normal limits.  MVP: 5.4 cm.  Placenta: Anterior Fundal. There is no sonographic evidence  of bleeding.  Recommendations  - EDD is 02/19/2024 dated by Early Ultrasound  (06/24/23).  - Continue clinical management per OB provider.  This was a limited ultrasound with a remote read. If an official  MFM consult is requested for any reason please call/place an  order in Epic. ----------------------------------------------------------------------                 Miranda Smaller, DO Electronically Signed Final Report   10/08/2023 10:06 am ----------------------------------------------------------------------   ECHOCARDIOGRAM COMPLETE Result Date: 10/06/2023    ECHOCARDIOGRAM REPORT   Patient Name:   ELIANI LECLERE Date of Exam: 10/06/2023 Medical Rec #:  [id number]    Height:       61.0 in Accession #:    [id number]   Weight:       195.0 lb Date of Birth:  11-27-2000    BSA:          1.869 m Patient Age:    23 years     BP:           99/66 mmHg Patient Gender: F            HR:           100 bpm. Exam Location:  Inpatient Procedure: 2D  Echo (Both Spectral and Color Flow Doppler were utilized during            procedure). Indications:    syncope  History:        Patient has no prior history of Echocardiogram examinations.                 Signs/Symptoms:pregnancy.  Sonographer:    Tinnie Barefoot RDCS Referring Phys: [id number] SUBRINA SUNDIL IMPRESSIONS  1. Left ventricular ejection fraction, by estimation, is 65 to 70%. The left ventricle has normal function. The left ventricle has no regional wall motion abnormalities. Left ventricular diastolic parameters were normal.  2. Right  ventricular systolic function is normal. The right ventricular size is normal.  3. The mitral valve is normal in structure. No evidence of mitral valve regurgitation. No evidence of mitral stenosis.  4. The aortic valve is tricuspid. Aortic valve regurgitation is not visualized. No aortic stenosis is present.  5. IVC is small suggesting low RA pressure and hypovolemia. FINDINGS  Left Ventricle: Left ventricular ejection fraction, by estimation, is 65 to 70%. The left ventricle has normal function. The left ventricle has no regional wall motion abnormalities. The left ventricular internal cavity size was normal in size. There is  no left ventricular hypertrophy. Left ventricular diastolic parameters were normal. Right Ventricle: The right ventricular size is normal. Right vetricular wall thickness was not well visualized. Right ventricular systolic function is normal. Left Atrium: Left atrial size was normal in size. Right Atrium: Right atrial size was normal in size. Pericardium: There is no evidence of pericardial effusion. Mitral Valve: The mitral valve is normal in structure. No evidence of mitral valve regurgitation. No evidence of mitral valve stenosis. Tricuspid Valve: The tricuspid valve is normal in structure. Tricuspid valve regurgitation is not demonstrated. No evidence of tricuspid stenosis. Aortic Valve: The aortic valve is tricuspid. Aortic valve regurgitation  is not visualized. No aortic stenosis is present. Aortic valve mean gradient measures 4.1 mmHg. Aortic valve peak gradient measures 9.1 mmHg. Aortic valve area, by VTI measures 1.96 cm. Pulmonic Valve: The pulmonic valve was not well visualized. Pulmonic valve regurgitation is trivial. No evidence of pulmonic stenosis. Aorta: The aortic root and ascending aorta are structurally normal, with no evidence of dilitation. Venous: IVC is small suggesting low RA pressure and hypovolemia. IAS/Shunts: No atrial level shunt detected by color flow Doppler.  LEFT VENTRICLE PLAX 2D LVIDd:         4.70 cm   Diastology LVIDs:         2.60 cm   LV e' medial:    12.60 cm/s LV PW:         0.90 cm   LV E/e' medial:  6.5 LV IVS:        1.00 cm   LV e' lateral:   18.30 cm/s LVOT diam:     1.90 cm   LV E/e' lateral: 4.5 LV SV:         46 LV SV Index:   24 LVOT Area:     2.84 cm  RIGHT VENTRICLE             IVC RV Basal diam:  2.40 cm     IVC diam: 1.20 cm RV S prime:     18.00 cm/s TAPSE (M-mode): 2.0 cm LEFT ATRIUM             Index        RIGHT ATRIUM          Index LA diam:        4.10 cm 2.19 cm/m   RA Area:     9.96 cm LA Vol (A2C):   36.5 ml 19.53 ml/m  RA Volume:   19.90 ml 10.65 ml/m LA Vol (A4C):   35.3 ml 18.89 ml/m LA Biplane Vol: 36.0 ml 19.27 ml/m  AORTIC VALVE AV Area (Vmax):    1.87 cm AV Area (Vmean):   2.03 cm AV Area (VTI):     1.96 cm AV Vmax:           151.15 cm/s AV Vmean:  93.883 cm/s AV VTI:            0.233 m AV Peak Grad:      9.1 mmHg AV Mean Grad:      4.1 mmHg LVOT Vmax:         99.60 cm/s LVOT Vmean:        67.100 cm/s LVOT VTI:          0.161 m LVOT/AV VTI ratio: 0.69  AORTA Ao Root diam: 2.70 cm Ao Asc diam:  2.60 cm MV E velocity: 81.80 cm/s MV A velocity: 63.90 cm/s  SHUNTS MV E/A ratio:  1.28        Systemic VTI:  0.16 m                            Systemic Diam: 1.90 cm Dorn Ross MD Electronically signed by Dorn Ross MD Signature Date/Time: 10/06/2023/4:09:17 PM    Final     CT HEAD WO CONTRAST ( ) Result Date: 10/05/2023 CLINICAL DATA:  Recurrent syncope EXAM: CT HEAD WITHOUT CONTRAST TECHNIQUE: Contiguous axial images were obtained from the base of the skull through the vertex without intravenous contrast. RADIATION DOSE REDUCTION: This exam was performed according to the departmental dose-optimization program which includes automated exposure control, adjustment of the mA and/or kV according to patient size and/or use of iterative reconstruction technique. COMPARISON:  MRI MRV head Aug 20, 2023.  CT head April 26, 24. FINDINGS: Brain: No evidence of acute infarction, hemorrhage, hydrocephalus, extra-axial collection or mass lesion/mass effect. Vascular: No hyperdense vessel. Skull: No acute fracture. Sinuses/Orbits: Clear sinuses.  No acute orbital findings. Other: No mastoid effusions. IMPRESSION: No evidence of acute intracranial abnormality. Electronically Signed   By: Gilmore GORMAN Molt M.D.   On: 10/05/2023 21:09   US  MFM OB DETAIL +14 WK Result Date: 09/26/2023 ----------------------------------------------------------------------  OBSTETRICS REPORT                       (Signed Final 09/26/2023 10:30 am) ---------------------------------------------------------------------- Patient Info  ID #:       [id number]                          D.O.B.:  November 04, 2000 (23 yrs)(F)  Name:       Miranda Curtis                    Visit Date: 09/26/2023 09:18 am ---------------------------------------------------------------------- Performed By  Attending:        Delora Smaller DO       Ref. Address:     [address]                                                             [address]  [place], [place]                                                             [address]  Performed By:     Vinetta Dawn RDMS      [facility]  Referred By:      [facility]                    [facility]                    [place] ---------------------------------------------------------------------- Orders  #  Description                           Code        Ordered By  1  US  MFM OB DETAIL +14 WK               76811.01    [facility] MILLER ----------------------------------------------------------------------  #  Order #                     Accession #                Episode #  1  [id number]                   [id number]                 [id number] ---------------------------------------------------------------------- Indications  Obesity complicating pregnancy, second         O99.212  trimester (BMI 37)  Pregnancy complicated by previous bariatric    O99.842  surgery antepartum, second trimester  Short interval between pregnancies, 2nd        O09.892  trimester (11/22/22)  Anxiety during pregnancy, second trimester     O99.342, F41.9  Encounter for antenatal screening for          Z36.3  malformations  [redacted] weeks gestation of pregnancy                Z3A.19  Low risk NIPS  Neg AFP ---------------------------------------------------------------------- Vital Signs  BP:  112/57 ---------------------------------------------------------------------- Fetal Evaluation  Num Of Fetuses:         1  Fetal Heart Rate(bpm):  160  Cardiac Activity:       Observed  Presentation:           Cephalic  Placenta:               Fundal  P. Cord Insertion:      Visualized, central  Amniotic Fluid  AFI FV:      Within normal limits                              Largest Pocket(cm)                              5.2 ---------------------------------------------------------------------- Biometry  BPD:      46.2  mm     G. Age:  20w 0d         82  %    CI:        74.19   %    70 - 86                                                           FL/HC:      17.6   %    16.1 - 18.3  HC:      170.3  mm     G. Age:  19w 4d         67  %    HC/AC:      1.17        1.09 - 1.39  AC:      145.1  mm     G. Age:  19w 6d         69  %    FL/BPD:     64.9   %  FL:         30  mm     G. Age:  19w 2d         48  %    FL/AC:      20.7   %    20 - 24  HUM:      29.3  mm     G. Age:  19w 4d         61  %  CER:      19.6  mm     G. Age:  19w 0d         37  %  NFT:       5.1  mm  LV:        9.5  mm  CM:        4.2  mm  Est. FW:     301  gm    0 lb 11 oz      72  % ---------------------------------------------------------------------- OB History  Blood Type:   AB+  Gravidity:    6         Term:   2         SAB:   3  Living:       2 ---------------------------------------------------------------------- Gestational Age  U/S Today:     19w 5d  EDD:   02/15/24  Best:          19w 1d     Det. By:  Early Ultrasound         EDD:   02/19/24                                      (06/24/23) ---------------------------------------------------------------------- Targeted Anatomy  Central Nervous System  Calvarium/Cranial V.:  Appears normal         Cereb./Vermis:          Appears normal  Cavum:                 Appears normal         Cisterna Magna:         Appears normal  Lateral Ventricles:    Appears normal         Midline Falx:           Appears normal  Choroid Plexus:        Appears normal  Spine  Cervical:              Appears normal         Sacral:                 Appears normal  Thoracic:              Appears normal         Shape/Curvature:        Appears normal  Lumbar:                Appears normal  Head/Neck  Lips:                  Appears normal         Profile:                Appears normal  Neck:                  Appears normal         Orbits/Eyes:            Not well visualized  Nuchal Fold:           Appears normal         Mandible:               Not well visualized  Nasal Bone:             Present                Maxilla:                Not well visualized  Thorax  4 Chamber View:        Appears normal         Interventr. Septum:     Appears normal  Cardiac Rhythm:        Normal                 Cardiac Axis:           Normal  Cardiac Situs:         Appears normal         Diaphragm:              Not well visualized  Rt Outflow Tract:      Appears normal  3 Vessel View:          Appears normal  Lt Outflow Tract:      Appears normal         3 V Trachea View:       Appears normal  Aortic Arch:           Appears normal         IVC:                    Appears normal  Ductal Arch:           Appears normal         Crossing:               Appears normal  SVC:                   Appears normal  Abdomen  Ventral Wall:          Appears normal         Lt Kidney:              Appears normal  Cord Insertion:        Appears normal         Rt Kidney:              Appears normal  Situs:                 Appears normal         Bladder:                Appears normal  Stomach:               Appears normal  Extremities  Lt Humerus:            Appears normal         Lt Femur:               Appears normal  Rt Humerus:            Appears normal         Rt Femur:               Appears normal  Lt Forearm:            Appears normal         Lt Lower Leg:           Appears normal  Rt Forearm:            Appears normal         Rt Lower Leg:           Appears normal  Lt Hand:               Open hand nml          Lt Foot:                Nml heel/foot  Rt Hand:               Open hand nml          Rt Foot:                Nml heel/foot  Other  Umbilical Cord:        Normal 3-vessel        Genitalia:              Female-nml ---------------------------------------------------------------------- Cervix Uterus Adnexa  Cervix  Length:  4.5  cm.  Normal appearance by transabdominal scan  Uterus  No abnormality visualized.  Right Ovary  Size(cm)     3.78   x   1.91   x  2.14      Vol(ml): 8.09  Within normal limits.  Left Ovary   Size(cm)     3.96   x   2.75   x  1.85      Vol(ml): 10.55  Within normal limits.  Cul De Sac  No free fluid seen.  Adnexa  No abnormality visualized ---------------------------------------------------------------------- Comments  Maternal Fetal Medicine Consult  JANACE DECKER is a 23 y.o. H3E7967 at [redacted]w[redacted]d here for  ultrasound and consultation. NIPS: Low risk. Fetal sex:  Female. She has no acute concerns. 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  We discussed the potential impact  in pregnancy and the need to monitor nutrient labs.  The  patient has had no complications with this and she is able to  tolerate some level of glucose but was not able to finish her  glucose challenge test in her previous pregnancy.  She is  going to do the jellybean test instead.  Short interval pregnancy: I discussed the potential pregnancy  complications associated with this including preterm birth.  The cervical length appears normal.  She denies excessive  pelvic pain or pressure and feels overall well.  We discussed  the importance of monitoring for signs and symptoms of  preterm labor.  Elevated BMI: I discussed the potential complications  associated with obesity in pregnancy.  These complications  include but are not limited to increased risk of excessive  maternal weight gain, fetal growth abnormalities, fetal  congenital disorders, inability to visualize fetal anatomic  structures on ultrasound, gestational diabetes, hypertensive  disorders of pregnancy, operative birth including cesarean  delivery or assisted vaginal delivery, delayed wound healing  and many long-term health complications.  I discussed the  need for continued growth ultrasounds and possibly antenatal  testing depending upon how the pregnancy course  progresses.  Maternal weight gain should be limited to 10 to  20 pounds during the pregnancy.  While normal weight loss  may  occur during the first and early second trimester, efforts  to actively lose weight with the use of medication is not  recommended during pregnancy.  A whole food diet and  regular exercise of at least 15 to 30 minutes of moderately  strenuous activity is recommended in the absence of any  contraindications. Weight loss with the use of medications is  not recommended during pregnancy. I discussed the risk and  impact of preeclampsia on her pregnancy and the role of  baseline laboratory assessment of kidney, liver and platelet  count as well as the role of low dose Aspirin  to the reduce the  risk of developing preeclampsia. I reassured the patient that  we expect a favorable pregnancy outcome but due to her  pregnancy complications she will need a higher level of  monitoring for her pregnancy compared to a pregnancy  without complications. The patient had time to ask questions  that were answered to her satisfaction. She verbalized  understanding of our discussion and agreed to proceed with  the plan outlined in the recommendations.  Sonographic findings  Single intrauterine pregnancy at 19w 1d.  Fetal cardiac activity:  Observed and appears normal.  Presentation: Cephalic.  The anatomic structures  that were well seen appear normal  without evidence of soft markers. The anatomic survey is  complete.  Fetal biometry shows the estimated fetal weight at the 72  percentile.  Amniotic fluid: Within normal limits.  MVP: 5.2 cm.  Placenta: Fundal.  Adnexa: No abnormality visualized.  Cervical length: 4.5 cm.  There are limitations of prenatal ultrasound such as the  inability to detect certain abnormalities due to poor  visualization. Various factors such as fetal position,  gestational age and maternal body habitus may increase the  difficulty in visualizing the fetal anatomy.  Recommendations  - EDD should be 02/19/2024 based on  Early Ultrasound  (06/24/23).  - Anatomy ultrasound was done today with the above findings   (see report).  - Aspirin  81-162 mg from 12 weeks and continued throughout  the pregnancy for preeclampsia prophylaxis.  - Baseline labs: CMP, CBC, urine protein creatinine ratio.  - Blood pressure goal of < 140 systolic and < 90 diastolic.  Antihypertensive medication should be added/adjusted until  BP goal is achieved.  - Assess nutrient labs for bypass surgery every trimester  including: Folate, B12, ferritin, vitamin D .  - Serial growth ultrasounds every 4-6 weeks starting at 28  weeks until delivery.  - Delivery likely around [redacted] weeks gestation or sooner if  indicated. ----------------------------------------------------------------------                  Miranda Smaller, DO Electronically Signed Final Report   09/26/2023 10:30 am ----------------------------------------------------------------------    DISCHARGE EXAMINATION: Vitals:   10/07/23 1900 10/07/23 2121 10/08/23 0433 10/08/23 0753  BP: 115/73 130/70 102/63 98/63  Pulse: 99 71 99 88  Resp: 19 17 18 18   Temp: 98.7 F (37.1 C) (!) 97.4 F (36.3 C) 98.9 F (37.2 C) 98.5 F (36.9 C)  TempSrc: Oral Oral Oral Oral  SpO2:  97% 100% 100%  Weight:      Height:       General appearance: Awake alert.  In no distress Resp: Clear to auscultation bilaterally.  Normal effort Cardio: S1-S2 is normal regular.  No S3-S4.  No rubs murmurs or bruit   DISPOSITION: Home  Discharge Instructions     Amb Referral to Intravenous Iron Therapy   Complete by: As directed    You have been referred to [facility] Infusion team for IV Iron Infusions. The infusion pharmacy team will reach out to you with appointment information.    Primary Diagnosis Code for IV Iron: D50.9 - Iron deficiency Anemia   Secondary diagnosis code for IV iron: Other   Comment: OB   Call MD for:  difficulty breathing, headache or visual disturbances   Complete by: As directed    Call MD for:  extreme fatigue   Complete by: As directed    Call MD for:  persistant dizziness or  light-headedness   Complete by: As directed    Call MD for:  persistant nausea and vomiting   Complete by: As directed    Call MD for:  severe uncontrolled pain   Complete by: As directed    Call MD for:  temperature >100.4   Complete by: As directed    Diet general   Complete by: As directed    Discharge instructions   Complete by: As directed    Please follow-up with your OB/GYN.  Please get up slowly from a sitting or lying position to avoid sudden drop in blood pressures.  Please make sure you wear the compression stockings during  the daytime.  You may remove them when you are lying on the bed at nighttime.  You were cared for by a hospitalist during your hospital stay. If you have any questions about your discharge medications or the care you received while you were in the hospital after you are discharged, you can call the unit and asked to speak with the hospitalist on call if the hospitalist that took care of you is not available. Once you are discharged, your primary care physician will handle any further medical issues. Please note that NO REFILLS for any discharge medications will be authorized once you are discharged, as it is imperative that you return to your primary care physician (or establish a relationship with a primary care physician if you do not have one) for your aftercare needs so that they can reassess your need for medications and monitor your lab values. If you do not have a primary care physician, you can call [phone] for a physician referral.   Increase activity slowly   Complete by: As directed        Allergies as of 10/08/2023       Reactions   Reglan  [metoclopramide ] Anxiety, Other (See Comments)   Severe panic attack        Medication List     TAKE these medications    ACCRUFeR 30 MG Caps Generic drug: Ferric Maltol Take 1 capsule (30 mg total) by mouth 2 (two) times daily at 8 am and 10 pm.   aspirin  EC 81 MG tablet Take 1 tablet (81 mg total)  by mouth daily. Swallow whole.   cyanocobalamin  500 MCG tablet Commonly known as: VITAMIN B12 Take 1 tablet (500 mcg total) by mouth daily.   cyclobenzaprine  10 MG tablet Commonly known as: FLEXERIL  Take 1 tablet (10 mg total) by mouth 2 (two) times daily as needed.   ondansetron  4 MG disintegrating tablet Commonly known as: ZOFRAN -ODT Take 1 tablet (4 mg total) by mouth every 8 (eight) hours as needed.   prenatal vitamin w/FE, FA 29-1 MG Chew chewable tablet Chew 1 tablet by mouth daily at 12 noon.           TOTAL DISCHARGE TIME: 35 minutes  Valree Feild [employer] on [email or web address]  10/09/2023, 10:55 AM

## 2023-10-09 ENCOUNTER — Encounter (HOSPITAL_BASED_OUTPATIENT_CLINIC_OR_DEPARTMENT_OTHER): Payer: MEDICAID | Admitting: Obstetrics & Gynecology

## 2023-10-10 ENCOUNTER — Ambulatory Visit (INDEPENDENT_AMBULATORY_CARE_PROVIDER_SITE_OTHER): Payer: MEDICAID | Admitting: Obstetrics & Gynecology

## 2023-10-10 ENCOUNTER — Encounter: Payer: Self-pay | Admitting: Obstetrics and Gynecology

## 2023-10-10 VITALS — BP 112/73 | HR 106 | Wt 195.6 lb

## 2023-10-10 DIAGNOSIS — O0992 Supervision of high risk pregnancy, unspecified, second trimester: Secondary | ICD-10-CM

## 2023-10-10 DIAGNOSIS — Z9884 Bariatric surgery status: Secondary | ICD-10-CM | POA: Diagnosis not present

## 2023-10-10 DIAGNOSIS — O09899 Supervision of other high risk pregnancies, unspecified trimester: Secondary | ICD-10-CM

## 2023-10-10 DIAGNOSIS — D509 Iron deficiency anemia, unspecified: Secondary | ICD-10-CM

## 2023-10-10 DIAGNOSIS — Z3A21 21 weeks gestation of pregnancy: Secondary | ICD-10-CM

## 2023-10-10 DIAGNOSIS — E538 Deficiency of other specified B group vitamins: Secondary | ICD-10-CM

## 2023-10-10 DIAGNOSIS — Z3492 Encounter for supervision of normal pregnancy, unspecified, second trimester: Secondary | ICD-10-CM

## 2023-10-10 DIAGNOSIS — O09892 Supervision of other high risk pregnancies, second trimester: Secondary | ICD-10-CM

## 2023-10-10 DIAGNOSIS — Z3480 Encounter for supervision of other normal pregnancy, unspecified trimester: Secondary | ICD-10-CM

## 2023-10-10 DIAGNOSIS — R55 Syncope and collapse: Secondary | ICD-10-CM

## 2023-10-10 NOTE — Progress Notes (Signed)
 PRENATAL VISIT NOTE  Subjective:  Miranda Curtis is a 23 y.o. [id number] at [redacted]w[redacted]d being seen today for ongoing prenatal care.  She is currently monitored for the following issues for this high-risk pregnancy and has History of gastric bypass; Supervision of other normal pregnancy, antepartum; Anxiety and depression; ADHD; Short interval between pregnancies affecting pregnancy, antepartum; Maternal varicella, non-immune; Obesity affecting pregnancy, antepartum; Iron  deficiency anemia; Recurrent syncope; Second trimester pregnancy; Vitamin B12 deficiency; and [redacted] weeks gestation of pregnancy on their problem list.  Patient recently hospitalized after having three syncopal episodes.  Is very grateful she was taken seriously.   Did start with ER and was admitted from 6/28 to 7/1 on hospitalist service with ob/gyn and cardiology consulting.  Head CT, EKG, blood work done.  She has a B12 deficiency, low iron  and low ferritin.  She received 1 IV iron  infusion.  She has been referred, already, to outpatient infusion center on [address] for 4 more infusions.  Order reviewed.  She is taking b12.  Contractions: Irritability. Vag. Bleeding: None.  Movement: Present. Denies leaking of fluid.   The following portions of the patient's history were reviewed and updated as appropriate: allergies, current medications, past family history, past medical history, past social history, past surgical history and problem list.   Objective:    Vitals:   10/10/23 1341  BP: 112/73  Pulse: (!) 106  Weight: 195 lb 9.6 oz (88.7 kg)    Fetal Status:  Fetal Heart Rate (bpm): 162   Movement: Present    General: Alert, oriented and cooperative. Patient is in no acute distress.  Skin: Skin is warm and dry. No rash noted.   Cardiovascular: Normal heart rate noted  Respiratory: Normal respiratory effort, no problems with respiration noted  Abdomen: Soft, gravid, appropriate for gestational age.  Pain/Pressure: Present      Pelvic: Cervical exam deferred        Extremities: Normal range of motion.  Edema: None  Mental Status: Normal mood and affect. Normal behavior. Normal judgment and thought content.   Assessment and Plan:  Pregnancy: [id number] at [redacted]w[redacted]d 1. [redacted] weeks gestation of pregnancy (Primary) - on PNV and baby ASA - Urine Culture repeated as was abnormal when hospitalized - Protein / creatinine ratio, urine (baseline) obtained today  2. Iron  deficiency anemia, unspecified iron  deficiency anemia type - orders for iron  infusions already written  3. History of gastric bypass - will continue to follow folate, b12, ferritin and Vit D levels  4. Vitamin B12 deficiency - on supplement  5. Short interval between pregnancies affecting pregnancy, antepartum  6. Recurrent syncope  7.  History of bariatric surgery - growth scans starting at 28 weeks and monthly after recommended.  She is already scheduled for this.  Preterm labor symptoms and general obstetric precautions including but not limited to vaginal bleeding, contractions, leaking of fluid and fetal movement were reviewed in detail with the patient. Please refer to After Visit Summary for other counseling recommendations.   Return in about 2 weeks (around 10/24/2023).  Future Appointments  Date Time Provider Department Center  10/23/2023  8:55 AM Cleotilde Ronal RAMAN, MD [facility]  11/11/2023  2:15 PM Cleotilde Ronal RAMAN, MD [facility]  11/28/2023  9:00 AM [facility] PROVIDER 1 [facility]  11/28/2023  9:30 AM [facility] US1 [facility]  12/11/2023  9:15 AM Lo, Arland POUR, CNM [facility]  12/13/2023  3:40 PM Tobb, Kardie, DO [facility] None  12/25/2023  9:15 AM Lo, Arland  K, CNM [facility]  01/08/2024  9:15 AM Cleotilde Ronal RAMAN, MD [facility]  01/22/2024  9:35 AM Tad, Arland POUR, CNM [facility]  01/29/2024  9:15 AM Cleotilde Ronal RAMAN, MD [facility]  02/05/2024  9:15 AM Tad, Arland POUR, CNM [facility]  02/12/2024  9:15 AM Cleotilde Ronal RAMAN, MD [facility]   02/19/2024  9:15 AM Lo, Arland POUR, CNM [facility]    Ronal RAMAN Cleotilde, MD

## 2023-10-12 ENCOUNTER — Ambulatory Visit (HOSPITAL_BASED_OUTPATIENT_CLINIC_OR_DEPARTMENT_OTHER): Payer: Self-pay | Admitting: Obstetrics & Gynecology

## 2023-10-12 LAB — PROTEIN / CREATININE RATIO, URINE
Creatinine, Urine: 370.9 mg/dL
Protein, Ur: 25.1 mg/dL
Protein/Creat Ratio: 68 mg/g{creat} (ref 0–200)

## 2023-10-12 LAB — URINE CULTURE

## 2023-10-13 DIAGNOSIS — Z9884 Bariatric surgery status: Secondary | ICD-10-CM | POA: Insufficient documentation

## 2023-10-14 ENCOUNTER — Other Ambulatory Visit: Payer: Self-pay | Admitting: Obstetrics and Gynecology

## 2023-10-16 ENCOUNTER — Encounter (HOSPITAL_BASED_OUTPATIENT_CLINIC_OR_DEPARTMENT_OTHER): Payer: Self-pay | Admitting: Obstetrics & Gynecology

## 2023-10-16 ENCOUNTER — Other Ambulatory Visit (HOSPITAL_BASED_OUTPATIENT_CLINIC_OR_DEPARTMENT_OTHER): Payer: Self-pay | Admitting: Obstetrics & Gynecology

## 2023-10-17 ENCOUNTER — Encounter (HOSPITAL_BASED_OUTPATIENT_CLINIC_OR_DEPARTMENT_OTHER): Payer: Self-pay | Admitting: Obstetrics & Gynecology

## 2023-10-21 ENCOUNTER — Ambulatory Visit: Payer: MEDICAID

## 2023-10-21 VITALS — BP 94/59 | HR 93 | Temp 98.3°F | Resp 18 | Ht 61.0 in | Wt 198.0 lb

## 2023-10-21 DIAGNOSIS — D509 Iron deficiency anemia, unspecified: Secondary | ICD-10-CM

## 2023-10-21 MED ORDER — IRON SUCROSE 20 MG/ML IV SOLN
200.0000 mg | Freq: Once | INTRAVENOUS | Status: AC
  Administered 2023-10-21: 200 mg via INTRAVENOUS
  Filled 2023-10-21: qty 10

## 2023-10-21 MED ORDER — SODIUM CHLORIDE 0.9 % IV BOLUS
250.0000 mL | Freq: Once | INTRAVENOUS | Status: AC
  Administered 2023-10-21: 250 mL via INTRAVENOUS
  Filled 2023-10-21: qty 250

## 2023-10-21 NOTE — Progress Notes (Signed)
 Diagnosis: Iron  Deficiency Anemia  Provider:  Mannam, Praveen MD  Procedure: IV Push  IV Type: Peripheral, IV Location: L Antecubital  Venofer  (Iron  Sucrose), Dose: 200 mg  Post Infusion IV Care: Observation period completed and Peripheral IV Discontinued  During IVP, patient c/o some arm pain after approximately 5 mL of iron  administered. IV flushed with 20 mL NS and heat pack applied to arm. Additional 3 mL of iron  administered, then patient c/o feeling dizzy and winded. Iron  stopped at 0855. VSS. Patient reclined and fan given. Patient stated she had not eaten breakfast or had much to drink this morning. Encouraged oral intake and set timer for observation. Within a few minutes, patient stated she was feeling better and within 10 minutes stated that symptoms had resolved. 30 minute observation completed and VSS throughout. Patient denied any symptoms. Remaining 2 mL iron  administered with NS running. Tolerated well.  Discharge: Condition: Good, Destination: Home . AVS Declined  Performed by:  Rocky FORBES Sar, RN

## 2023-10-23 ENCOUNTER — Ambulatory Visit (HOSPITAL_BASED_OUTPATIENT_CLINIC_OR_DEPARTMENT_OTHER): Payer: MEDICAID | Admitting: Obstetrics & Gynecology

## 2023-10-23 VITALS — BP 92/61 | HR 101 | Wt 199.0 lb

## 2023-10-23 DIAGNOSIS — O09899 Supervision of other high risk pregnancies, unspecified trimester: Secondary | ICD-10-CM

## 2023-10-23 DIAGNOSIS — R55 Syncope and collapse: Secondary | ICD-10-CM

## 2023-10-23 DIAGNOSIS — O09892 Supervision of other high risk pregnancies, second trimester: Secondary | ICD-10-CM

## 2023-10-23 DIAGNOSIS — Z3A23 23 weeks gestation of pregnancy: Secondary | ICD-10-CM | POA: Diagnosis not present

## 2023-10-23 DIAGNOSIS — E538 Deficiency of other specified B group vitamins: Secondary | ICD-10-CM | POA: Diagnosis not present

## 2023-10-23 DIAGNOSIS — D508 Other iron deficiency anemias: Secondary | ICD-10-CM

## 2023-10-23 DIAGNOSIS — Z9884 Bariatric surgery status: Secondary | ICD-10-CM | POA: Diagnosis not present

## 2023-10-23 DIAGNOSIS — O0992 Supervision of high risk pregnancy, unspecified, second trimester: Secondary | ICD-10-CM

## 2023-10-23 DIAGNOSIS — Z3480 Encounter for supervision of other normal pregnancy, unspecified trimester: Secondary | ICD-10-CM

## 2023-10-23 DIAGNOSIS — Z3482 Encounter for supervision of other normal pregnancy, second trimester: Secondary | ICD-10-CM

## 2023-10-24 ENCOUNTER — Ambulatory Visit: Payer: MEDICAID

## 2023-10-24 VITALS — BP 98/62 | HR 93 | Temp 99.0°F | Resp 18 | Ht 61.0 in | Wt 204.2 lb

## 2023-10-24 DIAGNOSIS — D509 Iron deficiency anemia, unspecified: Secondary | ICD-10-CM | POA: Diagnosis not present

## 2023-10-24 MED ORDER — SODIUM CHLORIDE 0.9 % IV BOLUS
250.0000 mL | Freq: Once | INTRAVENOUS | Status: AC
  Administered 2023-10-24: 250 mL via INTRAVENOUS
  Filled 2023-10-24: qty 250

## 2023-10-24 MED ORDER — IRON SUCROSE 20 MG/ML IV SOLN
200.0000 mg | Freq: Once | INTRAVENOUS | Status: AC
  Administered 2023-10-24: 200 mg via INTRAVENOUS
  Filled 2023-10-24: qty 10

## 2023-10-24 NOTE — Progress Notes (Signed)
 Diagnosis: Iron Deficiency Anemia  Provider:  Chilton Greathouse MD  Procedure: IV Push  IV Type: Peripheral, IV Location: R Antecubital  Venofer (Iron Sucrose), Dose: 200 mg  Post Infusion IV Care: Observation period completed and Peripheral IV Discontinued  Discharge: Condition: Good, Destination: Home . AVS Declined  Performed by:  Loney Hering, LPN

## 2023-10-26 NOTE — Progress Notes (Signed)
msm

## 2023-10-26 NOTE — Progress Notes (Addendum)
 PRENATAL VISIT NOTE  Subjective:  Miranda Curtis is a 23 y.o. [phone] at [redacted]w[redacted]d being seen today for ongoing prenatal care.  She is currently monitored for the following issues for this low-risk pregnancy and has History of gastric bypass; Supervision of normal pregnancy; Anxiety and depression; ADHD; Short interval between pregnancies affecting pregnancy, antepartum; Maternal varicella, non-immune; Obesity affecting pregnancy, antepartum; Iron  deficiency anemia; Recurrent syncope; B12 deficiency; and History of bariatric surgery on their problem list.  Patient reports she's had another episode when she almost passed out.  Having to be careful with what she does and when she starts to feel weak.  We discussed adding salt/electrolytes to food/fluid and staying hydrated.  Contractions: Regular. Vag. Bleeding: None.  Movement: Present. Denies leaking of fluid.   The following portions of the patient's history were reviewed and updated as appropriate: allergies, current medications, past family history, past medical history, past social history, past surgical history and problem list.   Objective:   Vitals:   10/23/23 0908  BP: 92/61  Pulse: (!) 101  Weight: 199 lb (90.3 kg)    Fetal Status: Fetal Heart Rate (bpm): 152 Fundal Height: 25 cm  Movement: Present    General:  Alert, oriented and cooperative. Patient is in no acute distress.  Skin: Skin is warm and dry. No rash noted.   Cardiovascular: Normal heart rate noted  Respiratory: Normal respiratory effort, no problems with respiration noted  Abdomen: Soft, gravid, appropriate for gestational age.  Pain/Pressure: Present     Pelvic: Cervical exam deferred        Extremities: Normal range of motion.  Edema: None  Mental Status: Normal mood and affect. Normal behavior. Normal judgment and thought content.   Assessment and Plan:  Pregnancy: [id number] at [redacted]w[redacted]d 1. Supervision of high risk pregnancy in second trimester (Primary) - on PNV and  baby ASA  2. [redacted] weeks gestation of pregnancy - recheck 4 weeks  3. B12 deficiency - on oral B12  4. History of bariatric surgery - growth scans every 4- 6 weeks recommended by MFM.  Next one scheduled around 28 weeks.  5. Other iron  deficiency anemia - having IV iron  infusions  6. Short interval between pregnancies affecting pregnancy, antepartum  7. Recurrent syncope   Preterm labor symptoms and general obstetric precautions including but not limited to vaginal bleeding, contractions, leaking of fluid and fetal movement were reviewed in detail with the patient. Please refer to After Visit Summary for other counseling recommendations.   Return in about 4 weeks (around 11/20/2023) for 3rd trimester labs (RPR, HIV, CBC), 2 hr GTT, Tdap.  Future Appointments  Date Time Provider Department Center  10/28/2023 11:00 AM [facility] 8 [facility] None  10/30/2023  9:00 AM [facility] 6 [facility] None  11/11/2023  8:15 AM [facility] OBGYN LAB [facility]  11/11/2023 10:55 AM Lo, Arland POUR, CNM [facility]  11/28/2023  9:00 AM [facility] PROVIDER 1 [facility]  11/28/2023  9:30 AM [facility] US1 [facility]  12/11/2023  9:15 AM Lo, Arland POUR, CNM [facility]  12/13/2023  3:40 PM Tobb, Dub, DO [facility] None  12/25/2023  9:15 AM Lo, Arland POUR, CNM [facility]  01/08/2024  9:15 AM Cleotilde Ronal RAMAN, MD [facility]  01/22/2024  9:35 AM Lo, Arland POUR, CNM [facility]  01/29/2024  9:15 AM Cleotilde Ronal RAMAN, MD [facility]  01/29/2024 10:45 AM Gregg Lek, MD [facility] None  02/05/2024  9:15 AM Tad, Arland POUR, CNM [facility]  02/12/2024  9:15 AM Cleotilde Ronal RAMAN, MD [facility]  02/19/2024  9:15 AM Lo, Arland POUR, CNM [facility]    Ronal RAMAN Cleotilde, MD

## 2023-10-28 ENCOUNTER — Encounter (HOSPITAL_BASED_OUTPATIENT_CLINIC_OR_DEPARTMENT_OTHER): Payer: Self-pay | Admitting: Obstetrics & Gynecology

## 2023-10-28 ENCOUNTER — Inpatient Hospital Stay (HOSPITAL_COMMUNITY)
Admission: AD | Admit: 2023-10-28 | Discharge: 2023-10-29 | Disposition: A | Discharge status: Home / Self Care | Payer: MEDICAID | Attending: Obstetrics and Gynecology | Admitting: Obstetrics and Gynecology

## 2023-10-28 ENCOUNTER — Ambulatory Visit: Payer: MEDICAID

## 2023-10-28 DIAGNOSIS — R55 Syncope and collapse: Secondary | ICD-10-CM | POA: Diagnosis present

## 2023-10-28 DIAGNOSIS — U071 COVID-19: Secondary | ICD-10-CM | POA: Insufficient documentation

## 2023-10-28 DIAGNOSIS — O368120 Decreased fetal movements, second trimester, not applicable or unspecified: Secondary | ICD-10-CM | POA: Diagnosis present

## 2023-10-28 DIAGNOSIS — Z3A23 23 weeks gestation of pregnancy: Secondary | ICD-10-CM | POA: Diagnosis not present

## 2023-10-28 DIAGNOSIS — E876 Hypokalemia: Secondary | ICD-10-CM | POA: Diagnosis not present

## 2023-10-28 DIAGNOSIS — O99282 Endocrine, nutritional and metabolic diseases complicating pregnancy, second trimester: Secondary | ICD-10-CM | POA: Diagnosis not present

## 2023-10-28 DIAGNOSIS — R0602 Shortness of breath: Secondary | ICD-10-CM | POA: Diagnosis present

## 2023-10-28 DIAGNOSIS — O98512 Other viral diseases complicating pregnancy, second trimester: Secondary | ICD-10-CM | POA: Diagnosis not present

## 2023-10-28 LAB — URINALYSIS, ROUTINE W REFLEX MICROSCOPIC
Bilirubin Urine: NEGATIVE
Glucose, UA: NEGATIVE mg/dL
Hgb urine dipstick: NEGATIVE
Ketones, ur: 20 mg/dL — AB
Nitrite: NEGATIVE
Protein, ur: NEGATIVE mg/dL
Specific Gravity, Urine: 1.02 (ref 1.005–1.030)
pH: 7 (ref 5.0–8.0)

## 2023-10-28 LAB — CBC WITH DIFFERENTIAL/PLATELET
Abs Immature Granulocytes: 0.09 K/uL — ABNORMAL HIGH (ref 0.00–0.07)
Basophils Absolute: 0 K/uL (ref 0.0–0.1)
Basophils Relative: 1 %
Eosinophils Absolute: 0.1 K/uL (ref 0.0–0.5)
Eosinophils Relative: 2 %
HCT: 26.6 % — ABNORMAL LOW (ref 36.0–46.0)
Hemoglobin: 8.3 g/dL — ABNORMAL LOW (ref 12.0–15.0)
Immature Granulocytes: 2 %
Lymphocytes Relative: 6 %
Lymphs Abs: 0.3 K/uL — ABNORMAL LOW (ref 0.7–4.0)
MCH: 21.1 pg — ABNORMAL LOW (ref 26.0–34.0)
MCHC: 31.2 g/dL (ref 30.0–36.0)
MCV: 67.7 fL — ABNORMAL LOW (ref 80.0–100.0)
Monocytes Absolute: 0.4 K/uL (ref 0.1–1.0)
Monocytes Relative: 7 %
Neutro Abs: 4.5 K/uL (ref 1.7–7.7)
Neutrophils Relative %: 82 %
Platelets: 227 K/uL (ref 150–400)
RBC: 3.93 MIL/uL (ref 3.87–5.11)
RDW: 26 % — ABNORMAL HIGH (ref 11.5–15.5)
Smear Review: NORMAL
WBC: 5.4 K/uL (ref 4.0–10.5)
nRBC: 0 % (ref 0.0–0.2)

## 2023-10-28 LAB — BASIC METABOLIC PANEL WITH GFR
Anion gap: 8 (ref 5–15)
BUN: <5 mg/dL — ABNORMAL LOW (ref 6–20)
CO2: 19 mmol/L — ABNORMAL LOW (ref 22–32)
Calcium: 8.7 mg/dL — ABNORMAL LOW (ref 8.9–10.3)
Chloride: 107 mmol/L (ref 98–111)
Creatinine, Ser: 0.63 mg/dL (ref 0.44–1.00)
GFR, Estimated: >60 mL/min (ref >60)
Glucose, Bld: 97 mg/dL (ref 70–99)
Potassium: 3.2 mmol/L — ABNORMAL LOW (ref 3.5–5.1)
Sodium: 134 mmol/L — ABNORMAL LOW (ref 135–145)

## 2023-10-28 MED ORDER — ONDANSETRON HCL 4 MG/2ML IJ SOLN
4.0000 mg | Freq: Once | INTRAMUSCULAR | Status: AC
  Administered 2023-10-28: 4 mg via INTRAVENOUS
  Filled 2023-10-28: qty 2

## 2023-10-28 MED ORDER — SODIUM CHLORIDE 0.9 % IV BOLUS
250.0000 mL | Freq: Once | INTRAVENOUS | Status: DC
  Filled 2023-10-28: qty 250

## 2023-10-28 MED ORDER — ACETAMINOPHEN-CAFFEINE 500-65 MG PO TABS
2.0000 | ORAL_TABLET | Freq: Once | ORAL | Status: AC
  Administered 2023-10-28: 2 via ORAL
  Filled 2023-10-28: qty 2

## 2023-10-28 MED ORDER — IRON SUCROSE 20 MG/ML IV SOLN: 200.0000 mg | Freq: Once | INTRAVENOUS | Status: DC

## 2023-10-28 MED ORDER — LACTATED RINGERS IV BOLUS
1000.0000 mL | Freq: Once | INTRAVENOUS | Status: AC
  Administered 2023-10-28: 1000 mL via INTRAVENOUS

## 2023-10-28 MED ORDER — CYCLOBENZAPRINE HCL 5 MG PO TABS
10.0000 mg | ORAL_TABLET | Freq: Once | ORAL | Status: AC
  Administered 2023-10-28: 10 mg via ORAL
  Filled 2023-10-28: qty 2

## 2023-10-28 NOTE — MAU Note (Signed)
 Miranda Curtis is a 23 y.o. at [redacted]w[redacted]d here in MAU reporting: been sick all day - body aches, HA, back pain, dizzy which is normal, but too dizzy, and SOB. Reports passing out yesterday morning and was by herself, so unsure how long she was out. I remember going into the bathroom and picking myself up - unsure how she fell or if she hit her abdomen or not. States after waking up she got something to drink and went to bed. Has not felt FM all day. Denies VB or LOF. Took tylenol  at around 1500 without relief.   LMP: NA Onset of complaint: all day  Pain score: 10 - body; 10 - HA; 10 - back  Vitals:   10/28/23 2102  BP: (!) 109/57  Pulse: (!) 138  Resp: 20  Temp: 99.7 F (37.6 C)  SpO2: 100%     FHT: 166  Lab orders placed from triage: UA

## 2023-10-28 NOTE — MAU Provider Note (Signed)
 History     CSN: [id number]  Arrival date and time: 10/28/23 2049   Event Date/Time   First Provider Initiated Contact with Patient 10/28/23 2114      Chief Complaint  Patient presents with   Back Pain   Headache   Dizziness   Decreased Fetal Movement   Generalized Body Aches   Miranda Curtis is a 23 y.o. [id number] at [redacted]w[redacted]d who receives care at [facility].  She presents today for flu-like symptoms.  She states she woke up around 0900 and was experiencing headache and excruciating back pain. She states she took tylenol  around 2 or 3pm with no relief.  She describes HA as throbbing and is located in middle of head.  She reports feeling cold all day. She describes lower back pain as sharp constant pain that has no relieving or worsening symptoms.  She also reports DFM and notes last movement was yesterday afternoon. She also reports syncope episode yesterday around 0500. She denies being around sick individuals and does not work outside the home. 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-has resolved following surgery   Eczema    History of depression 11/18/2017   History of diabetes mellitus 04/11/2022   Maternal varicella, non-immune 05/09/2017   Missed abortion 04/07/2019   Vitamin B 12 deficiency 10/26/2017   05/09/22: b12 injection     Vitamin D  deficiency 05/09/2022    Past Surgical History:  Procedure Laterality Date   CHOLECYSTECTOMY N/A 03/13/2023   Procedure: LAPAROSCOPIC CHOLECYSTECTOMY WITH ICG DYE;  Surgeon: [name], MD;  Location: [facility];  Service: General;  Laterality: N/A;    DILATION AND EVACUATION N/A 02/24/2023   Procedure: DILATATION AND EVACUATION;  Surgeon: [name], MD;  Location: [facility] OR;  Service: Gynecology;  Laterality: N/A;   SLEEVE GASTROPLASTY  10/12/2021   UPPER GASTROINTESTINAL ENDOSCOPY N/A     Family History  Problem Relation Age of Onset   Diabetes Mother    Healthy Mother    Sickle cell trait Mother    Sickle cell trait Father    Rashes / Skin problems Father        eczema   Asthma Sister    Rashes / Skin problems Sister        eczema   Rashes / Skin problems Sister        eczema   Rashes / Skin problems Sister        eczema   Sickle cell anemia Sister    Rashes / Skin problems Sister        eczema   Autism Brother    Healthy Brother    Autism Daughter    Diabetes Maternal Grandmother    Hypertension Maternal Grandmother    Cancer Maternal Grandfather        prostate   Cancer Paternal Grandmother 33  lung   Healthy Paternal Grandfather    Ovarian cancer Neg Hx    Colon cancer Neg Hx    Breast cancer Neg Hx     Social History   Tobacco Use   Smoking status: Never   Smokeless tobacco: Never  Vaping Use   Vaping status: Never Used  Substance Use Topics   Alcohol use: No   Drug use: No    Allergies:  Allergies  Allergen Reactions   Reglan  [Metoclopramide ] Anxiety and Other (See Comments)    Severe panic attack    Medications Prior to Admission  Medication Sig Dispense Refill Last Dose/Taking   aspirin  EC 81 MG tablet Take 1 tablet (81 mg total) by mouth daily. Swallow whole. 100 tablet 12    cyclobenzaprine  (FLEXERIL ) 10 MG tablet Take 1 tablet (10 mg total) by mouth 2 (two) times daily as needed. 20 tablet 0    Ferric Maltol  (ACCRUFER ) 30 MG CAPS Take 1 capsule (30 mg total) by mouth 2 (two) times daily at 8 am and 10 pm. 60 capsule 5    ondansetron  (ZOFRAN -ODT) 4 MG disintegrating tablet Take 1 tablet (4 mg total) by mouth every 8 (eight) hours as needed. 60 tablet 0    prenatal vitamin w/FE, FA  (NATACHEW) 29-1 MG CHEW chewable tablet Chew 1 tablet by mouth daily at 12 noon. 30 tablet 2    vitamin B-12 (VITAMIN B12) 500 MCG tablet Take 1 tablet (500 mcg total) by mouth daily. 30 tablet 3     Review of Systems  Constitutional:  Positive for chills. Negative for fever.  Gastrointestinal:  Positive for nausea. Negative for abdominal pain, constipation, diarrhea and vomiting.  Genitourinary:  Positive for pelvic pain (Pressure). Negative for difficulty urinating, dysuria, vaginal bleeding (Reports some spotting last night) and vaginal discharge.  Musculoskeletal:  Positive for back pain.  Neurological:  Positive for dizziness and headaches. Negative for light-headedness.   Physical Exam   Blood pressure (!) 109/57, pulse (!) 138, temperature 99.7 F (37.6 C), temperature source Oral, resp. rate 20, height 5' 1 (1.549 m), weight 91.9 kg, SpO2 100%, not currently breastfeeding.  Physical Exam Vitals and nursing note reviewed.  Constitutional:      Appearance: She is well-developed. She is ill-appearing.  HENT:     Head: Normocephalic and atraumatic.  Eyes:     Conjunctiva/sclera: Conjunctivae normal.  Cardiovascular:     Rate and Rhythm: Tachycardia present.     Heart sounds: Normal heart sounds.  Pulmonary:     Effort: Pulmonary effort is normal. No respiratory distress.     Breath sounds: Normal breath sounds.  Abdominal:     Palpations: Abdomen is soft.     Tenderness: There is no abdominal tenderness.  Musculoskeletal:        General: Normal range of motion.     Cervical back: Normal range of motion.  Skin:    General: Skin is warm and dry.  Neurological:     Mental Status: She is alert and oriented to person, place, and time.  Psychiatric:        Mood and Affect: Mood normal.        Behavior: Behavior normal.     Fetal Assessment 160 bpm, Mod Var, -Decels, +Accels Toco: No ctx graphed  MAU Course   Results for orders placed or performed during the hospital  encounter of 10/28/23 (from the past 24 hours)  Urinalysis, Routine w reflex microscopic -Urine, Clean Catch     Status:  Abnormal   Collection Time: 10/28/23  9:16 PM  Result Value Ref Range   Color, Urine YELLOW YELLOW   APPearance HAZY (A) CLEAR   Specific Gravity, Urine 1.020 1.005 - 1.030   pH 7.0 5.0 - 8.0   Glucose, UA NEGATIVE NEGATIVE mg/dL   Hgb urine dipstick NEGATIVE NEGATIVE   Bilirubin Urine NEGATIVE NEGATIVE   Ketones, ur 20 (A) NEGATIVE mg/dL   Protein, ur NEGATIVE NEGATIVE mg/dL   Nitrite NEGATIVE NEGATIVE   Leukocytes,Ua LARGE (A) NEGATIVE   RBC / HPF 6-10 0 - 5 RBC/hpf   WBC, UA 21-50 0 - 5 WBC/hpf   Bacteria, UA FEW (A) NONE SEEN   Squamous Epithelial / HPF 11-20 0 - 5 /HPF   Mucus PRESENT    Budding Yeast PRESENT   CBC with Differential/Platelet     Status: Abnormal   Collection Time: 10/28/23  9:54 PM  Result Value Ref Range   WBC 5.4 4.0 - 10.5 K/uL   RBC 3.93 3.87 - 5.11 MIL/uL   Hemoglobin 8.3 (L) 12.0 - 15.0 g/dL   HCT 73.3 (L) 63.9 - 53.9 %   MCV 67.7 (L) 80.0 - 100.0 fL   MCH 21.1 (L) 26.0 - 34.0 pg   MCHC 31.2 30.0 - 36.0 g/dL   RDW 73.9 (H) 88.4 - 84.4 %   Platelets 227 150 - 400 K/uL   nRBC 0.0 0.0 - 0.2 %   Neutrophils Relative % 82 %   Neutro Abs 4.5 1.7 - 7.7 K/uL   Lymphocytes Relative 6 %   Lymphs Abs 0.3 (L) 0.7 - 4.0 K/uL   Monocytes Relative 7 %   Monocytes Absolute 0.4 0.1 - 1.0 K/uL   Eosinophils Relative 2 %   Eosinophils Absolute 0.1 0.0 - 0.5 K/uL   Basophils Relative 1 %   Basophils Absolute 0.0 0.0 - 0.1 K/uL   WBC Morphology MORPHOLOGY UNREMARKABLE    RBC Morphology MORPHOLOGY UNREMARKABLE    Smear Review Normal platelet morphology    Immature Granulocytes 2 %   Abs Immature Granulocytes 0.09 (H) 0.00 - 0.07 K/uL  Basic metabolic panel     Status: Abnormal   Collection Time: 10/28/23  9:54 PM  Result Value Ref Range   Sodium 134 (L) 135 - 145 mmol/L   Potassium 3.2 (L) 3.5 - 5.1 mmol/L   Chloride 107 98 - 111 mmol/L    CO2 19 (L) 22 - 32 mmol/L   Glucose, Bld 97 70 - 99 mg/dL   BUN <5 (L) 6 - 20 mg/dL   Creatinine, Ser 9.36 0.44 - 1.00 mg/dL   Calcium 8.7 (L) 8.9 - 10.3 mg/dL   GFR, Estimated >39 >39 mL/min   Anion gap 8 5 - 15   No results found.   MDM PE Labs:CBC/D, BMP with GFR, Covid/Flu EFM Start IV LR Bolus x 2 Antiemetic Prescriptions Assessment and Plan   23 year old [id number]  SIUP at 23.5 weeks Cat I FT Flu-Like Symptoms   -Exam performed. -Start IV and give fluids. -Labs ordered. -Patient offered and agreeable to Covid/Flu testing. -Will also give zofran  for c/o nausea. -Monitor and reassess.    Harlene LITTIE Duncans MSN, CNM 10/28/2023, 9:14 PM   Reassessment (10:40 PM) -Results reviewed. Covid/Flu pending. -No significant differences from previous findings with exception of hypokalemia. Will treat orally.  -Give additional fluids. -Provider to bedside to reassess and patient reports back pain and no improvement in overall feelings of malaise.  Endorses fetal movement.  Reassessment (12:17 AM) -Covid returns positive. -Patient informed of results. Reviewed quarantine.  -Patient family member questions addressed. Encouraged to wear mask.  -Discussed Paxlovid  and reviewed benefits and associated side effects. -Patient agreeable and script to be sent to pharmacy.  -Script for Kdur also sent.  -Will give remainder of fluid and then discharge home.   Harlene LITTIE Duncans MSN, CNM Advanced Practice Provider, [facility] [employer]

## 2023-10-29 ENCOUNTER — Encounter (HOSPITAL_BASED_OUTPATIENT_CLINIC_OR_DEPARTMENT_OTHER): Payer: MEDICAID | Admitting: Obstetrics and Gynecology

## 2023-10-29 DIAGNOSIS — U071 COVID-19: Secondary | ICD-10-CM

## 2023-10-29 DIAGNOSIS — Z3A23 23 weeks gestation of pregnancy: Secondary | ICD-10-CM

## 2023-10-29 DIAGNOSIS — O98512 Other viral diseases complicating pregnancy, second trimester: Secondary | ICD-10-CM

## 2023-10-29 LAB — RESP PANEL BY RT-PCR (RSV, FLU A&B, COVID)  RVPGX2
Influenza A by PCR: NEGATIVE
Influenza B by PCR: NEGATIVE
Resp Syncytial Virus by PCR: NEGATIVE
SARS Coronavirus 2 by RT PCR: POSITIVE — AB

## 2023-10-29 MED ORDER — POTASSIUM CHLORIDE ER 10 MEQ PO TBCR
10.0000 meq | EXTENDED_RELEASE_TABLET | Freq: Every day | ORAL | 0 refills | Status: DC
Start: 2023-10-29 — End: 2023-11-18

## 2023-10-29 MED ORDER — NIRMATRELVIR/RITONAVIR (PAXLOVID)TABLET: 3.0000 | ORAL_TABLET | Freq: Two times a day (BID) | ORAL | 0 refills | Status: AC

## 2023-10-30 ENCOUNTER — Ambulatory Visit: Payer: MEDICAID

## 2023-10-30 ENCOUNTER — Encounter (HOSPITAL_BASED_OUTPATIENT_CLINIC_OR_DEPARTMENT_OTHER): Payer: Self-pay | Admitting: Obstetrics & Gynecology

## 2023-10-30 LAB — CULTURE, OB URINE: Culture: 50000 — AB

## 2023-10-30 MED ORDER — IRON SUCROSE 20 MG/ML IV SOLN: 200.0000 mg | Freq: Once | INTRAVENOUS | Status: DC

## 2023-10-30 MED ORDER — SODIUM CHLORIDE 0.9 % IV BOLUS
250.0000 mL | Freq: Once | INTRAVENOUS | Status: DC
  Filled 2023-10-30: qty 250

## 2023-10-31 ENCOUNTER — Encounter: Payer: Self-pay | Admitting: Obstetrics and Gynecology

## 2023-11-05 ENCOUNTER — Encounter (HOSPITAL_BASED_OUTPATIENT_CLINIC_OR_DEPARTMENT_OTHER): Payer: Self-pay | Admitting: Obstetrics & Gynecology

## 2023-11-11 ENCOUNTER — Ambulatory Visit (HOSPITAL_BASED_OUTPATIENT_CLINIC_OR_DEPARTMENT_OTHER): Payer: MEDICAID | Admitting: Certified Nurse Midwife

## 2023-11-11 ENCOUNTER — Other Ambulatory Visit (HOSPITAL_BASED_OUTPATIENT_CLINIC_OR_DEPARTMENT_OTHER): Payer: MEDICAID

## 2023-11-11 ENCOUNTER — Encounter (HOSPITAL_BASED_OUTPATIENT_CLINIC_OR_DEPARTMENT_OTHER): Payer: Self-pay | Admitting: Obstetrics & Gynecology

## 2023-11-11 VITALS — BP 102/67 | HR 88 | Wt 199.2 lb

## 2023-11-11 DIAGNOSIS — Z3482 Encounter for supervision of other normal pregnancy, second trimester: Secondary | ICD-10-CM

## 2023-11-11 DIAGNOSIS — D519 Vitamin B12 deficiency anemia, unspecified: Secondary | ICD-10-CM

## 2023-11-11 DIAGNOSIS — O99842 Bariatric surgery status complicating pregnancy, second trimester: Secondary | ICD-10-CM | POA: Diagnosis not present

## 2023-11-11 DIAGNOSIS — D509 Iron deficiency anemia, unspecified: Secondary | ICD-10-CM

## 2023-11-11 DIAGNOSIS — Z8616 Personal history of COVID-19: Secondary | ICD-10-CM | POA: Diagnosis not present

## 2023-11-11 DIAGNOSIS — Z3A25 25 weeks gestation of pregnancy: Secondary | ICD-10-CM

## 2023-11-11 DIAGNOSIS — Z1332 Encounter for screening for maternal depression: Secondary | ICD-10-CM

## 2023-11-11 DIAGNOSIS — O98512 Other viral diseases complicating pregnancy, second trimester: Secondary | ICD-10-CM | POA: Insufficient documentation

## 2023-11-11 DIAGNOSIS — O99210 Obesity complicating pregnancy, unspecified trimester: Secondary | ICD-10-CM

## 2023-11-11 DIAGNOSIS — O99012 Anemia complicating pregnancy, second trimester: Secondary | ICD-10-CM | POA: Diagnosis not present

## 2023-11-11 DIAGNOSIS — Z9884 Bariatric surgery status: Secondary | ICD-10-CM

## 2023-11-11 NOTE — Progress Notes (Signed)
 PRENATAL VISIT NOTE  Subjective:  Miranda Curtis is a 23 y.o. [phone] at [redacted]w[redacted]d being seen today for ongoing prenatal care.  She is currently monitored for the following issues for this  pregnancy and has History of gastric bypass; Supervision of normal pregnancy; Anxiety and depression; ADHD; Short interval between pregnancies affecting pregnancy, antepartum; Maternal varicella, non-immune; Obesity affecting pregnancy, antepartum; Iron  deficiency anemia; Recurrent syncope; B12 deficiency; and History of bariatric surgery on their problem list.  Patient reports no bleeding, no contractions, no cramping, and no leaking.  Contractions: Irregular. Vag. Bleeding: None.  Movement: Present. Denies leaking of fluid.   The following portions of the patient's history were reviewed and updated as appropriate: allergies, current medications, past family history, past medical history, past social history, past surgical history and problem list.   Objective:    Vitals:   11/11/23 1053  BP: 102/67  Pulse: 88  Weight: 199 lb 3.2 oz (90.4 kg)    Fetal Status:      Movement: Present    General: Alert, oriented and cooperative. Patient is in no acute distress.  Skin: Skin is warm and dry. No rash noted.   Cardiovascular: Normal heart rate noted  Respiratory: Normal respiratory effort, no problems with respiration noted  Abdomen: Soft, gravid, appropriate for gestational age.  Pain/Pressure: Present (Pressure)     Pelvic: Cervical exam deferred        Extremities: Normal range of motion.  Edema: None  Mental Status: Normal mood and affect. Normal behavior. Normal judgment and thought content.   Assessment and Plan:  Pregnancy: [id number] at [redacted]w[redacted]d  1. Supervision of high risk pregnancy in second trimester (Primary) - Panorama Low-Risk, female. AFP Normal. - Prior SVD x 2; Pelvis proven to 6lb 11oz. - on PNV and baby ASA - US  (09/26/23): Vtx, fundal placenta, AFV wnl, EFW 11oz (72%), HC 67%, AC 69%,  CL 4.5cm, anatomic scan complete.Serial growth US  q4-6 weeks. Delivery around 39 weeks or sooner if indicated. Next US  for Growth 11/28/23. - US  (10/08/23): Breech, anterior fundal placenta, AFV wnl, CL 5.7cm.    2. [redacted] weeks gestation of pregnancy - recheck 4 weeks   3. B12 deficiency - on oral B12   4. 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  Next one scheduled around 28 weeks. - Plan labs at next return prenatal visit (3rd Trimester)-folate/Vitamin b12/Ferritin/Vitamin D  - Pt has only gained 2lb total in pregnancy at 25w5   5. Other iron  deficiency anemia - having IV iron  infusions (10/24/23, - Most recent Hg (10/28/23): 8.3 - CBC pending today   6. Short interval between pregnancies affecting pregnancy, antepartum   7. Recurrent syncope  8. Hx Covid in Pregnancy (10/21/23, 10/28/23)   Preterm labor symptoms and general obstetric precautions including but not limited to vaginal bleeding, contractions, leaking of fluid and fetal movement were reviewed in detail with the patient. Please refer to After Visit Summary for other counseling recommendations.   No follow-ups on file.  Future Appointments  Date Time Provider Department Center  11/28/2023  9:00 AM [facility] PROVIDER 1 [facility]  11/28/2023  9:30 AM [facility] US1 [facility]  12/11/2023  9:15 AM Jax Kentner, Arland POUR, CNM [facility]  12/13/2023  3:40 PM Tobb, Dub, DO [facility] None  12/25/2023  8:55 AM Ambria Mayfield, Arland POUR, CNM [facility]  01/08/2024  9:15 AM Cleotilde Ronal RAMAN, MD [facility]  01/22/2024  9:35 AM Kwaku Mostafa,  Arland POUR, CNM [facility]  01/29/2024  9:15 AM Cleotilde Ronal RAMAN, MD [facility]  01/29/2024 10:45 AM Gregg Lek, MD [facility] None  02/05/2024  9:15 AM Tad, Arland POUR, CNM [facility]  02/12/2024  9:15 AM Cleotilde Ronal RAMAN, MD [facility]  02/19/2024  9:15 AM Masiya Claassen, Arland POUR, CNM [facility]    Arland POUR Tad, CNM

## 2023-11-12 ENCOUNTER — Ambulatory Visit (HOSPITAL_BASED_OUTPATIENT_CLINIC_OR_DEPARTMENT_OTHER): Payer: Self-pay | Admitting: Certified Nurse Midwife

## 2023-11-12 LAB — RPR: RPR Ser Ql: NONREACTIVE

## 2023-11-12 LAB — GLUCOSE TOLERANCE, 2 HOURS W/ 1HR
Glucose, 1 hour: 74 mg/dL (ref 70–179)
Glucose, 2 hour: 66 mg/dL — ABNORMAL LOW (ref 70–152)
Glucose, Fasting: 71 mg/dL (ref 70–91)

## 2023-11-12 LAB — CBC
Hematocrit: 32.2 % — ABNORMAL LOW (ref 34.0–46.6)
Hemoglobin: 9.5 g/dL — ABNORMAL LOW (ref 11.1–15.9)
MCH: 21.1 pg — ABNORMAL LOW (ref 26.6–33.0)
MCHC: 29.5 g/dL — ABNORMAL LOW (ref 31.5–35.7)
MCV: 71 fL — ABNORMAL LOW (ref 79–97)
Platelets: 339 x10E3/uL (ref 150–450)
RBC: 4.51 x10E6/uL (ref 3.77–5.28)
RDW: 26 % — ABNORMAL HIGH (ref 11.7–15.4)
WBC: 6.8 x10E3/uL (ref 3.4–10.8)

## 2023-11-12 LAB — HIV ANTIBODY (ROUTINE TESTING W REFLEX): HIV Screen 4th Generation wRfx: NONREACTIVE

## 2023-11-18 ENCOUNTER — Telehealth (HOSPITAL_BASED_OUTPATIENT_CLINIC_OR_DEPARTMENT_OTHER): Payer: Self-pay

## 2023-11-18 ENCOUNTER — Other Ambulatory Visit: Payer: Self-pay

## 2023-11-18 ENCOUNTER — Encounter (HOSPITAL_COMMUNITY): Payer: Self-pay | Admitting: Obstetrics and Gynecology

## 2023-11-18 ENCOUNTER — Inpatient Hospital Stay (HOSPITAL_COMMUNITY)
Admission: AD | Admit: 2023-11-18 | Discharge: 2023-11-18 | Disposition: A | Discharge status: Home / Self Care | Payer: MEDICAID | Attending: Obstetrics & Gynecology | Admitting: Obstetrics & Gynecology

## 2023-11-18 DIAGNOSIS — R102 Pelvic and perineal pain: Secondary | ICD-10-CM | POA: Diagnosis present

## 2023-11-18 DIAGNOSIS — Z3A26 26 weeks gestation of pregnancy: Secondary | ICD-10-CM

## 2023-11-18 DIAGNOSIS — O98812 Other maternal infectious and parasitic diseases complicating pregnancy, second trimester: Secondary | ICD-10-CM | POA: Insufficient documentation

## 2023-11-18 DIAGNOSIS — O99012 Anemia complicating pregnancy, second trimester: Secondary | ICD-10-CM | POA: Diagnosis not present

## 2023-11-18 DIAGNOSIS — B3731 Acute candidiasis of vulva and vagina: Secondary | ICD-10-CM | POA: Diagnosis not present

## 2023-11-18 DIAGNOSIS — R262 Difficulty in walking, not elsewhere classified: Secondary | ICD-10-CM | POA: Diagnosis present

## 2023-11-18 DIAGNOSIS — O4692 Antepartum hemorrhage, unspecified, second trimester: Secondary | ICD-10-CM | POA: Diagnosis not present

## 2023-11-18 DIAGNOSIS — O26852 Spotting complicating pregnancy, second trimester: Secondary | ICD-10-CM | POA: Diagnosis present

## 2023-11-18 DIAGNOSIS — O09892 Supervision of other high risk pregnancies, second trimester: Secondary | ICD-10-CM | POA: Insufficient documentation

## 2023-11-18 DIAGNOSIS — O99282 Endocrine, nutritional and metabolic diseases complicating pregnancy, second trimester: Secondary | ICD-10-CM | POA: Diagnosis not present

## 2023-11-18 DIAGNOSIS — O99842 Bariatric surgery status complicating pregnancy, second trimester: Secondary | ICD-10-CM | POA: Diagnosis not present

## 2023-11-18 DIAGNOSIS — D509 Iron deficiency anemia, unspecified: Secondary | ICD-10-CM | POA: Diagnosis not present

## 2023-11-18 DIAGNOSIS — E871 Hypo-osmolality and hyponatremia: Secondary | ICD-10-CM | POA: Diagnosis not present

## 2023-11-18 DIAGNOSIS — D519 Vitamin B12 deficiency anemia, unspecified: Secondary | ICD-10-CM | POA: Diagnosis not present

## 2023-11-18 DIAGNOSIS — E876 Hypokalemia: Secondary | ICD-10-CM | POA: Insufficient documentation

## 2023-11-18 LAB — URINALYSIS, ROUTINE W REFLEX MICROSCOPIC
Bilirubin Urine: NEGATIVE
Glucose, UA: NEGATIVE mg/dL
Hgb urine dipstick: NEGATIVE
Ketones, ur: 5 mg/dL — AB
Nitrite: NEGATIVE
Protein, ur: 30 mg/dL — AB
Specific Gravity, Urine: 1.028 (ref 1.005–1.030)
pH: 5 (ref 5.0–8.0)

## 2023-11-18 LAB — WET PREP, GENITAL
Clue Cells Wet Prep HPF POC: NONE SEEN
Sperm: NONE SEEN
Trich, Wet Prep: NONE SEEN
WBC, Wet Prep HPF POC: >=10 — AB (ref <10)

## 2023-11-18 LAB — CBC
HCT: 29 % — ABNORMAL LOW (ref 36.0–46.0)
Hemoglobin: 9.3 g/dL — ABNORMAL LOW (ref 12.0–15.0)
MCH: 21.8 pg — ABNORMAL LOW (ref 26.0–34.0)
MCHC: 32.1 g/dL (ref 30.0–36.0)
MCV: 67.9 fL — ABNORMAL LOW (ref 80.0–100.0)
Platelets: 278 K/uL (ref 150–400)
RBC: 4.27 MIL/uL (ref 3.87–5.11)
RDW: 26 % — ABNORMAL HIGH (ref 11.5–15.5)
WBC: 7.5 K/uL (ref 4.0–10.5)
nRBC: 0 % (ref 0.0–0.2)

## 2023-11-18 LAB — COMPREHENSIVE METABOLIC PANEL WITH GFR
ALT: 8 U/L (ref 0–44)
AST: 14 U/L — ABNORMAL LOW (ref 15–41)
Albumin: 3.1 g/dL — ABNORMAL LOW (ref 3.5–5.0)
Alkaline Phosphatase: 39 U/L (ref 38–126)
Anion gap: 9 (ref 5–15)
BUN: 7 mg/dL (ref 6–20)
CO2: 20 mmol/L — ABNORMAL LOW (ref 22–32)
Calcium: 8.9 mg/dL (ref 8.9–10.3)
Chloride: 105 mmol/L (ref 98–111)
Creatinine, Ser: 0.67 mg/dL (ref 0.44–1.00)
GFR, Estimated: >60 mL/min (ref >60)
Glucose, Bld: 102 mg/dL — ABNORMAL HIGH (ref 70–99)
Potassium: 3.4 mmol/L — ABNORMAL LOW (ref 3.5–5.1)
Sodium: 134 mmol/L — ABNORMAL LOW (ref 135–145)
Total Bilirubin: 0.5 mg/dL (ref 0.0–1.2)
Total Protein: 6.4 g/dL — ABNORMAL LOW (ref 6.5–8.1)

## 2023-11-18 MED ORDER — FLUCONAZOLE 150 MG PO TABS: 150.0000 mg | ORAL_TABLET | Freq: Every day | ORAL | 1 refills | Status: DC

## 2023-11-18 MED ORDER — COMPLETENATE 29-1 MG PO CHEW: 1.0000 | CHEWABLE_TABLET | Freq: Every day | ORAL | 2 refills | Status: DC

## 2023-11-18 MED ORDER — ACETAMINOPHEN 500 MG PO TABS
1000.0000 mg | ORAL_TABLET | Freq: Once | ORAL | Status: AC
  Administered 2023-11-18 (×2): 1000 mg via ORAL
  Filled 2023-11-18: qty 2

## 2023-11-18 MED ORDER — POTASSIUM CHLORIDE ER 10 MEQ PO TBCR: 10.0000 meq | EXTENDED_RELEASE_TABLET | Freq: Every day | ORAL | 0 refills | Status: DC

## 2023-11-18 MED ORDER — COMPLETENATE 29-1 MG PO CHEW: 1.0000 | CHEWABLE_TABLET | Freq: Every day | ORAL | 2 refills | Status: AC

## 2023-11-18 MED ORDER — CYANOCOBALAMIN 500 MCG PO TABS: 500.0000 ug | ORAL_TABLET | Freq: Every day | ORAL | 3 refills | Status: DC

## 2023-11-18 MED ORDER — ACCRUFER 30 MG PO CAPS: 1.0000 | ORAL_CAPSULE | Freq: Two times a day (BID) | ORAL | 5 refills | Status: DC

## 2023-11-18 MED ORDER — CYCLOBENZAPRINE HCL 5 MG PO TABS
10.0000 mg | ORAL_TABLET | Freq: Once | ORAL | Status: AC
  Administered 2023-11-18 (×2): 10 mg via ORAL
  Filled 2023-11-18: qty 2

## 2023-11-18 NOTE — Telephone Encounter (Signed)
 Patient called into the telephone triage line 8/9 at 10:13 am reporting vaginal bleeding, cramping, and decreased fetal movement. Patient is 26/[redacted] weeks pregnant. Patient was advised to be seen in the ER. Reviewed chart and I do not see that the patient went to the ER.  Spoke to patient. Patient states she was not able to be seen in the ER due to lack of child care. Friday 8/8 and Saturday 8/9 reports having cramping and braxton hicks contractions. Saturday morning she reports increased vaginal pressure, increased clear/white vaginal mucous, and bright red vaginal spotting with wiping after urination. Reports 2 episodes of light bright red bleeding and 2 episodes of light dark brown spotting all with wiping following urination. Denies bleeding since Saturday morning. Reports pain as a 7.5/10 on Sunday due to cramping, braxton hicks, and vaginal pain. Reports baby has been active and she feels movement often. Drinking plenty of fluids and resting as much as she can. Today she reports cramping, vaginal pain, pressure in her vagina and rectum, and braxton hicks contractions. Advised she will need to be evaluated. Offered an appointment today at 2:55 pm with Arland Roller, CNM. Patient declines. Offered appointment 8/12 with Arland Roller, CNM and for the patient to monitor symptoms and be seen in the ER overnight with new or worsening symptoms. Patient declines. Patient states she will be seen in the ER later today for evaluation. Advised evaluation is important. Patient verbalizes understanding.

## 2023-11-18 NOTE — MAU Provider Note (Signed)
 Chief Complaint:  Spotting and Abdominal Pain   HPI   None     Miranda Curtis is a 23 y.o. [id number] at [redacted]w[redacted]d presenting to maternity admissions reporting vaginal bleeding. She endorses spotting that started on 11/16/2023 and has continued through today.  She also endorses increased pelvic pressure, denies vaginal bleeding or leaking of fluid.  Denies recent intercourse.  Endorses good fetal movement.  She has been feeling what she believes are frequent Darol Irving, she has had water, snacks, and rested to alleviate them.  Pregnancy Course: Receives care at [facility]. Prenatal records reviewed.  History significant for gastric bypass, short interval between pregnancies, iron  deficiency anemia, B12 deficiency.  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-has resolved following surgery   Eczema    History of depression 11/18/2017   History of diabetes mellitus 04/11/2022   Maternal varicella, non-immune 05/09/2017   Missed abortion 04/07/2019   Vitamin B 12 deficiency 10/26/2017   05/09/22: b12 injection     Vitamin D  deficiency 05/09/2022   OB History  Gravida Para Term Preterm AB Living  6 2 2  3 2   SAB IAB Ectopic Multiple Live Births  2 1  0 2    # Outcome Date GA Lbr Len/2nd Weight Sex Type Anes PTL Lv  6 Current           5 IAB 02/2023          4 Term 10/25/22 [redacted]w[redacted]d 03:29 / 00:09 3010 g M Vag-Spont EPI  LIV  3 SAB 04/10/19 [redacted]w[redacted]d         2 Term 11/08/17 [redacted]w[redacted]d 21:32 / 00:51 3060 g F Vag-Spont EPI N LIV  1 SAB 10/07/16 [redacted]w[redacted]d          Past Surgical History:  Procedure Laterality Date   CHOLECYSTECTOMY N/A 03/13/2023   Procedure: LAPAROSCOPIC CHOLECYSTECTOMY WITH ICG DYE;  Surgeon: [name], MD;  Location: [facility] ORS;  Service: General;  Laterality: N/A;   DILATION AND EVACUATION N/A  02/24/2023   Procedure: DILATATION AND EVACUATION;  Surgeon: [name], MD;  Location: [facility] OR;  Service: Gynecology;  Laterality: N/A;   SLEEVE GASTROPLASTY  10/12/2021   UPPER GASTROINTESTINAL ENDOSCOPY N/A    Family History  Problem Relation Age of Onset   Diabetes Mother    Healthy Mother    Sickle cell trait Mother    Sickle cell trait Father    Rashes / Skin problems Father        eczema   Asthma Sister    Rashes / Skin problems Sister        eczema   Rashes / Skin problems Sister        eczema   Rashes / Skin problems Sister        eczema   Sickle cell anemia Sister    Rashes / Skin problems Sister        eczema   Autism Brother    Healthy Brother    Autism Daughter    Diabetes Maternal Grandmother    Hypertension Maternal Grandmother    Cancer Maternal Grandfather        prostate   Cancer Paternal Grandmother 73       lung   Healthy Paternal Grandfather    Ovarian cancer Neg Hx  Colon cancer Neg Hx    Breast cancer Neg Hx    Social History   Tobacco Use   Smoking status: Never   Smokeless tobacco: Never  Vaping Use   Vaping status: Never Used  Substance Use Topics   Alcohol use: No   Drug use: No   Allergies  Allergen Reactions   Reglan  [Metoclopramide ] Anxiety and Other (See Comments)    Severe panic attack   Medications Prior to Admission  Medication Sig Dispense Refill Last Dose/Taking   aspirin  EC 81 MG tablet Take 1 tablet (81 mg total) by mouth daily. Swallow whole. 100 tablet 12 Past Week   cyclobenzaprine  (FLEXERIL ) 10 MG tablet Take 1 tablet (10 mg total) by mouth 2 (two) times daily as needed. 20 tablet 0 Unknown   Ferric Maltol  (ACCRUFER ) 30 MG CAPS Take 1 capsule (30 mg total) by mouth 2 (two) times daily at 8 am and 10 pm. 60 capsule 5    ondansetron  (ZOFRAN -ODT) 4 MG disintegrating tablet Take 1 tablet (4 mg total) by mouth every 8 (eight) hours as needed. 60 tablet 0 Unknown   vitamin B-12 (VITAMIN B12) 500 MCG tablet Take 1  tablet (500 mcg total) by mouth daily. 30 tablet 3    [DISCONTINUED] potassium chloride  (KLOR-CON ) 10 MEQ tablet Take 1 tablet (10 mEq total) by mouth daily. 14 tablet 0    [DISCONTINUED] prenatal vitamin w/FE, FA (NATACHEW) 29-1 MG CHEW chewable tablet Chew 1 tablet by mouth daily at 12 noon. 30 tablet 2     I have reviewed patient's Past Medical Hx, Surgical Hx, Family Hx, Social Hx, medications and allergies.   ROS  Pertinent items noted in HPI and remainder of comprehensive ROS otherwise negative.   PHYSICAL EXAM  Patient Vitals for the past 24 hrs:  BP Temp Temp src Pulse Resp SpO2 Height Weight  11/18/23 1930 -- -- -- -- -- 100 % -- --  11/18/23 1900 -- -- -- -- -- 100 % -- --  11/18/23 1729 111/62 98.3 F (36.8 C) Oral 99 18 100 % -- --  11/18/23 1722 -- -- -- -- -- -- 5' 1 (1.549 m) 91.9 kg    Constitutional: Well-developed, well-nourished female in no acute distress.  HEENT: atraumatic, normocephalic. Neck has normal ROM. EOM intact. Cardiovascular: normal rate & rhythm, warm and well-perfused Respiratory: normal effort, no problems with respiration noted GI: Abd soft, non-tender, non-distended MSK: Extremities nontender, no edema, normal ROM Skin: warm and dry. Acyanotic, no jaundice or pallor. Neurologic: Alert and oriented x 4. No abnormal coordination. Psychiatric: Normal mood. Speech not slurred, not rapid/pressured. Patient is cooperative. GU: no CVA tenderness Pelvic exam: VULVA: normal appearing vulva with no masses, tenderness or lesions, VAGINA: thick white discharge, CERVIX: multiparous os, visually closed, thick white discharge, exam chaperoned by Vernell Ruddle RN.      Fetal Tracing: Baseline FHR: 145 per minute Fetal heart variability: moderate Fetal Heart Rate accelerations: yes Fetal Heart Rate decelerations: none Fetal Non-stress Test: Category I (reactive) Toco: uterine irritability   Labs: Results for orders placed or performed during the  hospital encounter of 11/18/23 (from the past 24 hours)  Urinalysis, Routine w reflex microscopic -Urine, Clean Catch     Status: Abnormal   Collection Time: 11/18/23  5:38 PM  Result Value Ref Range   Color, Urine YELLOW YELLOW   APPearance HAZY (A) CLEAR   Specific Gravity, Urine 1.028 1.005 - 1.030   pH 5.0 5.0 - 8.0   Glucose,  UA NEGATIVE NEGATIVE mg/dL   Hgb urine dipstick NEGATIVE NEGATIVE   Bilirubin Urine NEGATIVE NEGATIVE   Ketones, ur 5 (A) NEGATIVE mg/dL   Protein, ur 30 (A) NEGATIVE mg/dL   Nitrite NEGATIVE NEGATIVE   Leukocytes,Ua LARGE (A) NEGATIVE   RBC / HPF 0-5 0 - 5 RBC/hpf   WBC, UA 21-50 0 - 5 WBC/hpf   Bacteria, UA FEW (A) NONE SEEN   Squamous Epithelial / HPF 6-10 0 - 5 /HPF   Mucus PRESENT   Wet prep, genital     Status: Abnormal   Collection Time: 11/18/23  7:20 PM  Result Value Ref Range   Yeast Wet Prep HPF POC PRESENT (A) NONE SEEN   Trich, Wet Prep NONE SEEN NONE SEEN   Clue Cells Wet Prep HPF POC NONE SEEN NONE SEEN   WBC, Wet Prep HPF POC >=10 (A) <10   Sperm NONE SEEN   CBC     Status: Abnormal   Collection Time: 11/18/23  7:39 PM  Result Value Ref Range   WBC 7.5 4.0 - 10.5 K/uL   RBC 4.27 3.87 - 5.11 MIL/uL   Hemoglobin 9.3 (L) 12.0 - 15.0 g/dL   HCT 70.9 (L) 63.9 - 53.9 %   MCV 67.9 (L) 80.0 - 100.0 fL   MCH 21.8 (L) 26.0 - 34.0 pg   MCHC 32.1 30.0 - 36.0 g/dL   RDW 73.9 (H) 88.4 - 84.4 %   Platelets 278 150 - 400 K/uL   nRBC 0.0 0.0 - 0.2 %  Comprehensive metabolic panel with GFR     Status: Abnormal   Collection Time: 11/18/23  7:39 PM  Result Value Ref Range   Sodium 134 (L) 135 - 145 mmol/L   Potassium 3.4 (L) 3.5 - 5.1 mmol/L   Chloride 105 98 - 111 mmol/L   CO2 20 (L) 22 - 32 mmol/L   Glucose, Bld 102 (H) 70 - 99 mg/dL   BUN 7 6 - 20 mg/dL   Creatinine, Ser 9.32 0.44 - 1.00 mg/dL   Calcium 8.9 8.9 - 89.6 mg/dL   Total Protein 6.4 (L) 6.5 - 8.1 g/dL   Albumin 3.1 (L) 3.5 - 5.0 g/dL   AST 14 (L) 15 - 41 U/L   ALT 8 0 - 44  U/L   Alkaline Phosphatase 39 38 - 126 U/L   Total Bilirubin 0.5 0.0 - 1.2 mg/dL   GFR, Estimated >39 >39 mL/min   Anion gap 9 5 - 15    Imaging:  No results found.  MDM & MAU COURSE  MDM: High  MAU Course: -Vital signs within normal limits. -CBC and CMP for anemia and electrolytes. -UA and wet prep for infection. -UA negative. Wet prep positive for yeast infection, treat with fluconazole . -CBC with unchanged anemia, CMP with mild hypokalemia and mild hyponatremia.  Differential diagnosis considered for abdominal pain includes but is not limited to: round ligament pain, labor, UTI, pyelonephritis, PID, cervicitis, placental abruption, chorioamnionitis   Orders Placed This Encounter  Procedures   Wet prep, genital   Urinalysis, Routine w reflex microscopic -Urine, Clean Catch   CBC   Comprehensive metabolic panel with GFR   Discharge patient   Meds ordered this encounter  Medications   acetaminophen  (TYLENOL ) tablet 1,000 mg   cyclobenzaprine  (FLEXERIL ) tablet 10 mg   potassium chloride  (KLOR-CON ) 10 MEQ tablet    Sig: Take 1 tablet (10 mEq total) by mouth daily.    Dispense:  14 tablet  Refill:  0   prenatal vitamin w/FE, FA (NATACHEW) 29-1 MG CHEW chewable tablet    Sig: Chew 1 tablet by mouth daily at 12 noon.    Dispense:  30 tablet    Refill:  2   fluconazole  (DIFLUCAN ) 150 MG tablet    Sig: Take 1 tablet (150 mg total) by mouth daily.    Dispense:  1 tablet    Refill:  1    ASSESSMENT   1. Vaginal bleeding in pregnancy, second trimester   2. Vulvovaginal candidiasis   3. [redacted] weeks gestation of pregnancy      PLAN  Discharge home in stable condition with return precautions.  Fluconazole  for yeast infection. Maternity support belt for pelvic pressure.   Follow-up Information     [facility] Follow up.   Specialty: Obstetrics and Gynecology Why: As scheduled for ongoing prenatal care Contact  information: [address] [place]  [address] [phone]                 Allergies as of 11/18/2023       Reactions   Reglan  [metoclopramide ] Anxiety, Other (See Comments)   Severe panic attack        Medication List     TAKE these medications    ACCRUFeR  30 MG Caps Generic drug: Ferric Maltol  Take 1 capsule (30 mg total) by mouth 2 (two) times daily at 8 am and 10 pm.   aspirin  EC 81 MG tablet Take 1 tablet (81 mg total) by mouth daily. Swallow whole.   cyanocobalamin  500 MCG tablet Commonly known as: VITAMIN B12 Take 1 tablet (500 mcg total) by mouth daily.   cyclobenzaprine  10 MG tablet Commonly known as: FLEXERIL  Take 1 tablet (10 mg total) by mouth 2 (two) times daily as needed.   fluconazole  150 MG tablet Commonly known as: Diflucan  Take 1 tablet (150 mg total) by mouth daily.   ondansetron  4 MG disintegrating tablet Commonly known as: ZOFRAN -ODT Take 1 tablet (4 mg total) by mouth every 8 (eight) hours as needed.   potassium chloride  10 MEQ tablet Commonly known as: KLOR-CON  Take 1 tablet (10 mEq total) by mouth daily.   prenatal vitamin w/FE, FA 29-1 MG Chew chewable tablet Chew 1 tablet by mouth daily at 12 noon.        Joesph DELENA Sear, PA

## 2023-11-18 NOTE — Discharge Instructions (Signed)
 PRENATAL VITAMINS & SUPPLEMENTS: Please start taking your prenatal vitamins and supplements for iron  and vitamin B12 if you are not doing so already.  Since you have a history of gastric bypass surgery, this means that your body does not absorb as much of the vitamins that you take in as it used to.  It is very important that you continue to take your vitamins and supplements in order to ensure that the amount that your body absorbs is still adequate for your body function.  Because you are not absorbing as much of what you take in, you likely will need to take in much higher amounts of vitamins and supplements as a starting point.  For example, for a 100 mg vitamin D  supplement, you may have previously absorbed the full 100 mg but now will only absorb 500 mg.  You will need to take a higher starting dose of 200 mg in order to absorb the 100 mg that you need.  YEAST INFECTION: I have sent a prescription for fluconazole  (Diflucan ) to the pharmacy for you.  PELVIC/ABDOMINAL PAIN: Up to 80 percent of women experience groin pain at some point during pregnancy, mostly in that final trimester when stress on the pelvic region is especially intense. Your increasingly heavy baby is burrowing deeper into your pelvis in preparation for birth, and their head is now pressing hard against your bladder, rectum, hips and pelvic bones. The result is an ever-increasing stress on the joints, muscles and organs in your pelvis and back. Soak in a warm tub (not too hot) Tylenol  1000 mg by mouth every 6-8 hrs as needed for pain Slow position changes Laying on the affected side Wear a maternity support belt during the day. Take it off while sleeping. Apply a heating pad to your lower back for 20 minutes at a time, taking at least a 20-minute break before applying again. Massage: Starting at the middle of your pubic bone, trace little circles in a wide U from your pubic bone to your hip bones on both sides.  Then starting  just above your pubic bone, press in and down, alternating sides to create a gentle rocking of your uterus back and forth.  Move your hands up the sides of your belly and back down. Do this 3-5 times upon waking and before bed.  Stretches: Get on hands and knees and alternate arching your back deeply while inhaling, and then rounding your back while exhaling. Modified runners lunge:  Sit on a chair with half of your bottom on the chair and half off.  Sit up tall, plant your front foot, and stretch your other foot out behind you.  Breathe deeply for 5 breaths and then do the other side. Chiropractor or massage therapy: [facility] in [place] specializes in pregnancy care. You can visit their website at: [email or web address] to schedule a new patient chiropractic treatment (1 hour) appointment. Please let us  know if you have any other questions or concerns.

## 2023-11-18 NOTE — MAU Provider Note (Signed)
 Chief Complaint:  Spotting and Abdominal Pain   HPI   None     Lateefa Crosby is a 23 y.o. [id number] at [redacted]w[redacted]d presenting to maternity admissions reporting vaginal bleeding. She endorses spotting that started on 11/16/2023 and has continued through today.  She also endorses increased pelvic pressure, denies vaginal bleeding or leaking of fluid.  Denies recent intercourse.  Endorses good fetal movement.  She has been feeling what she believes are frequent Darol Irving, she has had water, snacks, and rested to alleviate them.  Pregnancy Course: Receives care at [facility]. Prenatal records reviewed.  History significant for gastric bypass, short interval between pregnancies, iron  deficiency anemia, B12 deficiency.  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-has resolved following surgery   Eczema    History of depression 11/18/2017   History of diabetes mellitus 04/11/2022   Maternal varicella, non-immune 05/09/2017   Missed abortion 04/07/2019   Vitamin B 12 deficiency 10/26/2017   05/09/22: b12 injection     Vitamin D  deficiency 05/09/2022   OB History  Gravida Para Term Preterm AB Living  6 2 2  3 2   SAB IAB Ectopic Multiple Live Births  2 1  0 2    # Outcome Date GA Lbr Len/2nd Weight Sex Type Anes PTL Lv  6 Current           5 IAB 02/2023          4 Term 10/25/22 [redacted]w[redacted]d 03:29 / 00:09 3010 g M Vag-Spont EPI  LIV  3 SAB 04/10/19 [redacted]w[redacted]d         2 Term 11/08/17 [redacted]w[redacted]d 21:32 / 00:51 3060 g F Vag-Spont EPI N LIV  1 SAB 10/07/16 [redacted]w[redacted]d          Past Surgical History:  Procedure Laterality Date   CHOLECYSTECTOMY N/A 03/13/2023   Procedure: LAPAROSCOPIC CHOLECYSTECTOMY WITH ICG DYE;  Surgeon: [name], MD;  Location: [facility] ORS;  Service: General;  Laterality: N/A;   DILATION AND EVACUATION N/A  02/24/2023   Procedure: DILATATION AND EVACUATION;  Surgeon: [name], MD;  Location: [facility] OR;  Service: Gynecology;  Laterality: N/A;   SLEEVE GASTROPLASTY  10/12/2021   UPPER GASTROINTESTINAL ENDOSCOPY N/A    Family History  Problem Relation Age of Onset   Diabetes Mother    Healthy Mother    Sickle cell trait Mother    Sickle cell trait Father    Rashes / Skin problems Father        eczema   Asthma Sister    Rashes / Skin problems Sister        eczema   Rashes / Skin problems Sister        eczema   Rashes / Skin problems Sister        eczema   Sickle cell anemia Sister    Rashes / Skin problems Sister        eczema   Autism Brother    Healthy Brother    Autism Daughter    Diabetes Maternal Grandmother    Hypertension Maternal Grandmother    Cancer Maternal Grandfather        prostate   Cancer Paternal Grandmother 73       lung   Healthy Paternal Grandfather    Ovarian cancer Neg Hx  Colon cancer Neg Hx    Breast cancer Neg Hx    Social History   Tobacco Use   Smoking status: Never   Smokeless tobacco: Never  Vaping Use   Vaping status: Never Used  Substance Use Topics   Alcohol use: No   Drug use: No   Allergies  Allergen Reactions   Reglan  [Metoclopramide ] Anxiety and Other (See Comments)    Severe panic attack   Medications Prior to Admission  Medication Sig Dispense Refill Last Dose/Taking   aspirin  EC 81 MG tablet Take 1 tablet (81 mg total) by mouth daily. Swallow whole. 100 tablet 12 Past Week   cyclobenzaprine  (FLEXERIL ) 10 MG tablet Take 1 tablet (10 mg total) by mouth 2 (two) times daily as needed. 20 tablet 0 Unknown   Ferric Maltol  (ACCRUFER ) 30 MG CAPS Take 1 capsule (30 mg total) by mouth 2 (two) times daily at 8 am and 10 pm. 60 capsule 5    ondansetron  (ZOFRAN -ODT) 4 MG disintegrating tablet Take 1 tablet (4 mg total) by mouth every 8 (eight) hours as needed. 60 tablet 0 Unknown   vitamin B-12 (VITAMIN B12) 500 MCG tablet Take 1  tablet (500 mcg total) by mouth daily. 30 tablet 3    [DISCONTINUED] potassium chloride  (KLOR-CON ) 10 MEQ tablet Take 1 tablet (10 mEq total) by mouth daily. 14 tablet 0    [DISCONTINUED] prenatal vitamin w/FE, FA (NATACHEW) 29-1 MG CHEW chewable tablet Chew 1 tablet by mouth daily at 12 noon. 30 tablet 2     I have reviewed patient's Past Medical Hx, Surgical Hx, Family Hx, Social Hx, medications and allergies.   ROS  Pertinent items noted in HPI and remainder of comprehensive ROS otherwise negative.   PHYSICAL EXAM  Patient Vitals for the past 24 hrs:  BP Temp Temp src Pulse Resp SpO2 Height Weight  11/18/23 1930 -- -- -- -- -- 100 % -- --  11/18/23 1900 -- -- -- -- -- 100 % -- --  11/18/23 1729 111/62 98.3 F (36.8 C) Oral 99 18 100 % -- --  11/18/23 1722 -- -- -- -- -- -- 5' 1 (1.549 m) 91.9 kg    Constitutional: Well-developed, well-nourished female in no acute distress.  HEENT: atraumatic, normocephalic. Neck has normal ROM. EOM intact. Cardiovascular: normal rate & rhythm, warm and well-perfused Respiratory: normal effort, no problems with respiration noted GI: Abd soft, non-tender, non-distended MSK: Extremities nontender, no edema, normal ROM Skin: warm and dry. Acyanotic, no jaundice or pallor. Neurologic: Alert and oriented x 4. No abnormal coordination. Psychiatric: Normal mood. Speech not slurred, not rapid/pressured. Patient is cooperative. GU: no CVA tenderness Pelvic exam: VULVA: normal appearing vulva with no masses, tenderness or lesions, VAGINA: thick white discharge, CERVIX: multiparous os, visually closed, thick white discharge, exam chaperoned by Vernell Ruddle RN.      Fetal Tracing: Baseline FHR: 145 per minute Fetal heart variability: moderate Fetal Heart Rate accelerations: yes Fetal Heart Rate decelerations: none Fetal Non-stress Test: Category I (reactive) Toco: uterine irritability   Labs: Results for orders placed or performed during the  hospital encounter of 11/18/23 (from the past 24 hours)  Urinalysis, Routine w reflex microscopic -Urine, Clean Catch     Status: Abnormal   Collection Time: 11/18/23  5:38 PM  Result Value Ref Range   Color, Urine YELLOW YELLOW   APPearance HAZY (A) CLEAR   Specific Gravity, Urine 1.028 1.005 - 1.030   pH 5.0 5.0 - 8.0   Glucose,  UA NEGATIVE NEGATIVE mg/dL   Hgb urine dipstick NEGATIVE NEGATIVE   Bilirubin Urine NEGATIVE NEGATIVE   Ketones, ur 5 (A) NEGATIVE mg/dL   Protein, ur 30 (A) NEGATIVE mg/dL   Nitrite NEGATIVE NEGATIVE   Leukocytes,Ua LARGE (A) NEGATIVE   RBC / HPF 0-5 0 - 5 RBC/hpf   WBC, UA 21-50 0 - 5 WBC/hpf   Bacteria, UA FEW (A) NONE SEEN   Squamous Epithelial / HPF 6-10 0 - 5 /HPF   Mucus PRESENT   Wet prep, genital     Status: Abnormal   Collection Time: 11/18/23  7:20 PM  Result Value Ref Range   Yeast Wet Prep HPF POC PRESENT (A) NONE SEEN   Trich, Wet Prep NONE SEEN NONE SEEN   Clue Cells Wet Prep HPF POC NONE SEEN NONE SEEN   WBC, Wet Prep HPF POC >=10 (A) <10   Sperm NONE SEEN   CBC     Status: Abnormal   Collection Time: 11/18/23  7:39 PM  Result Value Ref Range   WBC 7.5 4.0 - 10.5 K/uL   RBC 4.27 3.87 - 5.11 MIL/uL   Hemoglobin 9.3 (L) 12.0 - 15.0 g/dL   HCT 70.9 (L) 63.9 - 53.9 %   MCV 67.9 (L) 80.0 - 100.0 fL   MCH 21.8 (L) 26.0 - 34.0 pg   MCHC 32.1 30.0 - 36.0 g/dL   RDW 73.9 (H) 88.4 - 84.4 %   Platelets 278 150 - 400 K/uL   nRBC 0.0 0.0 - 0.2 %  Comprehensive metabolic panel with GFR     Status: Abnormal   Collection Time: 11/18/23  7:39 PM  Result Value Ref Range   Sodium 134 (L) 135 - 145 mmol/L   Potassium 3.4 (L) 3.5 - 5.1 mmol/L   Chloride 105 98 - 111 mmol/L   CO2 20 (L) 22 - 32 mmol/L   Glucose, Bld 102 (H) 70 - 99 mg/dL   BUN 7 6 - 20 mg/dL   Creatinine, Ser 9.32 0.44 - 1.00 mg/dL   Calcium 8.9 8.9 - 89.6 mg/dL   Total Protein 6.4 (L) 6.5 - 8.1 g/dL   Albumin 3.1 (L) 3.5 - 5.0 g/dL   AST 14 (L) 15 - 41 U/L   ALT 8 0 - 44  U/L   Alkaline Phosphatase 39 38 - 126 U/L   Total Bilirubin 0.5 0.0 - 1.2 mg/dL   GFR, Estimated >39 >39 mL/min   Anion gap 9 5 - 15    Imaging:  No results found.  MDM & MAU COURSE  MDM: High  MAU Course: -Vital signs within normal limits. -CBC and CMP for anemia and electrolytes. -UA and wet prep for infection. -UA negative. Wet prep positive for yeast infection, treat with fluconazole . -CBC with unchanged anemia, CMP with mild hypokalemia and mild hyponatremia.  Differential diagnosis considered for abdominal pain includes but is not limited to: round ligament pain, labor, UTI, pyelonephritis, PID, cervicitis, placental abruption, chorioamnionitis   Orders Placed This Encounter  Procedures   Wet prep, genital   Urinalysis, Routine w reflex microscopic -Urine, Clean Catch   CBC   Comprehensive metabolic panel with GFR   Discharge patient   Meds ordered this encounter  Medications   acetaminophen  (TYLENOL ) tablet 1,000 mg   cyclobenzaprine  (FLEXERIL ) tablet 10 mg   potassium chloride  (KLOR-CON ) 10 MEQ tablet    Sig: Take 1 tablet (10 mEq total) by mouth daily.    Dispense:  14 tablet  Refill:  0   prenatal vitamin w/FE, FA (NATACHEW) 29-1 MG CHEW chewable tablet    Sig: Chew 1 tablet by mouth daily at 12 noon.    Dispense:  30 tablet    Refill:  2   fluconazole  (DIFLUCAN ) 150 MG tablet    Sig: Take 1 tablet (150 mg total) by mouth daily.    Dispense:  1 tablet    Refill:  1    ASSESSMENT   1. Vaginal bleeding in pregnancy, second trimester   2. Vulvovaginal candidiasis   3. [redacted] weeks gestation of pregnancy      PLAN  Discharge home in stable condition with return precautions.  Fluconazole  for yeast infection. Maternity support belt for pelvic pressure.   Follow-up Information     [facility] Follow up.   Specialty: Obstetrics and Gynecology Why: As scheduled for ongoing prenatal care Contact  information: [address] [place]  [address] [phone]                 Allergies as of 11/18/2023       Reactions   Reglan  [metoclopramide ] Anxiety, Other (See Comments)   Severe panic attack        Medication List     TAKE these medications    ACCRUFeR  30 MG Caps Generic drug: Ferric Maltol  Take 1 capsule (30 mg total) by mouth 2 (two) times daily at 8 am and 10 pm.   aspirin  EC 81 MG tablet Take 1 tablet (81 mg total) by mouth daily. Swallow whole.   cyanocobalamin  500 MCG tablet Commonly known as: VITAMIN B12 Take 1 tablet (500 mcg total) by mouth daily.   cyclobenzaprine  10 MG tablet Commonly known as: FLEXERIL  Take 1 tablet (10 mg total) by mouth 2 (two) times daily as needed.   fluconazole  150 MG tablet Commonly known as: Diflucan  Take 1 tablet (150 mg total) by mouth daily.   ondansetron  4 MG disintegrating tablet Commonly known as: ZOFRAN -ODT Take 1 tablet (4 mg total) by mouth every 8 (eight) hours as needed.   potassium chloride  10 MEQ tablet Commonly known as: KLOR-CON  Take 1 tablet (10 mEq total) by mouth daily.   prenatal vitamin w/FE, FA 29-1 MG Chew chewable tablet Chew 1 tablet by mouth daily at 12 noon.        Joesph DELENA Sear, PA

## 2023-11-18 NOTE — MAU Note (Signed)
 Miranda Curtis is a 23 y.o. at [redacted]w[redacted]d here in MAU reporting: she had spotting on Saturday and cramping that began Saturday that has continued today.  Also reports she's having increased pelvis pressure that causes difficulty walking, does not have a support band.  Denies red VB or of LOF.  No recent intercourse.  Endorses +FM.  LMP: NA Onset of complaint: Saturday Pain score: 6 Vitals:   11/18/23 1729  BP: 111/62  Pulse: 99  Resp: 18  Temp: 98.3 F (36.8 C)  SpO2: 100%     FHT: 147 bpm  Lab orders placed from triage: UA

## 2023-11-19 NOTE — Telephone Encounter (Signed)
 Miranda Curtis,  Routing to you as RICK that patient was evaluated in the ED yesterday 8/11.

## 2023-11-20 ENCOUNTER — Encounter (HOSPITAL_BASED_OUTPATIENT_CLINIC_OR_DEPARTMENT_OTHER): Payer: Self-pay | Admitting: Certified Nurse Midwife

## 2023-11-28 ENCOUNTER — Other Ambulatory Visit: Payer: Self-pay

## 2023-11-28 ENCOUNTER — Ambulatory Visit (HOSPITAL_BASED_OUTPATIENT_CLINIC_OR_DEPARTMENT_OTHER): Payer: MEDICAID

## 2023-11-28 ENCOUNTER — Ambulatory Visit: Payer: MEDICAID | Attending: Maternal & Fetal Medicine | Admitting: Maternal & Fetal Medicine

## 2023-11-28 DIAGNOSIS — F32A Depression, unspecified: Secondary | ICD-10-CM

## 2023-11-28 DIAGNOSIS — O99210 Obesity complicating pregnancy, unspecified trimester: Secondary | ICD-10-CM

## 2023-11-28 DIAGNOSIS — O09893 Supervision of other high risk pregnancies, third trimester: Secondary | ICD-10-CM | POA: Insufficient documentation

## 2023-11-28 DIAGNOSIS — O99213 Obesity complicating pregnancy, third trimester: Secondary | ICD-10-CM | POA: Diagnosis present

## 2023-11-28 DIAGNOSIS — F419 Anxiety disorder, unspecified: Secondary | ICD-10-CM | POA: Diagnosis not present

## 2023-11-28 DIAGNOSIS — O99843 Bariatric surgery status complicating pregnancy, third trimester: Secondary | ICD-10-CM | POA: Diagnosis not present

## 2023-11-28 DIAGNOSIS — O99343 Other mental disorders complicating pregnancy, third trimester: Secondary | ICD-10-CM | POA: Diagnosis not present

## 2023-11-28 DIAGNOSIS — X58XXXA Exposure to other specified factors, initial encounter: Secondary | ICD-10-CM | POA: Insufficient documentation

## 2023-11-28 DIAGNOSIS — F909 Attention-deficit hyperactivity disorder, unspecified type: Secondary | ICD-10-CM

## 2023-11-28 DIAGNOSIS — O9A213 Injury, poisoning and certain other consequences of external causes complicating pregnancy, third trimester: Secondary | ICD-10-CM | POA: Insufficient documentation

## 2023-11-28 DIAGNOSIS — Z3482 Encounter for supervision of other normal pregnancy, second trimester: Secondary | ICD-10-CM

## 2023-11-28 DIAGNOSIS — T1490XA Injury, unspecified, initial encounter: Secondary | ICD-10-CM | POA: Insufficient documentation

## 2023-11-28 DIAGNOSIS — O09899 Supervision of other high risk pregnancies, unspecified trimester: Secondary | ICD-10-CM

## 2023-11-28 DIAGNOSIS — Z9884 Bariatric surgery status: Secondary | ICD-10-CM

## 2023-11-28 DIAGNOSIS — O99841 Bariatric surgery status complicating pregnancy, first trimester: Secondary | ICD-10-CM | POA: Diagnosis not present

## 2023-11-28 DIAGNOSIS — Z3A28 28 weeks gestation of pregnancy: Secondary | ICD-10-CM | POA: Diagnosis not present

## 2023-11-28 DIAGNOSIS — Z362 Encounter for other antenatal screening follow-up: Secondary | ICD-10-CM

## 2023-11-28 DIAGNOSIS — D509 Iron deficiency anemia, unspecified: Secondary | ICD-10-CM

## 2023-11-28 NOTE — Progress Notes (Signed)
 Patient information  Patient Name: Miranda Curtis  Patient MRN:   [id number]  Referring practice: MFM Referring Provider: [facility]  Problem List   Patient Active Problem List   Diagnosis Date Noted   COVID-19 affecting pregnancy in second trimester 11/11/2023   History of bariatric surgery 10/13/2023   Recurrent syncope 10/05/2023   B12 deficiency 10/05/2023   Iron  deficiency anemia 10/03/2023   Obesity affecting pregnancy, antepartum 09/16/2023   Short interval between pregnancies affecting pregnancy, antepartum 08/12/2023   Maternal varicella, non-immune 08/12/2023   Supervision of normal pregnancy 07/29/2023   Anxiety and depression 07/29/2023   ADHD 07/29/2023   History of gastric bypass 04/11/2022    Maternal Fetal medicine Consult  Miranda Curtis is a 23 y.o. [id number] at [redacted]w[redacted]d here for ultrasound and consultation. Miranda Curtis is doing well today with no acute concerns. Today we focused on the following:   The patient's pregnancy is complicated by an elevated BMI, prior bariatric surgery, as well as a short interval pregnancy.  The fetal biometry is normal today at the 51st percentile.  Due to her medical problems she will be followed with another growth ultrasound in 6 weeks or sooner if other indications arise.   The patient is also reporting presyncopal and syncopal episodes.  She denies any inciting or triggering causes and states it does not change with position changes or prolonged standing.  She has an appoint with cardiology and knows to go to the [facility] with any concerns.  The patient had time to ask questions that were answered to her satisfaction.  She verbalized understanding and agrees to proceed with the plan below.  Sonographic findings Single intrauterine pregnancy at 28w 1d.  Fetal cardiac activity:  Observed and appears normal. Presentation: Cephalic. Interval fetal anatomy appears normal. Fetal biometry shows the estimated fetal weight at the  51 percentile. Amniotic fluid volume: Within normal limits. MVP: 4.21 cm. Placenta: Anterior Fundal.  There are limitations of prenatal ultrasound such as the inability to detect certain abnormalities due to poor visualization. Various factors such as fetal position, gestational age and maternal body habitus may increase the difficulty in visualizing the fetal anatomy.    Recommendations - Follow-up growth ultrasound in 6 weeks or sooner if other indications arise.  - Precautions given about her syncopal episodes.  The patient has an appointment with cardiology. I do not recommend driving until the underlying etiology has been determined and she is no longer having these episodes.  Review of Systems: A review of systems was performed and was negative except per HPI   Vitals and Physical Exam    11/28/2023    9:11 AM 11/18/2023    9:00 PM 11/18/2023    5:45 PM  Vitals with BMI  Systolic 100 113 887  Diastolic 63 70 70  Pulse 106 85 98    Sitting comfortably on the sonogram table Nonlabored breathing Normal rate and rhythm Abdomen is nontender  Past pregnancies OB History  Gravida Para Term Preterm AB Living  6 2 2  3 2   SAB IAB Ectopic Multiple Live Births  2 1  0 2    # Outcome Date GA Lbr Len/2nd Weight Sex Type Anes PTL Lv  6 Current           5 IAB 02/2023          4 Term 10/25/22 [redacted]w[redacted]d 03:29 / 00:09 6 lb 10.2 oz (3.01 kg) M Vag-Spont EPI  LIV  3 SAB 04/10/19  [redacted]w[redacted]d         2 Term 11/08/17 [redacted]w[redacted]d 21:32 / 00:51 6 lb 11.9 oz (3.06 kg) F Vag-Spont EPI N LIV  1 SAB 10/07/16 [redacted]w[redacted]d            I spent 30 minutes reviewing the patients chart, including labs and images as well as counseling the patient about her medical conditions. Greater than 50% of the time was spent in direct face-to-face patient counseling.  Delora Smaller  MFM, [facility]   11/28/2023  11:44 AM

## 2023-12-02 ENCOUNTER — Encounter (HOSPITAL_COMMUNITY): Payer: Self-pay | Admitting: Obstetrics and Gynecology

## 2023-12-02 ENCOUNTER — Inpatient Hospital Stay (HOSPITAL_BASED_OUTPATIENT_CLINIC_OR_DEPARTMENT_OTHER): Payer: MEDICAID

## 2023-12-02 ENCOUNTER — Inpatient Hospital Stay (HOSPITAL_COMMUNITY)
Admission: AD | Admit: 2023-12-02 | Discharge: 2023-12-02 | Disposition: A | Discharge status: Home / Self Care | Payer: MEDICAID | Attending: Obstetrics and Gynecology | Admitting: Obstetrics and Gynecology

## 2023-12-02 ENCOUNTER — Encounter (HOSPITAL_BASED_OUTPATIENT_CLINIC_OR_DEPARTMENT_OTHER): Payer: Self-pay | Admitting: Obstetrics & Gynecology

## 2023-12-02 DIAGNOSIS — F419 Anxiety disorder, unspecified: Secondary | ICD-10-CM | POA: Diagnosis not present

## 2023-12-02 DIAGNOSIS — Z369 Encounter for antenatal screening, unspecified: Secondary | ICD-10-CM | POA: Diagnosis not present

## 2023-12-02 DIAGNOSIS — O26853 Spotting complicating pregnancy, third trimester: Secondary | ICD-10-CM | POA: Insufficient documentation

## 2023-12-02 DIAGNOSIS — W109XXA Fall (on) (from) unspecified stairs and steps, initial encounter: Secondary | ICD-10-CM | POA: Insufficient documentation

## 2023-12-02 DIAGNOSIS — W19XXXA Unspecified fall, initial encounter: Secondary | ICD-10-CM | POA: Diagnosis not present

## 2023-12-02 DIAGNOSIS — O9A213 Injury, poisoning and certain other consequences of external causes complicating pregnancy, third trimester: Secondary | ICD-10-CM | POA: Diagnosis not present

## 2023-12-02 DIAGNOSIS — T1490XA Injury, unspecified, initial encounter: Secondary | ICD-10-CM | POA: Diagnosis not present

## 2023-12-02 DIAGNOSIS — O99343 Other mental disorders complicating pregnancy, third trimester: Secondary | ICD-10-CM | POA: Diagnosis not present

## 2023-12-02 DIAGNOSIS — Z3A28 28 weeks gestation of pregnancy: Secondary | ICD-10-CM | POA: Diagnosis not present

## 2023-12-02 DIAGNOSIS — O99213 Obesity complicating pregnancy, third trimester: Secondary | ICD-10-CM | POA: Diagnosis not present

## 2023-12-02 DIAGNOSIS — E669 Obesity, unspecified: Secondary | ICD-10-CM

## 2023-12-02 DIAGNOSIS — O99843 Bariatric surgery status complicating pregnancy, third trimester: Secondary | ICD-10-CM | POA: Diagnosis not present

## 2023-12-02 DIAGNOSIS — N939 Abnormal uterine and vaginal bleeding, unspecified: Secondary | ICD-10-CM

## 2023-12-02 DIAGNOSIS — O368130 Decreased fetal movements, third trimester, not applicable or unspecified: Secondary | ICD-10-CM | POA: Insufficient documentation

## 2023-12-02 DIAGNOSIS — O09893 Supervision of other high risk pregnancies, third trimester: Secondary | ICD-10-CM | POA: Insufficient documentation

## 2023-12-02 NOTE — MAU Note (Signed)
 Miranda Curtis is a 23 y.o. at [redacted]w[redacted]d here in MAU reporting: was walking up the steps last night, tripped and fell, hit her abd.  When she got up during the night to go to the bathroom, she saw some spotting, has continued, but only seeing bright red specks when she wipes now. Has had some cramping today. Her dr told her to come in. Is feeling movement, not as busy as usual.  Onset of complaint: during Pain score: mod/severe Vitals:   12/02/23 1654  BP: 115/68  Pulse: (!) 102  Resp: 17  Temp: 98.2 F (36.8 C)  SpO2: 100%     FHT:154 Lab orders placed from triage:     Abd soft on palpation, no guarding.

## 2023-12-02 NOTE — Discharge Instructions (Signed)

## 2023-12-02 NOTE — MAU Provider Note (Addendum)
 Chief Complaint: Fall, Vaginal Bleeding, Decreased Fetal Movement, and Abdominal Pain   None    SUBJECTIVE HPI: Notnamed Croucher is a 23 y.o. [phone] at [redacted]w[redacted]d who presents to Maternity Admissions reporting fall onto the stair on her abdomen last night around 11pm. Pt states her left abdomen hit the floor but did not hit her head or LOC. Since then she noticed vaginal spotting, decrease baby movement, HA, and increase pressure with walking.  She took 1000 mg Tylenol  around 8 am this morning but still feel abdominal cramping. Denies dizziness, weakness, SOB, palpitation, or vaginal discharge,   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-has resolved following surgery   Eczema    History of depression 11/18/2017   History of diabetes mellitus 04/11/2022   Maternal varicella, non-immune 05/09/2017   Missed abortion 04/07/2019   Vitamin B 12 deficiency 10/26/2017   05/09/22: b12 injection     Vitamin D  deficiency 05/09/2022   OB History  Gravida Para Term Preterm AB Living  6 2 2  3 2   SAB IAB Ectopic Multiple Live Births  2 1  0 2    # Outcome Date GA Lbr Len/2nd Weight Sex Type Anes PTL Lv  6 Current           5 IAB 02/2023          4 Term 10/25/22 109w0d 03:29 / 00:09 3010 g M Vag-Spont EPI  LIV  3 SAB 04/10/19 [redacted]w[redacted]d         2 Term 11/08/17 [redacted]w[redacted]d 21:32 / 00:51 3060 g F Vag-Spont EPI N LIV  1 SAB 10/07/16 [redacted]w[redacted]d          Past Surgical History:  Procedure Laterality Date   CHOLECYSTECTOMY N/A 03/13/2023   Procedure: LAPAROSCOPIC CHOLECYSTECTOMY WITH ICG DYE;  Surgeon: [name], MD;  Location: [facility];  Service: General;  Laterality: N/A;   DILATION AND EVACUATION N/A 02/24/2023   Procedure: DILATATION AND EVACUATION;  Surgeon: [name], MD;  Location: [facility] OR;  Service: Gynecology;  Laterality:  N/A;   SLEEVE GASTROPLASTY  10/12/2021   UPPER GASTROINTESTINAL ENDOSCOPY N/A    Social History   Socioeconomic History   Marital status: Single    Spouse name: Not on file   Number of children: 1   Years of education: 13   Highest education level: Not on file  Occupational History   Occupation: student   Occupation: CNA  Tobacco Use   Smoking status: Never   Smokeless tobacco: Never  Vaping Use   Vaping status: Never Used  Substance and Sexual Activity   Alcohol use: No   Drug use: No   Sexual activity: Yes    Partners: Male    Birth control/protection: None    Comment: last SI 08/18/23  Other Topics Concern   Not on file  Social History Narrative   Not on file   Social Drivers of Health   Financial Resource Strain: Low Risk  (07/29/2023)   Overall Financial Resource Strain (CARDIA)    Difficulty of Paying Living Expenses: Not very hard  Food Insecurity: No Food Insecurity (12/02/2023)   Hunger Vital Sign    Worried About Running Out of Food in the Last Year: Never true    Ran Out of Food in the Last Year:  Never true  Transportation Needs: No Transportation Needs (12/02/2023)   PRAPARE - Administrator, Civil Service (Medical): No    Lack of Transportation (Non-Medical): No  Physical Activity: Inactive (07/29/2023)   Exercise Vital Sign    Days of Exercise per Week: 0 days    Minutes of Exercise per Session: 0 min  Stress: Stress Concern Present (07/29/2023)   [employer] of Occupational Health - Occupational Stress Questionnaire    Feeling of Stress : To some extent  Social Connections: Unknown (10/06/2023)   Social Connection and Isolation Panel    Frequency of Communication with Friends and Family: Twice a week    Frequency of Social Gatherings with Friends and Family: Once a week    Attends Religious Services: Never    Database administrator or Organizations: No    Attends Banker Meetings: Never    Marital Status: Patient  declined  Catering manager Violence: Not At Risk (12/02/2023)   Humiliation, Afraid, Rape, and Kick questionnaire    Fear of Current or Ex-Partner: No    Emotionally Abused: No    Physically Abused: No    Sexually Abused: No   Family History  Problem Relation Age of Onset   Diabetes Mother    Healthy Mother    Sickle cell trait Mother    Sickle cell trait Father    Rashes / Skin problems Father        eczema   Asthma Sister    Rashes / Skin problems Sister        eczema   Rashes / Skin problems Sister        eczema   Rashes / Skin problems Sister        eczema   Sickle cell anemia Sister    Rashes / Skin problems Sister        eczema   Autism Brother    Healthy Brother    Autism Daughter    Diabetes Maternal Grandmother    Hypertension Maternal Grandmother    Cancer Maternal Grandfather        prostate   Cancer Paternal Grandmother 69       lung   Healthy Paternal Grandfather    Ovarian cancer Neg Hx    Colon cancer Neg Hx    Breast cancer Neg Hx    No current facility-administered medications on file prior to encounter.   Current Outpatient Medications on File Prior to Encounter  Medication Sig Dispense Refill   aspirin  EC 81 MG tablet Take 1 tablet (81 mg total) by mouth daily. Swallow whole. 100 tablet 12   potassium chloride  (KLOR-CON ) 10 MEQ tablet Take 1 tablet (10 mEq total) by mouth daily. 14 tablet 0   cyanocobalamin  (VITAMIN B12) 500 MCG tablet Take 1 tablet (500 mcg total) by mouth daily. 30 tablet 3   cyclobenzaprine  (FLEXERIL ) 10 MG tablet Take 1 tablet (10 mg total) by mouth 2 (two) times daily as needed. 20 tablet 0   Ferric Maltol  (ACCRUFER ) 30 MG CAPS Take 1 capsule (30 mg total) by mouth 2 (two) times daily at 8 am and 10 pm. 60 capsule 5   fluconazole  (DIFLUCAN ) 150 MG tablet Take 1 tablet (150 mg total) by mouth daily. 1 tablet 1   ondansetron  (ZOFRAN -ODT) 4 MG disintegrating tablet Take 1 tablet (4 mg total) by mouth every 8 (eight) hours as  needed. 60 tablet 0   prenatal vitamin w/FE, FA (NATACHEW) 29-1 MG CHEW chewable  tablet Chew 1 tablet by mouth daily at 12 noon. 30 tablet 2   Allergies  Allergen Reactions   Reglan  [Metoclopramide ] Anxiety and Other (See Comments)    Severe panic attack    I have reviewed patient's Past Medical Hx, Surgical Hx, Family Hx, Social Hx, medications and allergies.   Review of Systems  Respiratory: Negative.    Cardiovascular: Negative.   Gastrointestinal:  Positive for abdominal pain.  Genitourinary: Negative.     OBJECTIVE Patient Vitals for the past 24 hrs:  BP Temp Temp src Pulse Resp SpO2 Height Weight  12/02/23 1916 98/65 -- -- 94 -- -- -- --  12/02/23 1855 -- -- -- -- -- 100 % -- --  12/02/23 1852 -- -- -- -- -- 100 % -- --  12/02/23 1851 -- -- -- -- -- 100 % -- --  12/02/23 1845 -- -- -- -- -- 100 % -- --  12/02/23 1840 -- -- -- -- -- 100 % -- --  12/02/23 1835 -- -- -- -- -- 100 % -- --  12/02/23 1830 -- -- -- -- -- 100 % -- --  12/02/23 1810 -- -- -- -- -- 100 % -- --  12/02/23 1800 -- -- -- -- -- 100 % -- --  12/02/23 1730 -- -- -- -- -- 100 % -- --  12/02/23 1723 112/71 98.4 F (36.9 C) Oral 100 16 100 % -- --  12/02/23 1722 -- -- -- -- -- 99 % -- --  12/02/23 1654 115/68 98.2 F (36.8 C) Oral (!) 102 17 100 % 5' 1 (1.549 m) 91.6 kg   Constitutional: Well-developed, well-nourished female in no acute distress.  Cardiovascular: normal rate & rhythm, no murmur Respiratory: normal rate and effort. Lung sounds clear throughout GI: Abd soft, non-tender, Pos BS x 4. No guarding or rebound tenderness MS: Extremities nontender, no edema, normal ROM Neurologic: Alert and oriented x 4.   FTHR : Catagorical 1 Reactive for GA Baseline : 140 Variability : Moderate Accelerations: Present  Deceration : Absent    LAB RESULTS No results found for this or any previous visit (from the past 24 hours).  IMAGING See Media for more info  MAU COURSE Orders Placed This  Encounter  Procedures   US  MFM FETAL BPP WO NON STRESS   Discharge patient   No orders of the defined types were placed in this encounter.   MDM NST US  Fetal BPP  ASSESSMENT/PLAN 1. Fall, initial encounter   2. Vaginal spotting   3. [redacted] weeks gestation of pregnancy    1. Fall, initial encounter (Primary) Fall onto the steps on left abdomen w/ decrease fetal movement.  - BPP result was no significant abnormality - Tocometer show appropriate FHR and FHT - Discharge for self care - f/u w/ OB/GYN as scheduled  2. Vaginal spotting - Continue to monitor at home  3. [redacted] weeks gestation of pregnancy    Follow-up Information     [facility] Follow up.   Specialty: Obstetrics and Gynecology Why: As scheduled for ongoing prenatal care Contact information: [address] [place]  [address] [phone]               Allergies as of 12/02/2023       Reactions   Reglan  [metoclopramide ] Anxiety, Other (See Comments)   Severe panic attack        Medication List     STOP taking these medications  fluconazole  150 MG tablet Commonly known as: Diflucan        TAKE these medications    ACCRUFeR  30 MG Caps Generic drug: Ferric Maltol  Take 1 capsule (30 mg total) by mouth 2 (two) times daily at 8 am and 10 pm.   aspirin  EC 81 MG tablet Take 1 tablet (81 mg total) by mouth daily. Swallow whole.   cyanocobalamin  500 MCG tablet Commonly known as: VITAMIN B12 Take 1 tablet (500 mcg total) by mouth daily.   cyclobenzaprine  10 MG tablet Commonly known as: FLEXERIL  Take 1 tablet (10 mg total) by mouth 2 (two) times daily as needed.   ondansetron  4 MG disintegrating tablet Commonly known as: ZOFRAN -ODT Take 1 tablet (4 mg total) by mouth every 8 (eight) hours as needed.   potassium chloride  10 MEQ tablet Commonly known as: KLOR-CON  Take 1 tablet (10 mEq total) by mouth daily.   prenatal  vitamin w/FE, FA 29-1 MG Chew chewable tablet Chew 1 tablet by mouth daily at 12 noon.         Suzen Houston NOVAK, DO PGY 1 Family Medicine Resident 12/02/2023 7:19 PM

## 2023-12-11 ENCOUNTER — Ambulatory Visit (HOSPITAL_BASED_OUTPATIENT_CLINIC_OR_DEPARTMENT_OTHER): Payer: MEDICAID | Admitting: Certified Nurse Midwife

## 2023-12-11 VITALS — BP 105/63 | HR 101 | Wt 201.8 lb

## 2023-12-11 DIAGNOSIS — O99013 Anemia complicating pregnancy, third trimester: Secondary | ICD-10-CM

## 2023-12-11 DIAGNOSIS — O99843 Bariatric surgery status complicating pregnancy, third trimester: Secondary | ICD-10-CM | POA: Diagnosis not present

## 2023-12-11 DIAGNOSIS — Z23 Encounter for immunization: Secondary | ICD-10-CM | POA: Insufficient documentation

## 2023-12-11 DIAGNOSIS — D519 Vitamin B12 deficiency anemia, unspecified: Secondary | ICD-10-CM

## 2023-12-11 DIAGNOSIS — D509 Iron deficiency anemia, unspecified: Secondary | ICD-10-CM

## 2023-12-11 DIAGNOSIS — O26893 Other specified pregnancy related conditions, third trimester: Secondary | ICD-10-CM | POA: Diagnosis not present

## 2023-12-11 DIAGNOSIS — Z3480 Encounter for supervision of other normal pregnancy, unspecified trimester: Secondary | ICD-10-CM

## 2023-12-11 DIAGNOSIS — Z9884 Bariatric surgery status: Secondary | ICD-10-CM

## 2023-12-11 DIAGNOSIS — R55 Syncope and collapse: Secondary | ICD-10-CM

## 2023-12-11 DIAGNOSIS — Z3A30 30 weeks gestation of pregnancy: Secondary | ICD-10-CM

## 2023-12-11 DIAGNOSIS — Z2911 Encounter for prophylactic immunotherapy for respiratory syncytial virus (RSV): Secondary | ICD-10-CM | POA: Insufficient documentation

## 2023-12-11 MED ORDER — ACCRUFER 30 MG PO CAPS: 1.0000 | ORAL_CAPSULE | Freq: Every day | ORAL | 12 refills | Status: DC

## 2023-12-11 NOTE — Progress Notes (Signed)
 -US  (11/28/23): Vtx, anterior fundal placenta, AFI wnl, EFW 2lb 12oz (51%), HC 47%, AC 66%. Anatomy complete. Pregnancy complicated by elevated BMI, prior bariatric surgery, short interval pregnancy. Follow w/ another Growth US  in 6 weeks (34 weeks).     PRENATAL VISIT NOTE  Subjective:  Miranda Curtis is a 23 y.o. [phone] at [redacted]w[redacted]d being seen today for ongoing prenatal care.  She is currently monitored for the following issues for this  pregnancy and has History of gastric bypass; Supervision of normal pregnancy; Anxiety and depression; ADHD; Short interval between pregnancies affecting pregnancy, antepartum; Maternal varicella, non-immune; Obesity affecting pregnancy, antepartum; Iron  deficiency anemia; Recurrent syncope; B12 deficiency; History of bariatric surgery; COVID-19 affecting pregnancy in second trimester; Need for Tdap vaccination; Need for RSV vaccination; and Influenza Vaccine UTD (12/10/23) on their problem list.  Patient reports no bleeding, no contractions, no leaking, and occasional contractions.  Contractions: Irregular. Vag. Bleeding: Other (Spotting off and on).  Movement: Present. Denies leaking of fluid.   The following portions of the patient's history were reviewed and updated as appropriate: allergies, current medications, past family history, past medical history, past social history, past surgical history and problem list.   Objective:    Vitals:   12/11/23 0920  BP: 105/63  Pulse: (!) 101  Weight: 201 lb 12.8 oz (91.5 kg)    Fetal Status:  Fetal Heart Rate (bpm): 150 Fundal Height: 30 cm Movement: Present Presentation: Vertex  General: Alert, oriented and cooperative. Patient is in no acute distress.  Skin: Skin is warm and dry. No rash noted.   Cardiovascular: Normal heart rate noted  Respiratory: Normal respiratory effort, no problems with respiration noted  Abdomen: Soft, gravid, appropriate for gestational age.  Pain/Pressure: Present     Pelvic: Cervical  exam deferred        Extremities: Normal range of motion.  Edema: None  Mental Status: Normal mood and affect. Normal behavior. Normal judgment and thought content.   Assessment and Plan:  Pregnancy: [id number] at [redacted]w[redacted]d 1. Supervision of other normal pregnancy, antepartum (Primary)  -US  (11/28/23): Vtx, anterior fundal placenta, AFI wnl, EFW 2lb 12oz (51%), HC 47%, AC 66%. Anatomy complete. Pregnancy complicated by elevated BMI, prior bariatric surgery, short interval pregnancy. Follow w/ another Growth US  in 6 weeks (34 weeks). Plan IOL 39wk - Pt has had some issues with Syncope and has appointment with Cardiology on 12/13/23 - Discussed availability Tdap (today) and RSV Vaccine at 32-36 weeks - Pt declines Tdap today. Had Flu Vaccine yesterday. - B12 - Comp Met (CMET) - VITAMIN D  25 Hydroxy (Vit-D Deficiency, Fractures) - CBC - Folate  2. Issues with Syncope - Pt has had some issues with Syncope and has appointment with Cardiology on 12/13/23  3. History of bariatric surgery - Accrufer  once a day - B12 - Comp Met (CMET) - VITAMIN D  25 Hydroxy (Vit-D Deficiency, Fractures) - CBC - Folate  4. History of gastric bypass - B12 - Comp Met (CMET) - VITAMIN D  25 Hydroxy (Vit-D Deficiency, Fractures) - CBC - Folate  5. Iron  deficiency anemia, unspecified iron  deficiency anemia type - Pt understands recommendation for Accrufer  iron  supplement daily until delivery - B12 - Comp Met (CMET) - VITAMIN D  25 Hydroxy (Vit-D Deficiency, Fractures) - CBC - Folate - Ferric Maltol  (ACCRUFER ) 30 MG CAPS; Take 1 capsule (30 mg total) by mouth daily.  Dispense: 30 capsule; Refill: 12  6. Need for Tdap vaccination - Pt will consider Tdap at 32 weeks  7. Need  for RSV vaccination - Offer at 32 weeks  8. Influenza Vaccine UTD (12/10/23)  Preterm labor symptoms and general obstetric precautions including but not limited to vaginal bleeding, contractions, leaking of fluid and fetal movement were  reviewed in detail with the patient. Please refer to After Visit Summary for other counseling recommendations.    Future Appointments  Date Time Provider Department Center  12/13/2023  3:40 PM Sheena Pugh, DO [facility] None  12/25/2023  8:55 AM Jaydn Moscato, Arland POUR, CNM [facility] [id number]  01/08/2024  9:15 AM Cleotilde Ronal RAMAN, MD [facility] [address]  01/09/2024  8:15 AM [facility] PROVIDER 1 [facility]  01/09/2024  8:30 AM [facility] US2 [facility]  01/22/2024  9:35 AM Darin Arndt, Arland POUR, CNM [facility] [id number]  01/29/2024  9:15 AM Cleotilde Ronal RAMAN, MD [facility] [id number]  01/29/2024 10:45 AM Gregg Lek, MD [facility] None  02/05/2024  9:15 AM Tad, Arland POUR, CNM [facility] [id number]  02/12/2024  9:15 AM Cleotilde Ronal RAMAN, MD [facility] [id number]  02/19/2024  9:15 AM Melissaann Dizdarevic, Arland POUR, CNM [facility] [phone]    Arland POUR Tad, CNM

## 2023-12-12 LAB — COMPREHENSIVE METABOLIC PANEL WITH GFR
ALT: 10 IU/L (ref 0–32)
AST: 13 IU/L (ref 0–40)
Albumin: 3.7 g/dL — ABNORMAL LOW (ref 4.0–5.0)
Alkaline Phosphatase: 58 IU/L (ref 44–121)
BUN/Creatinine Ratio: 9 (ref 9–23)
BUN: 6 mg/dL (ref 6–20)
Bilirubin Total: 0.6 mg/dL (ref 0.0–1.2)
CO2: 21 mmol/L (ref 20–29)
Calcium: 9.2 mg/dL (ref 8.7–10.2)
Chloride: 101 mmol/L (ref 96–106)
Creatinine, Ser: 0.7 mg/dL (ref 0.57–1.00)
Globulin, Total: 2.7 g/dL (ref 1.5–4.5)
Glucose: 70 mg/dL (ref 70–99)
Potassium: 4.1 mmol/L (ref 3.5–5.2)
Sodium: 136 mmol/L (ref 134–144)
Total Protein: 6.4 g/dL (ref 6.0–8.5)
eGFR: 124 mL/min/1.73 (ref >59)

## 2023-12-12 LAB — CBC
Hematocrit: 29.7 % — ABNORMAL LOW (ref 34.0–46.6)
Hemoglobin: 9.1 g/dL — ABNORMAL LOW (ref 11.1–15.9)
MCH: 21.4 pg — ABNORMAL LOW (ref 26.6–33.0)
MCHC: 30.6 g/dL — ABNORMAL LOW (ref 31.5–35.7)
MCV: 70 fL — ABNORMAL LOW (ref 79–97)
Platelets: 251 x10E3/uL (ref 150–450)
RBC: 4.26 x10E6/uL (ref 3.77–5.28)
RDW: 24.5 % — ABNORMAL HIGH (ref 11.7–15.4)
WBC: 6.7 x10E3/uL (ref 3.4–10.8)

## 2023-12-12 LAB — VITAMIN D 25 HYDROXY (VIT D DEFICIENCY, FRACTURES): Vit D, 25-Hydroxy: 6.5 ng/mL — ABNORMAL LOW (ref 30.0–100.0)

## 2023-12-12 LAB — VITAMIN B12: Vitamin B-12: 164 pg/mL — ABNORMAL LOW (ref 232–1245)

## 2023-12-12 LAB — FOLATE: Folate: 5 ng/mL (ref >3.0)

## 2023-12-13 ENCOUNTER — Other Ambulatory Visit (HOSPITAL_BASED_OUTPATIENT_CLINIC_OR_DEPARTMENT_OTHER): Payer: Self-pay | Admitting: Certified Nurse Midwife

## 2023-12-13 ENCOUNTER — Ambulatory Visit (HOSPITAL_BASED_OUTPATIENT_CLINIC_OR_DEPARTMENT_OTHER): Payer: Self-pay | Admitting: Certified Nurse Midwife

## 2023-12-13 ENCOUNTER — Ambulatory Visit: Payer: MEDICAID | Admitting: Cardiology

## 2023-12-13 DIAGNOSIS — K909 Intestinal malabsorption, unspecified: Secondary | ICD-10-CM | POA: Insufficient documentation

## 2023-12-13 DIAGNOSIS — F909 Attention-deficit hyperactivity disorder, unspecified type: Secondary | ICD-10-CM

## 2023-12-13 DIAGNOSIS — D509 Iron deficiency anemia, unspecified: Secondary | ICD-10-CM

## 2023-12-13 DIAGNOSIS — F419 Anxiety disorder, unspecified: Secondary | ICD-10-CM

## 2023-12-13 DIAGNOSIS — Z9884 Bariatric surgery status: Secondary | ICD-10-CM

## 2023-12-13 DIAGNOSIS — K9089 Other intestinal malabsorption: Secondary | ICD-10-CM

## 2023-12-13 DIAGNOSIS — Z3483 Encounter for supervision of other normal pregnancy, third trimester: Secondary | ICD-10-CM

## 2023-12-13 DIAGNOSIS — R7989 Other specified abnormal findings of blood chemistry: Secondary | ICD-10-CM | POA: Insufficient documentation

## 2023-12-13 MED ORDER — CYANOCOBALAMIN 1000 MCG/ML IJ SOLN: 1000.0000 ug | INTRAMUSCULAR | 0 refills | Status: DC

## 2023-12-20 ENCOUNTER — Telehealth (HOSPITAL_BASED_OUTPATIENT_CLINIC_OR_DEPARTMENT_OTHER): Payer: Self-pay

## 2023-12-20 ENCOUNTER — Encounter (HOSPITAL_BASED_OUTPATIENT_CLINIC_OR_DEPARTMENT_OTHER): Payer: Self-pay | Admitting: Certified Nurse Midwife

## 2023-12-20 NOTE — Telephone Encounter (Signed)
 Spoke with patient. Patient reports that last Thursday she began to have intermittent pain that began to increase on Sunday. Was seen at [facility] ER for evaluation Sunday for fainting and pain. Reports she was discharged home in stable condition. Pain has continued. States that pain today has been a 7/10. Has not been timing. Took extra strength tylenol  at 8 am without relief. Reports she is on her hands and knees with a blanket under her stomach to relieve pain. Denies vaginal bleeding or leakage of fluid. Advised with continued unrelieved pain it is recommended that she be seen at MAU for further evaluation. Patient does not desire to seek care at MAU. Advised with the ongoing pain without relief with Tylenol  she needs to be seen. Patient is agreeable and does have someone who can drive her. Advised I will notify Arland of symptoms she is experiencing and if she has any additional or different recommendations I will return call. If not she will not to proceed to MAU.

## 2023-12-25 ENCOUNTER — Other Ambulatory Visit (HOSPITAL_BASED_OUTPATIENT_CLINIC_OR_DEPARTMENT_OTHER): Payer: Self-pay

## 2023-12-25 ENCOUNTER — Telehealth (HOSPITAL_BASED_OUTPATIENT_CLINIC_OR_DEPARTMENT_OTHER): Payer: Self-pay

## 2023-12-25 ENCOUNTER — Other Ambulatory Visit: Payer: Self-pay

## 2023-12-25 ENCOUNTER — Inpatient Hospital Stay (HOSPITAL_COMMUNITY)
Admission: AD | Admit: 2023-12-25 | Discharge: 2023-12-25 | Disposition: A | Discharge status: Home / Self Care | Payer: MEDICAID | Attending: Obstetrics and Gynecology | Admitting: Obstetrics and Gynecology

## 2023-12-25 ENCOUNTER — Ambulatory Visit (INDEPENDENT_AMBULATORY_CARE_PROVIDER_SITE_OTHER): Payer: MEDICAID | Admitting: Certified Nurse Midwife

## 2023-12-25 ENCOUNTER — Encounter (HOSPITAL_COMMUNITY): Payer: Self-pay | Admitting: Obstetrics & Gynecology

## 2023-12-25 ENCOUNTER — Encounter (HOSPITAL_BASED_OUTPATIENT_CLINIC_OR_DEPARTMENT_OTHER): Payer: Self-pay | Admitting: Certified Nurse Midwife

## 2023-12-25 VITALS — BP 104/56 | HR 96 | Wt 202.8 lb

## 2023-12-25 DIAGNOSIS — O98313 Other infections with a predominantly sexual mode of transmission complicating pregnancy, third trimester: Secondary | ICD-10-CM | POA: Diagnosis not present

## 2023-12-25 DIAGNOSIS — A5901 Trichomonal vulvovaginitis: Secondary | ICD-10-CM

## 2023-12-25 DIAGNOSIS — Z113 Encounter for screening for infections with a predominantly sexual mode of transmission: Secondary | ICD-10-CM | POA: Diagnosis present

## 2023-12-25 DIAGNOSIS — Z3483 Encounter for supervision of other normal pregnancy, third trimester: Secondary | ICD-10-CM

## 2023-12-25 DIAGNOSIS — O99843 Bariatric surgery status complicating pregnancy, third trimester: Secondary | ICD-10-CM | POA: Insufficient documentation

## 2023-12-25 DIAGNOSIS — O26893 Other specified pregnancy related conditions, third trimester: Secondary | ICD-10-CM

## 2023-12-25 DIAGNOSIS — O99213 Obesity complicating pregnancy, third trimester: Secondary | ICD-10-CM | POA: Insufficient documentation

## 2023-12-25 DIAGNOSIS — O09899 Supervision of other high risk pregnancies, unspecified trimester: Secondary | ICD-10-CM

## 2023-12-25 DIAGNOSIS — Z3A32 32 weeks gestation of pregnancy: Secondary | ICD-10-CM

## 2023-12-25 DIAGNOSIS — D509 Iron deficiency anemia, unspecified: Secondary | ICD-10-CM | POA: Insufficient documentation

## 2023-12-25 DIAGNOSIS — R1030 Lower abdominal pain, unspecified: Secondary | ICD-10-CM | POA: Diagnosis present

## 2023-12-25 DIAGNOSIS — R55 Syncope and collapse: Secondary | ICD-10-CM

## 2023-12-25 DIAGNOSIS — Z3689 Encounter for other specified antenatal screening: Secondary | ICD-10-CM

## 2023-12-25 DIAGNOSIS — O99013 Anemia complicating pregnancy, third trimester: Secondary | ICD-10-CM | POA: Diagnosis not present

## 2023-12-25 DIAGNOSIS — Z2839 Other underimmunization status: Secondary | ICD-10-CM

## 2023-12-25 DIAGNOSIS — O99210 Obesity complicating pregnancy, unspecified trimester: Secondary | ICD-10-CM

## 2023-12-25 DIAGNOSIS — Z2911 Encounter for prophylactic immunotherapy for respiratory syncytial virus (RSV): Secondary | ICD-10-CM

## 2023-12-25 DIAGNOSIS — Z23 Encounter for immunization: Secondary | ICD-10-CM

## 2023-12-25 DIAGNOSIS — O209 Hemorrhage in early pregnancy, unspecified: Secondary | ICD-10-CM | POA: Diagnosis present

## 2023-12-25 LAB — URINALYSIS, ROUTINE W REFLEX MICROSCOPIC
Bilirubin Urine: NEGATIVE
Glucose, UA: NEGATIVE mg/dL
Hgb urine dipstick: NEGATIVE
Ketones, ur: NEGATIVE mg/dL
Nitrite: NEGATIVE
Protein, ur: NEGATIVE mg/dL
Specific Gravity, Urine: 1.016 (ref 1.005–1.030)
pH: 6 (ref 5.0–8.0)

## 2023-12-25 LAB — WET PREP, GENITAL
Clue Cells Wet Prep HPF POC: NONE SEEN
Sperm: NONE SEEN
WBC, Wet Prep HPF POC: >=10 — AB (ref <10)
Yeast Wet Prep HPF POC: NONE SEEN

## 2023-12-25 MED ORDER — METRONIDAZOLE 500 MG PO TABS
500.0000 mg | ORAL_TABLET | Freq: Two times a day (BID) | ORAL | 0 refills | Status: DC
Start: 2023-12-25 — End: 2024-01-21

## 2023-12-25 MED ORDER — ACCRUFER 30 MG PO CAPS
30.0000 mg | ORAL_CAPSULE | Freq: Every day | ORAL | 2 refills | Status: DC
  Filled 2023-12-25: qty 30, 30d supply, fill #0

## 2023-12-25 MED ORDER — ONDANSETRON HCL 8 MG PO TABS
8.0000 mg | ORAL_TABLET | Freq: Three times a day (TID) | ORAL | 2 refills | Status: DC | PRN
  Filled 2023-12-25: qty 20, 7d supply, fill #0

## 2023-12-25 NOTE — MAU Note (Signed)
 Miranda Curtis is a 23 y.o. at [redacted]w[redacted]d here in MAU reporting: VB today along with cramping. Was seen in the office today but went home and took a nap - when she woke up, she used the restroom and noticed bright red spotting when she wiped. States she sent a message and was told to come here for evaluation. Denies recent intercourse or anything in the vagina. +FM   LMP: NA Onset of complaint: 1500 Pain score: 6 Vitals:   12/25/23 1916  BP: 107/65  Pulse: (!) 105  Resp: 17  Temp: 98.2 F (36.8 C)  SpO2: 100%     FHT: 155  Lab orders placed from triage: UA

## 2023-12-25 NOTE — Telephone Encounter (Signed)
 Spoke with patient. Patient reports that since she was seen today in the office with Arland Roller, CNM she has been having intermittent cramping that is a 6.5/10. States that she took a nap and went to the restroom and noticed light bright red bleeding. Has not taken tylenol  as she reports this has not relieved her cramping before. Has not had vaginal intercourse. Advised she will need to be seen at MAU for further evaluation. Patient is agreeable and verbalizes understanding.

## 2023-12-25 NOTE — MAU Provider Note (Signed)
 Chief Complaint:  Abdominal Pain and Vaginal Bleeding   HPI    Miranda Curtis is a 23 y.o. [phone] at [redacted]w[redacted]d who presents to maternity admissions reporting she was sent into the the MAU to be evaluated after calling the office with complaints of going to the bathroom and when wiping she noticed some light red bleeding after wiping.  Patient states she also was having some lower abdominal cramping which has not been resolved by Tylenol .  She denies any vaginal intercourse, cervical exams and offers no complaints of dysuria ,vaginal burning, itching,  or irritation.   Her prenatal course has been complicated by issues with syncope, obesity, history of bariatric surgery, history of a gastric bypass, iron  deficiency anemia  Pregnancy Course: [facility]  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-has resolved following surgery   Eczema    History of depression 11/18/2017   History of diabetes mellitus 04/11/2022   Maternal varicella, non-immune 05/09/2017   Missed abortion 04/07/2019   Vitamin B 12 deficiency 10/26/2017   05/09/22: b12 injection     Vitamin D  deficiency 05/09/2022   OB History  Gravida Para Term Preterm AB Living  6 2 2  3 2   SAB IAB Ectopic Multiple Live Births  2 1  0 2    # Outcome Date GA Lbr Len/2nd Weight Sex Type Anes PTL Lv  6 Current           5 IAB 02/2023          4 Term 10/25/22 [redacted]w[redacted]d 03:29 / 00:09 3010 g M Vag-Spont EPI  LIV  3 SAB 04/10/19 [redacted]w[redacted]d         2 Term 11/08/17 [redacted]w[redacted]d 21:32 / 00:51 3060 g F Vag-Spont EPI N LIV  1 SAB 10/07/16 [redacted]w[redacted]d          Past Surgical History:  Procedure Laterality Date   CHOLECYSTECTOMY N/A 03/13/2023   Procedure: LAPAROSCOPIC CHOLECYSTECTOMY WITH ICG DYE;  Surgeon: [name], MD;  Location: [facility];  Service: General;  Laterality: N/A;    DILATION AND EVACUATION N/A 02/24/2023   Procedure: DILATATION AND EVACUATION;  Surgeon: [name], MD;  Location[facility] OR;  Service: Gynecology;  Laterality: N/A;   SLEEVE GASTROPLASTY  10/12/2021   UPPER GASTROINTESTINAL ENDOSCOPY N/A    Family History  Problem Relation Age of Onset   Diabetes Mother    Healthy Mother    Sickle cell trait Mother    Sickle cell trait Father    Rashes / Skin problems Father        eczema   Asthma Sister    Rashes / Skin problems Sister        eczema   Rashes / Skin problems Sister        eczema   Rashes / Skin problems Sister        eczema   Sickle cell anemia Sister    Rashes / Skin problems Sister        eczema   Autism Brother    Healthy Brother    Autism Daughter    Diabetes Maternal Grandmother    Hypertension Maternal Grandmother    Cancer Maternal Grandfather        prostate   Cancer Paternal Grandmother 21       lung  Healthy Paternal Grandfather    Ovarian cancer Neg Hx    Colon cancer Neg Hx    Breast cancer Neg Hx    Social History   Tobacco Use   Smoking status: Never   Smokeless tobacco: Never  Vaping Use   Vaping status: Never Used  Substance Use Topics   Alcohol use: No   Drug use: No   Allergies  Allergen Reactions   Reglan  [Metoclopramide ] Anxiety and Other (See Comments)    Severe panic attack   Medications Prior to Admission  Medication Sig Dispense Refill Last Dose/Taking   aspirin  EC 81 MG tablet Take 1 tablet (81 mg total) by mouth daily. Swallow whole. (Patient not taking: Reported on 12/25/2023) 100 tablet 12    cyanocobalamin  (VITAMIN B12) 1000 MCG/ML injection Inject 1 mL (1,000 mcg total) into the muscle every other day for 30 doses. (Patient not taking: Reported on 12/25/2023) 30 mL 0    cyanocobalamin  (VITAMIN B12) 500 MCG tablet Take 1 tablet (500 mcg total) by mouth daily. (Patient not taking: Reported on 12/25/2023) 30 tablet 3    cyclobenzaprine  (FLEXERIL ) 10 MG tablet Take 1 tablet (10 mg  total) by mouth 2 (two) times daily as needed. (Patient not taking: Reported on 12/25/2023) 20 tablet 0    Ferric Maltol  (ACCRUFER ) 30 MG CAPS Take 1 capsule (30 mg total) by mouth 2 (two) times daily at 8 am and 10 pm. (Patient not taking: Reported on 12/25/2023) 60 capsule 5    Ferric Maltol  (ACCRUFER ) 30 MG CAPS Take 1 capsule (30 mg total) by mouth daily. (Patient not taking: Reported on 12/25/2023) 30 capsule 12    Ferric Maltol  (ACCRUFER ) 30 MG CAPS Take 1 capsule (30 mg total) by mouth daily. 30 capsule 2    ondansetron  (ZOFRAN ) 8 MG tablet Take 1 tablet (8 mg total) by mouth every 8 (eight) hours as needed for nausea or vomiting. 20 tablet 2    ondansetron  (ZOFRAN -ODT) 4 MG disintegrating tablet Take 1 tablet (4 mg total) by mouth every 8 (eight) hours as needed. (Patient not taking: Reported on 12/25/2023) 60 tablet 0    potassium chloride  (KLOR-CON ) 10 MEQ tablet Take 1 tablet (10 mEq total) by mouth daily. (Patient not taking: Reported on 12/25/2023) 14 tablet 0    prenatal vitamin w/FE, FA (NATACHEW) 29-1 MG CHEW chewable tablet Chew 1 tablet by mouth daily at 12 noon. (Patient not taking: Reported on 12/25/2023) 30 tablet 2     I have reviewed patient's Past Medical Hx, Surgical Hx, Family Hx, Social Hx, medications and allergies.   ROS  Pertinent items noted in HPI and remainder of comprehensive ROS otherwise negative.   PHYSICAL EXAM  Patient Vitals for the past 24 hrs:  BP Temp Temp src Pulse Resp SpO2 Height Weight  12/25/23 1936 124/78 -- -- (!) 106 -- -- -- --  12/25/23 1916 107/65 98.2 F (36.8 C) Oral (!) 105 17 100 % 5' 1 (1.549 m) 92.8 kg    Constitutional: Well-developed, well-nourished obese female in no acute distress.  Cardiovascular: normal rate & rhythm, warm and well-perfused Respiratory: normal effort, no problems with respiration noted GI: Abd soft, non-tender, gravid, no pain reproducible on palpation MS: Extremities nontender, no edema, normal ROM Neurologic:  Alert and oriented x 4.  GU: no CVA tenderness Pelvic: Exam chaperoned by Camie Punter RN External perineum appers erythremic and slightly edemetous with no visualized lesions Speculum: No pooling, no evidence of vaginal bleeding, cervix visually closed  Dilation: Closed Exam by:: L. Taziyah Iannuzzi, NP  Fetal Tracing: Cat 1 reactive Baseline: 140 Variability: Moderate  Accelerations: present Decelerations: absent Toco: No ctx's   Labs: Results for orders placed or performed during the hospital encounter of 12/25/23 (from the past 24 hours)  Urinalysis, Routine w reflex microscopic -Urine, Clean Catch     Status: Abnormal   Collection Time: 12/25/23  7:20 PM  Result Value Ref Range   Color, Urine YELLOW YELLOW   APPearance HAZY (A) CLEAR   Specific Gravity, Urine 1.016 1.005 - 1.030   pH 6.0 5.0 - 8.0   Glucose, UA NEGATIVE NEGATIVE mg/dL   Hgb urine dipstick NEGATIVE NEGATIVE   Bilirubin Urine NEGATIVE NEGATIVE   Ketones, ur NEGATIVE NEGATIVE mg/dL   Protein, ur NEGATIVE NEGATIVE mg/dL   Nitrite NEGATIVE NEGATIVE   Leukocytes,Ua MODERATE (A) NEGATIVE   RBC / HPF 0-5 0 - 5 RBC/hpf   WBC, UA 6-10 0 - 5 WBC/hpf   Bacteria, UA FEW (A) NONE SEEN   Squamous Epithelial / HPF 6-10 0 - 5 /HPF   Mucus PRESENT    Trichomonas, UA PRESENT (A) NONE SEEN  Wet prep, genital     Status: Abnormal   Collection Time: 12/25/23  7:43 PM  Result Value Ref Range   Yeast Wet Prep HPF POC NONE SEEN NONE SEEN   Trich, Wet Prep PRESENT (A) NONE SEEN   Clue Cells Wet Prep HPF POC NONE SEEN NONE SEEN   WBC, Wet Prep HPF POC >=10 (A) <10   Sperm NONE SEEN     Imaging:  No results found.  MDM & MAU COURSE  MDM:  HIGH  Vaginal spotting and abdominal cramping   - Wet prep C/W Trichomonas ( Plan for RX at discharge with S/R/B and information regarding partner treatment) - GC pending at discharge - NST for GA and fetal reassurance ( Cat 1 reactive)  Will plan for discharge at this time  MAU  Course: Orders Placed This Encounter  Procedures   Wet prep, genital   Urinalysis, Routine w reflex microscopic -Urine, Clean Catch   Discharge patient Discharge disposition: 01-Home or Self Care; Discharge patient date: 12/25/2023   Meds ordered this encounter  Medications   metroNIDAZOLE  (FLAGYL ) 500 MG tablet    Sig: Take 1 tablet (500 mg total) by mouth 2 (two) times daily.    Dispense:  14 tablet    Refill:  0    Supervising Provider:   PRATT, TANYA S [[id number]]      I have reviewed the patient chart and performed the physical exam . I have ordered & interpreted the lab results and reviewed and interpreted the NST Medications ordered as stated below.  A/P as described below.  Counseling and education provided and patient agreeable  with plan as described below. Verbalized understanding.    ASSESSMENT   1. Trichomonas vaginitis   2. [redacted] weeks gestation of pregnancy   3. NST (non-stress test) reactive on fetal surveillance     PLAN  Discharge home in stable condition with return precautions.   See AVS for full description of information given to the patient including both verbal and written. Patient verbalized understanding and agrees with the plan as described above.     Follow-up Information     [facility] Follow up.   Specialty: Obstetrics and Gynecology Why: If symptoms worsen or fail to resolve, As scheduled for ongoing prenatal care Contact information: [phone]  [address] [place]  [phone]                Allergies as of 12/25/2023       Reactions   Reglan  [metoclopramide ] Anxiety, Other (See Comments)   Severe panic attack        Medication List     TAKE these medications    ACCRUFeR  30 MG Caps Generic drug: Ferric Maltol  Take 1 capsule (30 mg total) by mouth 2 (two) times daily at 8 am and 10 pm.   ACCRUFeR  30 MG Caps Generic drug: Ferric Maltol  Take 1 capsule  (30 mg total) by mouth daily.   ACCRUFeR  30 MG Caps Generic drug: Ferric Maltol  Take 1 capsule (30 mg total) by mouth daily.   aspirin  EC 81 MG tablet Take 1 tablet (81 mg total) by mouth daily. Swallow whole.   cyanocobalamin  500 MCG tablet Commonly known as: VITAMIN B12 Take 1 tablet (500 mcg total) by mouth daily.   cyanocobalamin  1000 MCG/ML injection Commonly known as: VITAMIN B12 Inject 1 mL (1,000 mcg total) into the muscle every other day for 30 doses.   cyclobenzaprine  10 MG tablet Commonly known as: FLEXERIL  Take 1 tablet (10 mg total) by mouth 2 (two) times daily as needed.   metroNIDAZOLE  500 MG tablet Commonly known as: FLAGYL  Take 1 tablet (500 mg total) by mouth 2 (two) times daily.   ondansetron  4 MG disintegrating tablet Commonly known as: ZOFRAN -ODT Take 1 tablet (4 mg total) by mouth every 8 (eight) hours as needed.   ondansetron  8 MG tablet Commonly known as: ZOFRAN  Take 1 tablet (8 mg total) by mouth every 8 (eight) hours as needed for nausea or vomiting.   potassium chloride  10 MEQ tablet Commonly known as: KLOR-CON  Take 1 tablet (10 mEq total) by mouth daily.   prenatal vitamin w/FE, FA 29-1 MG Chew chewable tablet Chew 1 tablet by mouth daily at 12 noon.        Olam Dalton, MSN, [facility], Center for [facility]    This chart was dictated using voice recognition software, Dragon. Despite the best efforts of this provider to proofread and correct errors, errors may still occur which can change documentation meaning.

## 2023-12-26 ENCOUNTER — Other Ambulatory Visit (HOSPITAL_BASED_OUTPATIENT_CLINIC_OR_DEPARTMENT_OTHER): Payer: Self-pay | Admitting: Certified Nurse Midwife

## 2023-12-26 DIAGNOSIS — A5901 Trichomonal vulvovaginitis: Secondary | ICD-10-CM | POA: Insufficient documentation

## 2023-12-26 LAB — COMPREHENSIVE METABOLIC PANEL WITH GFR
ALT: 7 IU/L (ref 0–32)
AST: 9 IU/L (ref 0–40)
Albumin: 3.7 g/dL — ABNORMAL LOW (ref 4.0–5.0)
Alkaline Phosphatase: 61 IU/L (ref 41–116)
BUN/Creatinine Ratio: 6 — ABNORMAL LOW (ref 9–23)
BUN: 4 mg/dL — ABNORMAL LOW (ref 6–20)
Bilirubin Total: 0.7 mg/dL (ref 0.0–1.2)
CO2: 18 mmol/L — ABNORMAL LOW (ref 20–29)
Calcium: 8.9 mg/dL (ref 8.7–10.2)
Chloride: 104 mmol/L (ref 96–106)
Creatinine, Ser: 0.67 mg/dL (ref 0.57–1.00)
Globulin, Total: 2.8 g/dL (ref 1.5–4.5)
Glucose: 71 mg/dL (ref 70–99)
Potassium: 4 mmol/L (ref 3.5–5.2)
Sodium: 136 mmol/L (ref 134–144)
Total Protein: 6.5 g/dL (ref 6.0–8.5)
eGFR: 125 mL/min/1.73 (ref >59)

## 2023-12-26 LAB — CBC
Hematocrit: 29.8 % — ABNORMAL LOW (ref 34.0–46.6)
Hemoglobin: 9 g/dL — ABNORMAL LOW (ref 11.1–15.9)
MCH: 21.1 pg — ABNORMAL LOW (ref 26.6–33.0)
MCHC: 30.2 g/dL — ABNORMAL LOW (ref 31.5–35.7)
MCV: 70 fL — ABNORMAL LOW (ref 79–97)
Platelets: 273 x10E3/uL (ref 150–450)
RBC: 4.26 x10E6/uL (ref 3.77–5.28)
RDW: 23.4 % — ABNORMAL HIGH (ref 11.7–15.4)
WBC: 6.6 x10E3/uL (ref 3.4–10.8)

## 2023-12-26 LAB — GC/CHLAMYDIA PROBE AMP (~~LOC~~) NOT AT ARMC
Chlamydia: NEGATIVE
Comment: NEGATIVE
Comment: NORMAL
Neisseria Gonorrhea: NEGATIVE

## 2023-12-26 LAB — FERRITIN: Ferritin: 6 ng/mL — ABNORMAL LOW (ref 15–150)

## 2023-12-26 LAB — HEMOGLOBIN A1C
Est. average glucose Bld gHb Est-mCnc: 77 mg/dL
Hgb A1c MFr Bld: 4.3 % — ABNORMAL LOW (ref 4.8–5.6)

## 2023-12-26 NOTE — Progress Notes (Signed)
 PRENATAL VISIT NOTE  Subjective:  Miranda Curtis is a 23 y.o. [phone] at [redacted]w[redacted]d being seen today for ongoing prenatal care.  She is currently monitored for the following issues for this  pregnancy and has History of gastric bypass; Supervision of normal pregnancy; Anxiety and depression; ADHD; Short interval between pregnancies affecting pregnancy, antepartum; Maternal varicella, non-immune; Obesity affecting pregnancy, antepartum; Iron  deficiency anemia; Recurrent syncope; B12 deficiency; History of bariatric surgery; COVID-19 affecting pregnancy in second trimester; Need for Tdap vaccination; Need for RSV vaccination; Influenza Vaccine UTD (12/10/23); Low vitamin B12 level; and Malabsorption on their problem list.  Patient reports no bleeding and no leaking.  Contractions: Irregular. Vag. Bleeding: None.  Movement: Present. Denies leaking of fluid.   The following portions of the patient's history were reviewed and updated as appropriate: allergies, current medications, past family history, past medical history, past social history, past surgical history and problem list.   Objective:    Vitals:   12/25/23 0917  BP: (!) 104/56  Pulse: 96  Weight: 202 lb 12.8 oz (92 kg)    Fetal Status:      Movement: Present    General: Alert, oriented and cooperative. Patient is in no acute distress.  Skin: Skin is warm and dry. No rash noted.   Cardiovascular: Normal heart rate noted  Respiratory: Normal respiratory effort, no problems with respiration noted  Abdomen: Soft, gravid, appropriate for gestational age.  Pain/Pressure: Present     Pelvic: Cervical exam deferred        Extremities: Normal range of motion.  Edema: None  Mental Status: Normal mood and affect. Normal behavior. Normal judgment and thought content.   Assessment and Plan:  Pregnancy: [id number] at [redacted]w[redacted]d  1. Supervision of other normal pregnancy, antepartum (Primary)   -US  (11/28/23): Vtx, anterior fundal placenta, AFI wnl, EFW  2lb 12oz (51%), HC 47%, AC 66%. Anatomy complete. Pregnancy complicated by elevated BMI, prior bariatric surgery, short interval pregnancy. Follow w/ another Growth US  in 6 weeks (34 weeks). Plan IOL 39wk - Pt has had some issues with Syncope and has appointment with Cardiology on 12/13/23 - Discussed availability Tdap (today) and RSV Vaccine at 32-36 weeks - Pt declines Tdap today. Had Flu Vaccine yesterday. - B12 - Comp Met (CMET) - VITAMIN D  25 Hydroxy (Vit-D Deficiency, Fractures) - CBC - Folate   2. Issues with Syncope - Pt has had some issues with Syncope and has appointment with Cardiology on 12/13/23 (pt had to cancel this appt but did reschedule it)   3. History of bariatric surgery - Accrufer  once a day - B12 - Comp Met (CMET) - VITAMIN D  25 Hydroxy (Vit-D Deficiency, Fractures) - CBC - Folate   4. History of gastric bypass - B12 - Comp Met (CMET) - VITAMIN D  25 Hydroxy (Vit-D Deficiency, Fractures) - CBC - Folate   5. Iron  deficiency anemia, unspecified iron  deficiency anemia type - Pt understands recommendation for Accrufer  iron  supplement daily until delivery - B12 - Comp Met (CMET) - VITAMIN D  25 Hydroxy (Vit-D Deficiency, Fractures) - CBC - Folate - Ferric Maltol  (ACCRUFER ) 30 MG CAPS; Take 1 capsule (30 mg total) by mouth daily.  Dispense: 30 capsule; Refill: 12   6. Need for Tdap vaccination - Pt will consider Tdap at 32 weeks   7. Need for RSV vaccination - Offer at 32 weeks   8. Influenza Vaccine UTD (12/10/23)   Preterm labor symptoms and general obstetric precautions including but not limited to vaginal bleeding, contractions,  leaking of fluid and fetal movement were reviewed in detail with the patient. Please refer to After Visit Summary for other counseling recommendations.   No follow-ups on file.  Future Appointments  Date Time Provider Department Center  01/08/2024  9:15 AM Cleotilde Ronal RAMAN, MD [facility] [id number]  01/09/2024  8:15 AM [facility]  PROVIDER 1 [facility]  01/09/2024  8:30 AM [facility] US2 [facility]  01/22/2024  9:35 AM Deliliah Spranger, Arland POUR, CNM [facility] [id number] [facility]  01/22/2024 10:00 AM Sheena Pugh, DO [facility]  01/29/2024  9:15 AM Cleotilde Ronal RAMAN, MD [facility] [id number] [facility]  01/29/2024 10:45 AM Gregg Lek, MD [facility] None  02/05/2024  9:15 AM Tad, Arland POUR, CNM [facility] [id number]  02/13/2024  9:15 AM Delores Nidia CROME, FNP [facility] [phone] [facility]  02/20/2024 10:15 AM Delores Nidia CROME, FNP [facility] [phone] [facility]    Arland POUR Tad, CNM

## 2023-12-30 ENCOUNTER — Ambulatory Visit (HOSPITAL_BASED_OUTPATIENT_CLINIC_OR_DEPARTMENT_OTHER): Payer: Self-pay | Admitting: Certified Nurse Midwife

## 2023-12-30 ENCOUNTER — Other Ambulatory Visit (HOSPITAL_BASED_OUTPATIENT_CLINIC_OR_DEPARTMENT_OTHER): Payer: Self-pay | Admitting: Certified Nurse Midwife

## 2024-01-04 ENCOUNTER — Observation Stay
Admission: EM | Admit: 2024-01-04 | Discharge: 2024-01-05 | Disposition: A | Payer: MEDICAID | Attending: Obstetrics | Admitting: Obstetrics

## 2024-01-04 ENCOUNTER — Other Ambulatory Visit: Payer: Self-pay

## 2024-01-04 ENCOUNTER — Encounter: Payer: Self-pay | Admitting: Obstetrics and Gynecology

## 2024-01-04 ENCOUNTER — Observation Stay: Payer: MEDICAID

## 2024-01-04 DIAGNOSIS — Z3483 Encounter for supervision of other normal pregnancy, third trimester: Principal | ICD-10-CM

## 2024-01-04 DIAGNOSIS — O26893 Other specified pregnancy related conditions, third trimester: Principal | ICD-10-CM | POA: Insufficient documentation

## 2024-01-04 DIAGNOSIS — R109 Unspecified abdominal pain: Secondary | ICD-10-CM | POA: Insufficient documentation

## 2024-01-04 DIAGNOSIS — F32A Depression, unspecified: Secondary | ICD-10-CM

## 2024-01-04 DIAGNOSIS — Z3A33 33 weeks gestation of pregnancy: Secondary | ICD-10-CM | POA: Diagnosis not present

## 2024-01-04 DIAGNOSIS — O23593 Infection of other part of genital tract in pregnancy, third trimester: Secondary | ICD-10-CM | POA: Insufficient documentation

## 2024-01-04 DIAGNOSIS — B9689 Other specified bacterial agents as the cause of diseases classified elsewhere: Secondary | ICD-10-CM | POA: Diagnosis present

## 2024-01-04 DIAGNOSIS — W19XXXA Unspecified fall, initial encounter: Secondary | ICD-10-CM | POA: Diagnosis present

## 2024-01-04 DIAGNOSIS — N898 Other specified noninflammatory disorders of vagina: Secondary | ICD-10-CM | POA: Diagnosis not present

## 2024-01-04 DIAGNOSIS — O368130 Decreased fetal movements, third trimester, not applicable or unspecified: Secondary | ICD-10-CM | POA: Insufficient documentation

## 2024-01-04 DIAGNOSIS — F419 Anxiety disorder, unspecified: Secondary | ICD-10-CM

## 2024-01-04 DIAGNOSIS — B3731 Acute candidiasis of vulva and vagina: Secondary | ICD-10-CM | POA: Diagnosis present

## 2024-01-04 DIAGNOSIS — D509 Iron deficiency anemia, unspecified: Secondary | ICD-10-CM

## 2024-01-04 DIAGNOSIS — F909 Attention-deficit hyperactivity disorder, unspecified type: Secondary | ICD-10-CM

## 2024-01-04 MED ORDER — ACETAMINOPHEN 500 MG PO TABS
1000.0000 mg | ORAL_TABLET | Freq: Once | ORAL | Status: AC
  Administered 2024-01-05: 1000 mg via ORAL
  Filled 2024-01-04: qty 2

## 2024-01-04 NOTE — OB Triage Note (Addendum)
 Miranda Curtis 23 y.o. @[redacted]w[redacted]d  [id number] pt at [facility] presents to Labor & Delivery triage via EMS reporting a syncopal episode at 2230. She fell on the right side of her abdomen and has had decreased fetal movement since the fall. Pain 8/10 on abdomen. Pt reports that she passes out frequently, reporting her last episode on Monday. She reports that her discharge has been increased today (white and clear, thin in character) and she had some blood when she wiped this morning. Pt reports that she started taking her antibiotic today for trich because her pharmacy did not have the medicine until today. She thinks the bleeding may be from vaginal irritation She denies contractions. External FM and TOCO applied to non-tender abdomen. Initial FHR 135. Vital signs obtained and within normal limits. Pt instructed not to get out of bed without assistance. Patient oriented to care environment including call bell and bed control use. Aisha, CNM notified of patient's arrival. Plan to observe. Landen Knoedler L. Dhruvi Crenshaw, RN BSN 01/04/2024 11:14 PM

## 2024-01-04 NOTE — Progress Notes (Signed)
 US  and lab at bedside. Wet prep and G/C collected and sent to lab. Will collect U/A once US  completed. Shlome Baldree L. Gar Glance, RN BSN 01/04/2024 11:45 PM,

## 2024-01-05 DIAGNOSIS — O26893 Other specified pregnancy related conditions, third trimester: Secondary | ICD-10-CM | POA: Diagnosis not present

## 2024-01-05 DIAGNOSIS — B3731 Acute candidiasis of vulva and vagina: Secondary | ICD-10-CM | POA: Diagnosis present

## 2024-01-05 DIAGNOSIS — B9689 Other specified bacterial agents as the cause of diseases classified elsewhere: Secondary | ICD-10-CM | POA: Diagnosis present

## 2024-01-05 LAB — URINALYSIS, ROUTINE W REFLEX MICROSCOPIC
Bilirubin Urine: NEGATIVE
Glucose, UA: NEGATIVE mg/dL
Hgb urine dipstick: NEGATIVE
Ketones, ur: 5 mg/dL — AB
Nitrite: NEGATIVE
Protein, ur: 30 mg/dL — AB
Specific Gravity, Urine: 1.02 (ref 1.005–1.030)
WBC, UA: >50 WBC/hpf (ref 0–5)
pH: 6 (ref 5.0–8.0)

## 2024-01-05 LAB — CBC
HCT: 27.8 % — ABNORMAL LOW (ref 36.0–46.0)
Hemoglobin: 9.1 g/dL — ABNORMAL LOW (ref 12.0–15.0)
MCH: 21.2 pg — ABNORMAL LOW (ref 26.0–34.0)
MCHC: 32.7 g/dL (ref 30.0–36.0)
MCV: 64.8 fL — ABNORMAL LOW (ref 80.0–100.0)
Platelets: 250 K/uL (ref 150–400)
RBC: 4.29 MIL/uL (ref 3.87–5.11)
RDW: 21.6 % — ABNORMAL HIGH (ref 11.5–15.5)
WBC: 7 K/uL (ref 4.0–10.5)
nRBC: 0 % (ref 0.0–0.2)

## 2024-01-05 LAB — CHLAMYDIA/NGC RT PCR (ARMC ONLY)
Chlamydia Tr: NOT DETECTED
N gonorrhoeae: NOT DETECTED

## 2024-01-05 LAB — WET PREP, GENITAL
Sperm: NONE SEEN
WBC, Wet Prep HPF POC: >=10 — AB (ref <10)

## 2024-01-05 MED ORDER — METRONIDAZOLE 500 MG PO TABS
500.0000 mg | ORAL_TABLET | Freq: Two times a day (BID) | ORAL | Status: DC
  Administered 2024-01-05: 500 mg via ORAL

## 2024-01-05 MED ORDER — SODIUM CHLORIDE 0.9 % IV SOLN
300.0000 mg | Freq: Once | INTRAVENOUS | Status: DC
  Filled 2024-01-05: qty 15

## 2024-01-05 MED ORDER — METRONIDAZOLE 500 MG PO TABS: 500.0000 mg | ORAL_TABLET | Freq: Two times a day (BID) | ORAL | Status: DC

## 2024-01-05 MED ORDER — FLUCONAZOLE 50 MG PO TABS
150.0000 mg | ORAL_TABLET | Freq: Once | ORAL | Status: AC
  Administered 2024-01-05: 150 mg via ORAL
  Filled 2024-01-05: qty 1

## 2024-01-05 MED ORDER — LACTATED RINGERS IV BOLUS
1000.0000 mL | Freq: Once | INTRAVENOUS | Status: AC
  Administered 2024-01-05: 1000 mL via INTRAVENOUS

## 2024-01-05 MED ORDER — METRONIDAZOLE 500 MG PO TABS
ORAL_TABLET | ORAL | Status: AC
  Filled 2024-01-05: qty 1

## 2024-01-05 MED ORDER — IRON SUCROSE 300 MG IVPB - SIMPLE MED
300.0000 mg | Freq: Once | Status: AC
  Administered 2024-01-05: 300 mg via INTRAVENOUS
  Filled 2024-01-05: qty 300

## 2024-01-05 NOTE — Discharge Summary (Signed)
 Miranda Curtis is a 23 y.o. female. She is at [redacted]w[redacted]d gestation.unknown LMP, EDD of  02/19/2024, by Ultrasound   [facility] care site: [facility]  Chief complaint: fall and decreased FM  HPI: Miranda Curtis presents to L&D with complaints of a syncopal episode at 2230, resulting in a fall onto her right abdomen. She reports decreased fetal movement since the fall and abdominal pain rated 8/10. She endorses frequent syncopal episodes, most recently on Monday. She notes increased thin white/clear vaginal discharge today and observed blood on tissue when wiping this morning. She began antibiotic therapy for Trichomonas today after a pharmacy delay. She believes the bleeding may be due to vaginal irritation. She denies contractions.  Factors complicating pregnancy: Patient Active Problem List   Diagnosis Date Noted   Bacterial vaginosis in pregnancy 01/05/2024   Vaginal yeast infection 01/05/2024   Fall 01/04/2024   Trichomonal vaginitis during pregnancy in third trimester 12/26/2023   Low vitamin B12 level 12/13/2023   Malabsorption 12/13/2023   Need for Tdap vaccination 12/11/2023   Need for RSV vaccination 12/11/2023   Influenza Vaccine UTD (12/10/23) 12/11/2023   COVID-19 affecting pregnancy in second trimester 11/11/2023   History of bariatric surgery 10/13/2023   Recurrent syncope 10/05/2023   B12 deficiency 10/05/2023   Iron  deficiency anemia 10/03/2023   Obesity affecting pregnancy, antepartum 09/16/2023   Short interval between pregnancies affecting pregnancy, antepartum 08/12/2023   Maternal varicella, non-immune 08/12/2023   Supervision of normal pregnancy 07/29/2023   Anxiety and depression 07/29/2023   ADHD 07/29/2023   History of gastric bypass 04/11/2022     S: Resting comfortably. no CTX, no VB.no LOF,  Active, but decreased fetal movement.     Prolonged Fetal Monitoring   Indications: Fall and decreased FM   INTERPRETATION:  Mode: External Baseline Rate (A): 135  bpm (fht) Variability: Moderate Accelerations: 15 x 15 Decelerations: None     Contraction Frequency (min): x1/UI   Impression: reactive  EXAM: LIMITED OBSTETRIC ULTRASOUND   COMPARISON:  None Available.   FINDINGS: Number of Fetuses: 1   Heart Rate:  135 bpm   Movement: Yes   Presentation: Cephalic   Placental Location: Fundal, anterior   Previa: No   Amniotic Fluid (Subjective):  Normal   AFI: 13.4 cm   BPD: 8.5 cm 34 w  1 d   MATERNAL FINDINGS:   Cervix:  Closed, 4 cm   Uterus/Adnexae: No abnormality visualized.   IMPRESSION: Approximately 34 week 1 day intrauterine pregnancy. Fetal heart rate 135 beats per minute. No acute maternal findings.   This exam is performed on an emergent basis and does not comprehensively evaluate fetal size, dating, or anatomy; follow-up complete OB US  should be considered if further fetal assessment is warranted.     Electronically Signed   By: Franky Crease M.D.   On: 01/05/2024 00:10       O:  Abdomen: non-tender  External FM and TOCO applied  Limited OB ultrasound: reassuring  Pain improved with Tylenol   Plan:  Prolonged fetal monitoring.  IV fluid bolus.  IV Venofer  for anemia.  Continue antimicrobial treatment for BV, Trichomonas, and yeast infection.  Acetaminophen  PRN for pain.  Monitor vaginal discharge  Supportive care and reassessment.  Follow up with OB in 3 days  Discussed plan of care with supervision physician Dr. ONEIDA. [place]  [name], CNM

## 2024-01-06 ENCOUNTER — Inpatient Hospital Stay (HOSPITAL_COMMUNITY)
Admission: AD | Admit: 2024-01-06 | Discharge: 2024-01-06 | Disposition: A | Discharge status: Home / Self Care | Payer: MEDICAID | Attending: Family Medicine | Admitting: Family Medicine

## 2024-01-06 ENCOUNTER — Encounter (HOSPITAL_BASED_OUTPATIENT_CLINIC_OR_DEPARTMENT_OTHER): Payer: Self-pay | Admitting: Obstetrics & Gynecology

## 2024-01-06 ENCOUNTER — Other Ambulatory Visit: Payer: Self-pay

## 2024-01-06 ENCOUNTER — Encounter (HOSPITAL_COMMUNITY): Payer: Self-pay | Admitting: Obstetrics and Gynecology

## 2024-01-06 ENCOUNTER — Other Ambulatory Visit (HOSPITAL_BASED_OUTPATIENT_CLINIC_OR_DEPARTMENT_OTHER): Payer: Self-pay

## 2024-01-06 DIAGNOSIS — F418 Other specified anxiety disorders: Secondary | ICD-10-CM | POA: Diagnosis not present

## 2024-01-06 DIAGNOSIS — Y92218 Other school as the place of occurrence of the external cause: Secondary | ICD-10-CM | POA: Insufficient documentation

## 2024-01-06 DIAGNOSIS — W1839XA Other fall on same level, initial encounter: Secondary | ICD-10-CM | POA: Insufficient documentation

## 2024-01-06 DIAGNOSIS — F909 Attention-deficit hyperactivity disorder, unspecified type: Secondary | ICD-10-CM | POA: Diagnosis not present

## 2024-01-06 DIAGNOSIS — O99343 Other mental disorders complicating pregnancy, third trimester: Secondary | ICD-10-CM | POA: Insufficient documentation

## 2024-01-06 DIAGNOSIS — W19XXXA Unspecified fall, initial encounter: Secondary | ICD-10-CM

## 2024-01-06 DIAGNOSIS — O26893 Other specified pregnancy related conditions, third trimester: Secondary | ICD-10-CM

## 2024-01-06 DIAGNOSIS — Z9181 History of falling: Secondary | ICD-10-CM | POA: Diagnosis present

## 2024-01-06 DIAGNOSIS — Z3A33 33 weeks gestation of pregnancy: Secondary | ICD-10-CM | POA: Diagnosis not present

## 2024-01-06 DIAGNOSIS — O99843 Bariatric surgery status complicating pregnancy, third trimester: Secondary | ICD-10-CM | POA: Diagnosis not present

## 2024-01-06 DIAGNOSIS — E538 Deficiency of other specified B group vitamins: Secondary | ICD-10-CM | POA: Insufficient documentation

## 2024-01-06 DIAGNOSIS — R55 Syncope and collapse: Secondary | ICD-10-CM

## 2024-01-06 DIAGNOSIS — O99283 Endocrine, nutritional and metabolic diseases complicating pregnancy, third trimester: Secondary | ICD-10-CM | POA: Diagnosis not present

## 2024-01-06 NOTE — MAU Provider Note (Signed)
 History     [id number]  Arrival date and time: 01/06/24 1756    Chief Complaint  Patient presents with   Loss of Consciousness     HPI Miranda Curtis is a 23 y.o. at [redacted]w[redacted]d by 5 wk US  with PMHx notable for recurrent syncope, recent trichomonas infection, b12 deficiency, bariatric surgery, ADHD, anxiety/depression, who presents for episode of syncope.    Review of discharge summary from last admission: patient seen at [facility] for the same issue two days prior. She fell on her abdomen. Was monitored, had reactive NST and prolonged monitoring, unremarkable limited OB US . Given IV venofer  for anemia. Discharged home in stable condition.  On review of chart has cardiology appt scheduled for 01/22/2024  Today reports she was picking her daughter up from school Before she gets these episodes she usually feels dizzy and very warm Today she doesn't recall having any prodrome, reports she was walking up to the pickup area and the person she was talking to said she just fell out in the middle of the conversation Person reported to her that she fell and eventually landed on her back, doesn't think she hit her belly but doesn't know for sure Doesn't know how long she was out for Felt fuzzy, dizzy when she woke up but not confused No shaking spells that she is aware of, with this or other episodes No immediately family hx of heart problems No family hx of sudden death, specifically none with car crashes, in sleep, or while sleeping Reports she has tried to wear a Zio patch twice but they always fall off Has had an echo earlier this pregnancy  No bleeding, leaking fluid, or contractions Fetal movement is normal  --/--/AB POS Performed at [facility], [address], [place], [place] [address]  [phone])  OB History     Gravida  6   Para  2   Term  2   Preterm      AB  3   Living  2      SAB  2   IAB  1   Ectopic      Multiple  0   Live Births  2            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-has resolved following surgery   Eczema    History of depression 11/18/2017   History of diabetes mellitus 04/11/2022   Maternal varicella, non-immune 05/09/2017   Missed abortion 04/07/2019   Vitamin B 12 deficiency 10/26/2017   05/09/22: b12 injection     Vitamin D  deficiency 05/09/2022    Past Surgical History:  Procedure Laterality Date   CHOLECYSTECTOMY N/A 03/13/2023   Procedure: LAPAROSCOPIC CHOLECYSTECTOMY WITH ICG DYE;  Surgeon: [name], MD;  Location: [facility] ORS;  Service: General;  Laterality: N/A;   DILATION AND EVACUATION N/A 02/24/2023   Procedure: DILATATION AND EVACUATION;  Surgeon: [name], MD;  Location: [facility] OR;  Service: Gynecology;  Laterality: N/A;   SLEEVE GASTROPLASTY  10/12/2021   UPPER GASTROINTESTINAL ENDOSCOPY N/A     Family History  Problem Relation Age of Onset   Diabetes Mother    Healthy Mother    Sickle cell trait Mother    Sickle cell trait Father    Rashes /  Skin problems Father        eczema   Asthma Sister    Rashes / Skin problems Sister        eczema   Rashes / Skin problems Sister        eczema   Rashes / Skin problems Sister        eczema   Sickle cell anemia Sister    Rashes / Skin problems Sister        eczema   Autism Brother    Healthy Brother    Autism Daughter    Diabetes Maternal Grandmother    Hypertension Maternal Grandmother    Cancer Maternal Grandfather        prostate   Cancer Paternal Grandmother 71       lung   Healthy Paternal Grandfather    Ovarian cancer Neg Hx    Colon cancer Neg Hx    Breast cancer Neg Hx     Social History   Socioeconomic History   Marital status: Single    Spouse name: Not on file   Number of children: 1   Years of education: 13    Highest education level: Not on file  Occupational History   Occupation: Consulting civil engineer   Occupation: CNA  Tobacco Use   Smoking status: Never   Smokeless tobacco: Never  Vaping Use   Vaping status: Never Used  Substance and Sexual Activity   Alcohol use: No   Drug use: No   Sexual activity: Yes    Partners: Male    Birth control/protection: None    Comment: last SI 08/18/23  Other Topics Concern   Not on file  Social History Narrative   Not on file   Social Drivers of Health   Financial Resource Strain: Low Risk  (07/29/2023)   Overall Financial Resource Strain (CARDIA)    Difficulty of Paying Living Expenses: Not very hard  Food Insecurity: No Food Insecurity (12/02/2023)   Hunger Vital Sign    Worried About Running Out of Food in the Last Year: Never true    Ran Out of Food in the Last Year: Never true  Transportation Needs: No Transportation Needs (12/02/2023)   PRAPARE - Administrator, Civil Service (Medical): No    Lack of Transportation (Non-Medical): No  Physical Activity: Inactive (07/29/2023)   Exercise Vital Sign    Days of Exercise per Week: 0 days    Minutes of Exercise per Session: 0 min  Stress: Stress Concern Present (07/29/2023)   [employer] of Occupational Health - Occupational Stress Questionnaire    Feeling of Stress : To some extent  Social Connections: Unknown (10/06/2023)   Social Connection and Isolation Panel    Frequency of Communication with Friends and Family: Twice a week    Frequency of Social Gatherings with Friends and Family: Once a week    Attends Religious Services: Never    Database administrator or Organizations: No    Attends Banker Meetings: Never    Marital Status: Patient declined  Catering manager Violence: Not At Risk (12/02/2023)   Humiliation, Afraid, Rape, and Kick questionnaire    Fear of Current or Ex-Partner: No    Emotionally Abused: No    Physically Abused: No    Sexually Abused: No     Allergies  Allergen Reactions   Reglan  [Metoclopramide ] Anxiety and Other (See Comments)    Severe panic attack    No current facility-administered medications on file  prior to encounter.   Current Outpatient Medications on File Prior to Encounter  Medication Sig Dispense Refill   metroNIDAZOLE  (FLAGYL ) 500 MG tablet Take 1 tablet (500 mg total) by mouth 2 (two) times daily. 14 tablet 0   aspirin  EC 81 MG tablet Take 1 tablet (81 mg total) by mouth daily. Swallow whole. (Patient not taking: Reported on 12/25/2023) 100 tablet 12   cyanocobalamin  (VITAMIN B12) 1000 MCG/ML injection Inject 1 mL (1,000 mcg total) into the muscle every other day for 30 doses. (Patient not taking: Reported on 12/25/2023) 30 mL 0   Ferric Maltol  (ACCRUFER ) 30 MG CAPS Take 1 capsule (30 mg total) by mouth 2 (two) times daily at 8 am and 10 pm. (Patient not taking: Reported on 12/25/2023) 60 capsule 5   ondansetron  (ZOFRAN ) 8 MG tablet Take 1 tablet (8 mg total) by mouth every 8 (eight) hours as needed for nausea or vomiting. 20 tablet 2   prenatal vitamin w/FE, FA (NATACHEW) 29-1 MG CHEW chewable tablet Chew 1 tablet by mouth daily at 12 noon. (Patient not taking: Reported on 12/25/2023) 30 tablet 2     ROS Pertinent positives and negative per HPI, all others reviewed and negative  Physical Exam   BP 126/70   Pulse (!) 126   Temp 98.5 F (36.9 C) (Oral)   Resp 18   Ht 5' 1 (1.549 m)   Wt 91.6 kg   LMP  (Approximate) Comment: End of January  SpO2 100%   BMI 38.17 kg/m   Patient Vitals for the past 24 hrs:  BP Temp Temp src Pulse Resp SpO2 Height Weight  01/06/24 1803 126/70 98.5 F (36.9 C) Oral (!) 126 18 100 % 5' 1 (1.549 m) 91.6 kg    Physical Exam Vitals reviewed.  Constitutional:      General: She is not in acute distress.    Appearance: She is well-developed. She is not diaphoretic.  Eyes:     General: No scleral icterus. Pulmonary:     Effort: Pulmonary effort is normal. No  respiratory distress.  Abdominal:     General: There is no distension.     Palpations: Abdomen is soft.     Tenderness: There is no abdominal tenderness. There is no guarding or rebound.  Skin:    General: Skin is warm and dry.  Neurological:     Mental Status: She is alert.     Coordination: Coordination normal.      Cervical Exam    Bedside Ultrasound Not performed.  My interpretation: n/a  FHT Baseline: 135 bpm Variability: Good {> 6 bpm) Accelerations: Reactive Decelerations: Absent Uterine activity: None Cat: I  Labs No results found for this or any previous visit (from the past 24 hours).  Imaging No results found.  MAU Course  Procedures Lab Orders  No laboratory test(s) ordered today   No orders of the defined types were placed in this encounter.  Imaging Orders  No imaging studies ordered today    MDM Moderate (Level 3-4)  Assessment and Plan  #Syncope #Fall, initial encounter #[redacted] weeks gestation of pregnancy Extensive history of syncope with multiple episodes in this pregnancy. Symptoms mostly c/w prodrome suggestive of vasovagal, though today's less so. ECG reviewed, no signs to suggest WPW/pre-excitation syndrome. No seizure activity noted. Overall discussed with patient that this likely is vasovagal syncope but agree with recommendation to see Dr. Sheena for further eval. Discussed we also do not have good treatments for vasovagal beyond hydration,  slow positoin changes, etc. Given exact mechanism of fall is unclear recommended four hour obs, during that time tracing was Cat I and no contractions seen.  #FWB FHT Cat I NST: Reactive   Dispo: discharged to home in stable condition    Miranda CHRISTELLA Carolus, MD/MPH 01/06/24 9:09 PM  Allergies as of 01/06/2024       Reactions   Reglan  [metoclopramide ] Anxiety, Other (See Comments)   Severe panic attack        Medication List     TAKE these medications    ACCRUFeR  30 MG Caps Generic  drug: Ferric Maltol  Take 1 capsule (30 mg total) by mouth 2 (two) times daily at 8 am and 10 pm.   aspirin  EC 81 MG tablet Take 1 tablet (81 mg total) by mouth daily. Swallow whole.   cyanocobalamin  1000 MCG/ML injection Commonly known as: VITAMIN B12 Inject 1 mL (1,000 mcg total) into the muscle every other day for 30 doses.   metroNIDAZOLE  500 MG tablet Commonly known as: FLAGYL  Take 1 tablet (500 mg total) by mouth 2 (two) times daily.   ondansetron  8 MG tablet Commonly known as: ZOFRAN  Take 1 tablet (8 mg total) by mouth every 8 (eight) hours as needed for nausea or vomiting.   prenatal vitamin w/FE, FA 29-1 MG Chew chewable tablet Chew 1 tablet by mouth daily at 12 noon.

## 2024-01-06 NOTE — MAU Note (Signed)
 Miranda Curtis is a 23 y.o. at [redacted]w[redacted]d here in MAU reporting: had a syncopal episode around 5 or 5:15 this evening as she was going to pick up her daughter. Was standing in a grassy area when she lost consciousness. When she came to she was on her back in the grass. States this happened on Saturday as well and she was seen at [facility]. On Saturday she hit her belly. Does not believe she hit her belly this time. States she is unsure if she hit her head.   Reports +FM. Denies VB, LOF, ctx.     Pain score: 6/10 head  Vitals:   01/06/24 1803  BP: 126/70  Pulse: (!) 126  Resp: 18  Temp: 98.5 F (36.9 C)  SpO2: 100%     FHT: 150  Lab orders placed from triage:

## 2024-01-08 ENCOUNTER — Ambulatory Visit (INDEPENDENT_AMBULATORY_CARE_PROVIDER_SITE_OTHER): Payer: MEDICAID | Admitting: Obstetrics & Gynecology

## 2024-01-08 ENCOUNTER — Telehealth: Payer: Self-pay

## 2024-01-08 VITALS — BP 101/74 | HR 108 | Wt 202.8 lb

## 2024-01-08 DIAGNOSIS — O99013 Anemia complicating pregnancy, third trimester: Secondary | ICD-10-CM | POA: Diagnosis not present

## 2024-01-08 DIAGNOSIS — O0993 Supervision of high risk pregnancy, unspecified, third trimester: Secondary | ICD-10-CM

## 2024-01-08 DIAGNOSIS — Z9884 Bariatric surgery status: Secondary | ICD-10-CM

## 2024-01-08 DIAGNOSIS — A5901 Trichomonal vulvovaginitis: Secondary | ICD-10-CM

## 2024-01-08 DIAGNOSIS — O26893 Other specified pregnancy related conditions, third trimester: Secondary | ICD-10-CM

## 2024-01-08 DIAGNOSIS — O23593 Infection of other part of genital tract in pregnancy, third trimester: Secondary | ICD-10-CM | POA: Diagnosis not present

## 2024-01-08 DIAGNOSIS — D508 Other iron deficiency anemias: Secondary | ICD-10-CM

## 2024-01-08 DIAGNOSIS — K9089 Other intestinal malabsorption: Secondary | ICD-10-CM

## 2024-01-08 DIAGNOSIS — O99843 Bariatric surgery status complicating pregnancy, third trimester: Secondary | ICD-10-CM

## 2024-01-08 DIAGNOSIS — Z3A34 34 weeks gestation of pregnancy: Secondary | ICD-10-CM

## 2024-01-08 DIAGNOSIS — Z3483 Encounter for supervision of other normal pregnancy, third trimester: Secondary | ICD-10-CM

## 2024-01-08 DIAGNOSIS — R55 Syncope and collapse: Secondary | ICD-10-CM

## 2024-01-08 NOTE — Telephone Encounter (Signed)
 Called pt to let her know her appt has been moved to Oct 14th per her request. Instructed pt to return her monitor today so we will have her report back by the time she comes in for her appointment.  Pt verbalized understanding.

## 2024-01-09 ENCOUNTER — Ambulatory Visit: Payer: MEDICAID | Attending: Maternal & Fetal Medicine | Admitting: Obstetrics and Gynecology

## 2024-01-09 ENCOUNTER — Ambulatory Visit (HOSPITAL_BASED_OUTPATIENT_CLINIC_OR_DEPARTMENT_OTHER): Payer: MEDICAID

## 2024-01-09 VITALS — BP 126/68 | HR 91

## 2024-01-09 DIAGNOSIS — F419 Anxiety disorder, unspecified: Secondary | ICD-10-CM

## 2024-01-09 DIAGNOSIS — O99013 Anemia complicating pregnancy, third trimester: Secondary | ICD-10-CM | POA: Diagnosis not present

## 2024-01-09 DIAGNOSIS — Z3A34 34 weeks gestation of pregnancy: Secondary | ICD-10-CM | POA: Diagnosis not present

## 2024-01-09 DIAGNOSIS — Z362 Encounter for other antenatal screening follow-up: Secondary | ICD-10-CM | POA: Insufficient documentation

## 2024-01-09 DIAGNOSIS — D509 Iron deficiency anemia, unspecified: Secondary | ICD-10-CM | POA: Insufficient documentation

## 2024-01-09 DIAGNOSIS — O99843 Bariatric surgery status complicating pregnancy, third trimester: Secondary | ICD-10-CM

## 2024-01-09 DIAGNOSIS — Z9884 Bariatric surgery status: Secondary | ICD-10-CM | POA: Diagnosis not present

## 2024-01-09 DIAGNOSIS — O99213 Obesity complicating pregnancy, third trimester: Secondary | ICD-10-CM | POA: Insufficient documentation

## 2024-01-09 DIAGNOSIS — R296 Repeated falls: Secondary | ICD-10-CM | POA: Diagnosis not present

## 2024-01-09 DIAGNOSIS — O09892 Supervision of other high risk pregnancies, second trimester: Secondary | ICD-10-CM

## 2024-01-09 DIAGNOSIS — O99210 Obesity complicating pregnancy, unspecified trimester: Secondary | ICD-10-CM

## 2024-01-09 DIAGNOSIS — W19XXXA Unspecified fall, initial encounter: Secondary | ICD-10-CM | POA: Diagnosis not present

## 2024-01-09 DIAGNOSIS — E669 Obesity, unspecified: Secondary | ICD-10-CM

## 2024-01-09 DIAGNOSIS — O9A213 Injury, poisoning and certain other consequences of external causes complicating pregnancy, third trimester: Secondary | ICD-10-CM | POA: Insufficient documentation

## 2024-01-09 DIAGNOSIS — O99343 Other mental disorders complicating pregnancy, third trimester: Secondary | ICD-10-CM

## 2024-01-09 DIAGNOSIS — O09893 Supervision of other high risk pregnancies, third trimester: Secondary | ICD-10-CM | POA: Diagnosis not present

## 2024-01-09 NOTE — Progress Notes (Addendum)
 Maternal-Fetal Medicine Consultation  Name: Miranda Curtis  MRN: [id number]  GA: [id number] [redacted]w[redacted]d   Patient is here for fetal growth assessment. Her problems include: - History of bariatric surgery (July 2023).  Patient does not give history of abdominal pain. - Short interval between pregnancies.  Obstetric history significant for 3 term vaginal deliveries and the most recent 1 was in July 2024.  All her children are in good health. - Pregravid BMI 37. - Multiple falls during pregnancy.  Patient reports she fell at least 10 times during this pregnancy.  She was evaluated 3 days ago at the MAU for syncopal episode.  Patient has an appointment with our cardiologist at cardio obstetrics clinic.  She had echocardiography in June 2025 that showed a left ventricular ejection fraction of 65% to 70%.  No structural heart disease was seen.  Long-term monitor-life telemetry reading results are pending. - Iron  deficiency anemia in pregnancy.  Recent hemoglobin was 9.1 g/dL and the patient is getting iron  infusions.  Patient does not have gestational diabetes.  Blood pressure today at our office is 126/68 mmHg.  Ultrasound Normal fetal growth and amniotic fluid.  Cephalic presentation. Incidentally observed BPP is reassuring (8/8).  Our concerns include Syncopal episodes and multiple falls Patient had normal echocardiography.  It is difficult to ascertain the contribution of anemia to her syncopal episodes.  I encouraged her to keep her cardiology appointment. Pregnancy can contribute to syncopal episodes to some extent (supine hypotensive syndrome). Patient requests early term delivery at 37-or 38-weeks' gestation. I counseled the patient that after her visit with cardiologist, the providers can discuss and decide on early term delivery. If pregnancy is one of the causes of syncopal episodes, early term delivery is reasonable to prevent further falls.  History of bariatric surgery and anemia Patient is  aware that bariatric surgery can lead to nutritional deficiencies.  I discussed the importance of correction of anemia.  Iron  infusions may take a few weeks to increase hemoglobin levels.  I encouraged her to keep her appointment for iron  infusions. If patient experiences abdominal pain, she should get evaluated to rule out bowel obstruction.  Short Interpregnancy Interval It is defined as the time interval (18 to 24 months) between the end of previous pregnancy and the beginning of next pregnancy. Short interval can be associated with preterm delivery and fetal growth restriction.  I reassured the patient of normal fetal growth assessment on today's ultrasound.  Recommendations -Follow-up scans as clinically indicated. - Timing of delivery to be decided after cardiology consultation.       Consultation including face-to-face (more than 50%) counseling 30 minutes.

## 2024-01-10 NOTE — Progress Notes (Signed)
 PRENATAL VISIT NOTE  Subjective:  Miranda Curtis is a 23 y.o. [id number] at [redacted]w[redacted]d being seen today for ongoing prenatal care.  She is currently monitored for the following issues for this high-risk pregnancy and has History of gastric bypass; Supervision of normal pregnancy; Anxiety and depression; ADHD; Short interval between pregnancies affecting pregnancy, antepartum; Maternal varicella, non-immune; Obesity affecting pregnancy, antepartum; Iron  deficiency anemia; Recurrent syncope; B12 deficiency; History of bariatric surgery; COVID-19 affecting pregnancy in second trimester; Need for Tdap vaccination; Need for RSV vaccination; Influenza Vaccine UTD (12/10/23); Low vitamin B12 level; Malabsorption; Trichomonal vaginitis during pregnancy in third trimester; Fall; Bacterial vaginosis in pregnancy; and Vaginal yeast infection on their problem list.  Patient reports she keeps having syncopal episodes.  They occur now without almost any warning.  Has needed to go to the MAU multiple times.  Has fallen on abdomen as well.  H/O iron  def anemia.  Still has iron  infusion she hasn't finished.  Has not seen Dr. Sheena yet.  States she can't make the 10/15 appt so I will try to adjust this.  Also, cardiac monitoring fell off after 7 days.  She didn't send it in .  I will reach out to Dr. Sheena about whether it needs to be repeated.  Worried about the falls and risk to pregnancy.  Feel earlier delivery is reasonable but has MFM appt tomorrow so want her to discuss then as well.    Contractions: Not present. Vag. Bleeding: None.  Movement: Present. Denies leaking of fluid.   Positive trichomonas testing 9/17 and 9/27.  Has started the flagyl .  Aware partner needs treatment prior to being SA again.  States she is not going to be SA until after the baby is born.    The following portions of the patient's history were reviewed and updated as appropriate: allergies, current medications, past family history, past medical  history, past social history, past surgical history and problem list.   Objective:    Vitals:   01/08/24 0933  BP: 101/74  Pulse: (!) 108  Weight: 202 lb 12.8 oz (92 kg)    Fetal Status:  Fetal Heart Rate (bpm): 150 Fundal Height: 34 cm Movement: Present Presentation: Vertex  General: Alert, oriented and cooperative. Patient is in no acute distress.  Skin: Skin is warm and dry. No rash noted.   Cardiovascular: Normal heart rate noted  Respiratory: Normal respiratory effort, no problems with respiration noted  Abdomen: Soft, gravid, appropriate for gestational age.  Pain/Pressure: Present (pt reports some pressure in back and abdomen)     Pelvic: Cervical exam deferred        Extremities: Normal range of motion.  Edema: None  Mental Status: Normal mood and affect. Normal behavior. Normal judgment and thought content.   Assessment and Plan:  Pregnancy: [id number] at [redacted]w[redacted]d 1. Encounter for supervision of other normal pregnancy in third trimester (Primary) - on PNV and baby ASA  2. [redacted] weeks gestation of pregnancy - follow up scheduled 2 weeks but may need to adjust to recheck 1 weeks  3. Trichomonal vaginitis during pregnancy in third trimester - pt aware she may have positive testing for a few weeks, even after treatment.  Plan to recheck around 36 weeks  4. Syncope, unspecified syncope type - has not had complete cardiac evaluation at this point.  Reached out to Dr. Sheena who advised she could go ahead and send in 7 days of cardiac monitoring that was done  5. History of gastric bypass -  growth scan scheduled tomorrow  6. Other iron  deficiency anemia - reached out to infusion center and they will get her set up with another iron  infusion   7. Other specified intestinal malabsorption - h/o prior bariatric surgery  Preterm labor symptoms and general obstetric precautions including but not limited to vaginal bleeding, contractions, leaking of fluid and fetal movement were  reviewed in detail with the patient. Please refer to After Visit Summary for other counseling recommendations.   Return in about 2 weeks (around 01/22/2024).  Future Appointments  Date Time Provider Department Center  01/13/2024 11:00 AM [facility] None  01/21/2024 10:00 AM Tobb, Kardie, DO [facility]  01/21/2024  3:35 PM Cleotilde Ronal RAMAN, MD [facility] [id number]  01/27/2024  2:55 PM Lo, Arland POUR, CNM [facility] [id number]  01/29/2024 10:45 AM Gregg Lek, MD [facility] None  02/05/2024  9:15 AM Tad, Arland POUR, CNM [facility] [id number]  02/13/2024  9:15 AM Delores Nidia CROME, FNP [facility] [phone] [address]  02/20/2024 10:15 AM Delores Nidia CROME, FNP [facility] [phone] [address]    Ronal RAMAN Cleotilde, MD

## 2024-01-13 ENCOUNTER — Ambulatory Visit (INDEPENDENT_AMBULATORY_CARE_PROVIDER_SITE_OTHER): Payer: MEDICAID | Admitting: *Deleted

## 2024-01-13 VITALS — BP 110/72 | HR 97 | Temp 98.9°F | Resp 16 | Ht 61.0 in | Wt 205.0 lb

## 2024-01-13 DIAGNOSIS — D509 Iron deficiency anemia, unspecified: Secondary | ICD-10-CM | POA: Diagnosis not present

## 2024-01-13 MED ORDER — SODIUM CHLORIDE 0.9 % IV BOLUS
250.0000 mL | Freq: Once | INTRAVENOUS | Status: AC
  Administered 2024-01-13: 250 mL via INTRAVENOUS
  Filled 2024-01-13: qty 250

## 2024-01-13 MED ORDER — IRON SUCROSE 20 MG/ML IV SOLN
200.0000 mg | Freq: Once | INTRAVENOUS | Status: AC
  Administered 2024-01-13: 200 mg via INTRAVENOUS
  Filled 2024-01-13: qty 10

## 2024-01-13 NOTE — Progress Notes (Signed)
 Diagnosis: Acute Anemia  Provider:  Mannam, Praveen MD  Procedure: IV Push  IV Type: Peripheral, IV Location: R Antecubital  Venofer (Iron  Sucrose), Dose: 200 mg  Post Infusion IV Care: Observation period completed  Discharge: Condition: Good, Destination: Home . AVS Declined  Performed by:  Mathew Therisa NOVAK, RN

## 2024-01-15 ENCOUNTER — Other Ambulatory Visit (HOSPITAL_COMMUNITY)
Admission: RE | Admit: 2024-01-15 | Discharge: 2024-01-15 | Disposition: A | Discharge status: Home / Self Care | Payer: MEDICAID | Source: Ambulatory Visit | Attending: Obstetrics & Gynecology | Admitting: Obstetrics & Gynecology

## 2024-01-15 ENCOUNTER — Ambulatory Visit (HOSPITAL_BASED_OUTPATIENT_CLINIC_OR_DEPARTMENT_OTHER): Payer: MEDICAID | Admitting: Obstetrics & Gynecology

## 2024-01-15 VITALS — BP 111/68 | HR 103 | Wt 203.8 lb

## 2024-01-15 DIAGNOSIS — O09899 Supervision of other high risk pregnancies, unspecified trimester: Secondary | ICD-10-CM

## 2024-01-15 DIAGNOSIS — O99013 Anemia complicating pregnancy, third trimester: Secondary | ICD-10-CM | POA: Diagnosis not present

## 2024-01-15 DIAGNOSIS — Z3A35 35 weeks gestation of pregnancy: Secondary | ICD-10-CM

## 2024-01-15 DIAGNOSIS — D509 Iron deficiency anemia, unspecified: Secondary | ICD-10-CM

## 2024-01-15 DIAGNOSIS — O26893 Other specified pregnancy related conditions, third trimester: Secondary | ICD-10-CM

## 2024-01-15 DIAGNOSIS — O99824 Streptococcus B carrier state complicating childbirth: Secondary | ICD-10-CM | POA: Diagnosis not present

## 2024-01-15 DIAGNOSIS — O99891 Other specified diseases and conditions complicating pregnancy: Secondary | ICD-10-CM | POA: Diagnosis not present

## 2024-01-15 DIAGNOSIS — Z3483 Encounter for supervision of other normal pregnancy, third trimester: Secondary | ICD-10-CM

## 2024-01-15 DIAGNOSIS — N898 Other specified noninflammatory disorders of vagina: Secondary | ICD-10-CM

## 2024-01-15 DIAGNOSIS — Z9884 Bariatric surgery status: Secondary | ICD-10-CM

## 2024-01-15 DIAGNOSIS — R55 Syncope and collapse: Secondary | ICD-10-CM

## 2024-01-15 DIAGNOSIS — O99019 Anemia complicating pregnancy, unspecified trimester: Secondary | ICD-10-CM

## 2024-01-15 MED ORDER — TERCONAZOLE 0.4 % VA CREA: 1.0000 | TOPICAL_CREAM | Freq: Every day | VAGINAL | 0 refills | Status: DC

## 2024-01-15 NOTE — Progress Notes (Signed)
   PRENATAL VISIT NOTE  Subjective:  Miranda Curtis is a 23 y.o. [phone] at [redacted]w[redacted]d being seen today for ongoing prenatal care.  She is currently monitored for the following issues for this low-risk pregnancy and has History of gastric bypass; Supervision of normal pregnancy; Anxiety and depression; ADHD; Short interval between pregnancies affecting pregnancy, antepartum; Maternal varicella, non-immune; Obesity affecting pregnancy, antepartum; Vasovagal syncope; B12 deficiency; History of bariatric surgery; COVID-19 affecting pregnancy in second trimester; Malabsorption; Trichomonal vaginitis during pregnancy in third trimester; and Iron  deficiency anemia during pregnancy on their problem list.  Patient reports continued issues with feeling light she is going to pass out as well as episodes of syncope.  Has appt with Dr. Sheena 10/14 and importance of keeping this discussed.  Has MFM follow up after that and will see about recommendations for delivery.  Pt aware of risks for abruption with falling at this point.  Trying to be around people if possible.  Contractions: Irregular. Vag. Bleeding: None.  Movement: Present. Denies leaking of fluid.   The following portions of the patient's history were reviewed and updated as appropriate: allergies, current medications, past family history, past medical history, past social history, past surgical history and problem list.   Objective:   Vitals:   01/15/24 1458  BP: 111/68  Pulse: (!) 103  Weight: 203 lb 12.8 oz (92.4 kg)    Fetal Status: Fetal Heart Rate (bpm): 145 Fundal Height: 35 cm  Movement: Present Presentation: Vertex  General:  Alert, oriented and cooperative. Patient is in no acute distress.  Skin: Skin is warm and dry. No rash noted.   Cardiovascular: Normal heart rate noted  Respiratory: Normal respiratory effort, no problems with respiration noted  Abdomen: Soft, gravid, appropriate for gestational age.  Pain/Pressure: Present (lower pelvic  pressure and lower back pain)     Pelvic: Cervical exam performed in the presence of a chaperone      swab obtained.  With cervical exam, cervix high and could not adequately feel for dilation  Extremities: Normal range of motion.  Edema: Trace  Mental Status: Normal mood and affect. Normal behavior. Normal judgment and thought content.   Assessment and Plan:  Pregnancy: [id number] at [redacted]w[redacted]d 1. Encounter for supervision of other normal pregnancy in third trimester (Primary) - on PNV - recheck 1 week  2. [redacted] weeks gestation of pregnancy - Culture, beta strep (group b only)  3. Vaginal discharge - will test for vaginitis today.  H/o trich in pregnancy with negative TOC. - Cervicovaginal ancillary only( [facility])  4. Short interval between pregnancies affecting pregnancy, antepartum  5. Iron  deficiency anemia during pregnancy - has not completed iron  infusions that were ordered.  She will and then will need additional infusions ordered. Advised to go ahead with scheduling next infusions when she is at the infusion center  6. History of gastric bypass  7.  Syncope - importance of keeping appt with Dr. Sheena discussed  Preterm labor symptoms and general obstetric precautions including but not limited to vaginal bleeding, contractions, leaking of fluid and fetal movement were reviewed in detail with the patient. Please refer to After Visit Summary for other counseling recommendations.    Ronal GORMAN Pinal, MD

## 2024-01-16 LAB — CERVICOVAGINAL ANCILLARY ONLY
Bacterial Vaginitis (gardnerella): NEGATIVE
Candida Glabrata: NEGATIVE
Candida Vaginitis: NEGATIVE
Chlamydia: NEGATIVE
Comment: NEGATIVE
Comment: NEGATIVE
Comment: NEGATIVE
Comment: NEGATIVE
Comment: NEGATIVE
Comment: NORMAL
Neisseria Gonorrhea: NEGATIVE
Trichomonas: NEGATIVE

## 2024-01-17 ENCOUNTER — Encounter (HOSPITAL_BASED_OUTPATIENT_CLINIC_OR_DEPARTMENT_OTHER): Payer: Self-pay | Admitting: Obstetrics & Gynecology

## 2024-01-17 ENCOUNTER — Ambulatory Visit (HOSPITAL_BASED_OUTPATIENT_CLINIC_OR_DEPARTMENT_OTHER): Payer: Self-pay | Admitting: Obstetrics & Gynecology

## 2024-01-18 ENCOUNTER — Inpatient Hospital Stay (HOSPITAL_COMMUNITY): Payer: MEDICAID

## 2024-01-18 ENCOUNTER — Inpatient Hospital Stay (HOSPITAL_COMMUNITY)
Admission: AD | Admit: 2024-01-18 | Discharge: 2024-01-18 | Disposition: A | Discharge status: Home / Self Care | Payer: MEDICAID | Attending: Obstetrics & Gynecology | Admitting: Obstetrics & Gynecology

## 2024-01-18 ENCOUNTER — Other Ambulatory Visit: Payer: Self-pay

## 2024-01-18 ENCOUNTER — Encounter (HOSPITAL_COMMUNITY): Payer: Self-pay | Admitting: Obstetrics & Gynecology

## 2024-01-18 DIAGNOSIS — O26893 Other specified pregnancy related conditions, third trimester: Secondary | ICD-10-CM | POA: Diagnosis not present

## 2024-01-18 DIAGNOSIS — O99213 Obesity complicating pregnancy, third trimester: Secondary | ICD-10-CM

## 2024-01-18 DIAGNOSIS — Z3A35 35 weeks gestation of pregnancy: Secondary | ICD-10-CM

## 2024-01-18 DIAGNOSIS — E669 Obesity, unspecified: Secondary | ICD-10-CM

## 2024-01-18 DIAGNOSIS — O99343 Other mental disorders complicating pregnancy, third trimester: Secondary | ICD-10-CM | POA: Diagnosis not present

## 2024-01-18 DIAGNOSIS — O368130 Decreased fetal movements, third trimester, not applicable or unspecified: Secondary | ICD-10-CM

## 2024-01-18 DIAGNOSIS — F419 Anxiety disorder, unspecified: Secondary | ICD-10-CM

## 2024-01-18 DIAGNOSIS — R519 Headache, unspecified: Secondary | ICD-10-CM

## 2024-01-18 DIAGNOSIS — Z3493 Encounter for supervision of normal pregnancy, unspecified, third trimester: Secondary | ICD-10-CM

## 2024-01-18 MED ORDER — BUTALBITAL-APAP-CAFFEINE 50-325-40 MG PO TABS: 1.0000 | ORAL_TABLET | Freq: Four times a day (QID) | ORAL | 0 refills | Status: DC | PRN

## 2024-01-18 MED ORDER — CYCLOBENZAPRINE HCL 5 MG PO TABS
10.0000 mg | ORAL_TABLET | Freq: Once | ORAL | Status: AC
  Administered 2024-01-18: 10 mg via ORAL
  Filled 2024-01-18: qty 2

## 2024-01-18 NOTE — MAU Provider Note (Signed)
 History     CSN: [id number]  Arrival date and time: 01/18/24 9057   Event Date/Time   First Provider Initiated Contact with Patient 01/18/24 1018      Chief Complaint  Patient presents with   Decreased Fetal Movement   Contractions   Headache   HPI  Miranda Curtis is a 23 y.o. [id number] at [redacted]w[redacted]d who presents for evaluation of headache and decreased fetal movement. Patient reports she has had a headache since Tuesday. She reports she tried tylenol  with no improvement. She reports she is eating and drinking regularly. Patient rates the pain as a 7/10 and has not tried anything for the pain. She reports the baby isn't moving as much today.She denies any vaginal bleeding, discharge, and leaking of fluid. Denies any constipation, diarrhea or any urinary complaints.   OB History     Gravida  6   Para  2   Term  2   Preterm      AB  3   Living  2      SAB  2   IAB  1   Ectopic      Multiple  0   Live Births  2           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-has resolved following surgery   Eczema    History of depression 11/18/2017   History of diabetes mellitus 04/11/2022   Maternal varicella, non-immune 05/09/2017   Missed abortion 04/07/2019   Vitamin B 12 deficiency 10/26/2017   05/09/22: b12 injection     Vitamin D  deficiency 05/09/2022    Past Surgical History:  Procedure Laterality Date   CHOLECYSTECTOMY N/A 03/13/2023   Procedure: LAPAROSCOPIC CHOLECYSTECTOMY WITH ICG DYE;  Surgeon: [name], MD;  Location: [facility];  Service: General;  Laterality: N/A;   DILATION AND EVACUATION N/A 02/24/2023   Procedure: DILATATION AND EVACUATION;  Surgeon: [name], MD;  Location: [facility] OR;  Service: Gynecology;  Laterality: N/A;   SLEEVE GASTROPLASTY  10/12/2021    UPPER GASTROINTESTINAL ENDOSCOPY N/A     Family History  Problem Relation Age of Onset   Diabetes Mother    Healthy Mother    Sickle cell trait Mother    Sickle cell trait Father    Rashes / Skin problems Father        eczema   Asthma Sister    Rashes / Skin problems Sister        eczema   Rashes / Skin problems Sister        eczema   Rashes / Skin problems Sister        eczema   Sickle cell anemia Sister    Rashes / Skin problems Sister        eczema   Autism Brother    Healthy Brother    Autism Daughter    Diabetes Maternal Grandmother    Hypertension Maternal Grandmother    Cancer Maternal Grandfather        prostate   Cancer Paternal Grandmother 50       lung   Healthy Paternal Grandfather    Ovarian cancer Neg Hx    Colon cancer Neg Hx    Breast cancer Neg Hx     Social History   Tobacco  Use   Smoking status: Never   Smokeless tobacco: Never  Vaping Use   Vaping status: Never Used  Substance Use Topics   Alcohol use: No   Drug use: No    Allergies:  Allergies  Allergen Reactions   Reglan  [Metoclopramide ] Anxiety and Other (See Comments)    Severe panic attack    No medications prior to admission.    Review of Systems  Constitutional: Negative.  Negative for fatigue and fever.  HENT: Negative.    Respiratory: Negative.  Negative for shortness of breath.   Cardiovascular: Negative.  Negative for chest pain.  Gastrointestinal:  Positive for abdominal pain. Negative for constipation, diarrhea, nausea and vomiting.  Genitourinary: Negative.  Negative for dysuria.  Neurological:  Positive for headaches. Negative for dizziness.   Physical Exam   Blood pressure 106/89, pulse (!) 111, temperature 98.2 F (36.8 C), temperature source Oral, resp. rate 16, height 5' 1 (1.549 m), weight 98.4 kg, SpO2 99%, not currently breastfeeding.  Patient Vitals for the past 24 hrs:  BP Temp Temp src Pulse Resp SpO2 Height Weight  01/18/24 1125 106/89 -- --  (!) 111 -- 99 % -- --  01/18/24 1008 113/76 98.2 F (36.8 C) Oral (!) 113 16 100 % 5' 1 (1.549 m) 98.4 kg  01/18/24 1007 -- -- -- -- -- 100 % -- --    Physical Exam Vitals and nursing note reviewed.  Constitutional:      General: She is not in acute distress.    Appearance: She is well-developed.  HENT:     Head: Normocephalic.  Eyes:     Pupils: Pupils are equal, round, and reactive to light.  Cardiovascular:     Rate and Rhythm: Normal rate and regular rhythm.     Heart sounds: Normal heart sounds.  Pulmonary:     Effort: Pulmonary effort is normal. No respiratory distress.     Breath sounds: Normal breath sounds.  Abdominal:     General: Bowel sounds are normal. There is no distension.     Palpations: Abdomen is soft.     Tenderness: There is no abdominal tenderness.  Skin:    General: Skin is warm and dry.  Neurological:     Mental Status: She is alert and oriented to person, place, and time.  Psychiatric:        Mood and Affect: Mood normal.        Behavior: Behavior normal.        Thought Content: Thought content normal.        Judgment: Judgment normal.     Fetal Tracing:  Baseline: 140 Variability: moderate Accels: 15x15 Decels: 2 variables, when patient was sitting forward to eat dinner, unclear if true decelerations  Toco: uterine irritability  Cervix: 1cm/thick/posterior  MAU Course  Procedures  MDM Labs ordered and reviewed.   NST reactive US  MFM BPP- 8/8 Flexeril - minimal improvement of pain. Patient driving today and unable to give stronger pain medication while in MAU. Discussed trying limited rx of fioricet  at home to help and patient agreeable.   Assessment and Plan   1. Pregnancy headache in third trimester   2. [redacted] weeks gestation of pregnancy   3. Movement of fetus present during pregnancy in third trimester     -Discharge home in stable condition -Rx for fioricet  sent to pharmacy -Preterm labor precautions discussed -Patient  advised to follow-up with OB as scheduled for prenatal care -Patient may return to MAU as needed or if  her condition were to change or worsen  Aleck CHRISTELLA Fireman, CNM 01/18/2024, 10:18 AM

## 2024-01-18 NOTE — Discharge Instructions (Signed)

## 2024-01-18 NOTE — MAU Note (Signed)
 Miranda Curtis is a 23 y.o. at [redacted]w[redacted]d here in MAU reporting: Headache began Wednesday, Cramping began Thursday irregularly, Decreased fetal movement since yesterday.   LMP: Approximately end of January Onset of complaint: 10/8 Headache, 10/9 Cramping, DCFM 10/10 Pain score: Headache 8/10 Tylenol  does not relieve it, Cramping 8/10 There were no vitals filed for this visit.  98.27f RR 16, 113/76, HR 113 FHT: 145  Lab orders placed from triage: none

## 2024-01-19 LAB — CULTURE, BETA STREP (GROUP B ONLY): Strep Gp B Culture: NEGATIVE

## 2024-01-20 ENCOUNTER — Ambulatory Visit (INDEPENDENT_AMBULATORY_CARE_PROVIDER_SITE_OTHER): Payer: MEDICAID

## 2024-01-20 VITALS — BP 98/64 | HR 98 | Temp 98.1°F | Resp 18 | Ht 61.0 in | Wt 205.4 lb

## 2024-01-20 DIAGNOSIS — D509 Iron deficiency anemia, unspecified: Secondary | ICD-10-CM | POA: Diagnosis not present

## 2024-01-20 MED ORDER — IRON SUCROSE 20 MG/ML IV SOLN
200.0000 mg | Freq: Once | INTRAVENOUS | Status: AC
  Administered 2024-01-20: 200 mg via INTRAVENOUS
  Filled 2024-01-20: qty 10

## 2024-01-20 MED ORDER — SODIUM CHLORIDE 0.9 % IV BOLUS
250.0000 mL | Freq: Once | INTRAVENOUS | Status: AC
  Administered 2024-01-20: 250 mL via INTRAVENOUS
  Filled 2024-01-20: qty 250

## 2024-01-20 NOTE — Progress Notes (Signed)
 Diagnosis: Iron Deficiency Anemia  Provider:  Chilton Greathouse MD  Procedure: IV Push  IV Type: Peripheral, IV Location: R Antecubital  Venofer (Iron Sucrose), Dose: 200 mg  Post Infusion IV Care: Observation period completed and Peripheral IV Discontinued  Discharge: Condition: Good, Destination: Home . AVS Declined  Performed by:  Loney Hering, LPN

## 2024-01-21 ENCOUNTER — Ambulatory Visit (INDEPENDENT_AMBULATORY_CARE_PROVIDER_SITE_OTHER): Payer: MEDICAID | Admitting: Obstetrics & Gynecology

## 2024-01-21 ENCOUNTER — Encounter (HOSPITAL_BASED_OUTPATIENT_CLINIC_OR_DEPARTMENT_OTHER): Payer: Self-pay | Admitting: Obstetrics & Gynecology

## 2024-01-21 ENCOUNTER — Ambulatory Visit: Payer: MEDICAID | Attending: Cardiology | Admitting: Cardiology

## 2024-01-21 ENCOUNTER — Encounter (HOSPITAL_BASED_OUTPATIENT_CLINIC_OR_DEPARTMENT_OTHER): Payer: MEDICAID | Admitting: Certified Nurse Midwife

## 2024-01-21 ENCOUNTER — Ambulatory Visit: Payer: MEDICAID

## 2024-01-21 ENCOUNTER — Encounter: Payer: Self-pay | Admitting: Cardiology

## 2024-01-21 VITALS — BP 98/63 | HR 95 | Wt 202.4 lb

## 2024-01-21 VITALS — BP 98/58 | HR 107 | Ht 61.0 in | Wt 203.2 lb

## 2024-01-21 DIAGNOSIS — R55 Syncope and collapse: Secondary | ICD-10-CM | POA: Insufficient documentation

## 2024-01-21 DIAGNOSIS — A5901 Trichomonal vulvovaginitis: Secondary | ICD-10-CM

## 2024-01-21 DIAGNOSIS — O09899 Supervision of other high risk pregnancies, unspecified trimester: Secondary | ICD-10-CM

## 2024-01-21 DIAGNOSIS — Z3A35 35 weeks gestation of pregnancy: Secondary | ICD-10-CM | POA: Diagnosis present

## 2024-01-21 DIAGNOSIS — R42 Dizziness and giddiness: Secondary | ICD-10-CM | POA: Diagnosis present

## 2024-01-21 DIAGNOSIS — O99210 Obesity complicating pregnancy, unspecified trimester: Secondary | ICD-10-CM | POA: Diagnosis present

## 2024-01-21 DIAGNOSIS — O99013 Anemia complicating pregnancy, third trimester: Secondary | ICD-10-CM

## 2024-01-21 DIAGNOSIS — O26893 Other specified pregnancy related conditions, third trimester: Secondary | ICD-10-CM | POA: Diagnosis not present

## 2024-01-21 DIAGNOSIS — D509 Iron deficiency anemia, unspecified: Secondary | ICD-10-CM

## 2024-01-21 DIAGNOSIS — Z3483 Encounter for supervision of other normal pregnancy, third trimester: Secondary | ICD-10-CM

## 2024-01-21 NOTE — Progress Notes (Signed)
 Cardio-Obstetrics Clinic  New Evaluation  Date:  01/21/2024   ID:  Miranda Curtis, DOB 2000/11/12, MRN [id number]  PCP:  Freddrick Johns   [facility] Providers Cardiologist:  Lonni LITTIE Nanas, MD  Electrophysiologist:  None       Referring MD: Miranda Curtis LABOR,*   Chief Complaint:  I am doing well  History of Present Illness:    Miranda Curtis is a 23 y.o. female [G6P2032] who is being seen today for the evaluation of syncope in pregnancy at the request of Left[place]-Kirby, Curtis LABOR,*.   Discussed the use of AI scribe software for clinical note transcription with the patient, who gave verbal consent to proceed   She experiences frequent syncope and dizziness, beginning around 20 weeks into her current pregnancy. Episodes are often preceded by dizziness and heat, with occasional nausea. Recently, she experienced syncope while driving, necessitating a stop to rest. Her boyfriend witnessed this incident.  She has worn a few ambulatory monitor but the adhesive has not stay on  for over 24 hrs  unsuccessful due to issues with the device detaching from sweating and showering, preventing complete data collection.  During her previous pregnancy, syncope began in the second trimester and persisted into the third. She has two children, aged six and one, and is currently expecting a girl.  She experiences daily dizziness and sometimes feels 'really hot' before syncope. No palpitations are reported, but she describes cold and warm sensations before episodes.   Prior CV Studies Reviewed: The following studies were reviewed today: Echo reviewed  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-has resolved following surgery   Eczema    History of depression 11/18/2017   History of diabetes  mellitus 04/11/2022   Maternal varicella, non-immune 05/09/2017   Missed abortion 04/07/2019   Vitamin B 12 deficiency 10/26/2017   05/09/22: b12 injection     Vitamin D  deficiency 05/09/2022    Past Surgical History:  Procedure Laterality Date   CHOLECYSTECTOMY N/A 03/13/2023   Procedure: LAPAROSCOPIC CHOLECYSTECTOMY WITH ICG DYE;  Surgeon: [name], MD;  Location: [facility];  Service: General;  Laterality: N/A;   DILATION AND EVACUATION N/A 02/24/2023   Procedure: DILATATION AND EVACUATION;  Surgeon: [name], MD;  Location: [facility] OR;  Service: Gynecology;  Laterality: N/A;   SLEEVE GASTROPLASTY  10/12/2021   UPPER GASTROINTESTINAL ENDOSCOPY N/A       OB History     Gravida  6   Para  2   Term  2   Preterm      AB  3   Living  2      SAB  2   IAB  1   Ectopic      Multiple  0   Live Births  2               Current Medications: Current Meds  Medication Sig   terconazole  (TERAZOL 7 ) 0.4 % vaginal cream Place 1 applicator vaginally at bedtime.     Allergies:   Reglan  [metoclopramide ]   Social History   Socioeconomic History   Marital status: Single    Spouse name: Not on file   Number of children: 1   Years of education: 37   Highest education level: Not on file  Occupational History  Occupation: Consulting civil engineer   Occupation: CNA  Tobacco Use   Smoking status: Never   Smokeless tobacco: Never  Vaping Use   Vaping status: Never Used  Substance and Sexual Activity   Alcohol use: No   Drug use: No   Sexual activity: Yes    Partners: Male    Birth control/protection: None    Comment: last SI 08/18/23  Other Topics Concern   Not on file  Social History Narrative   Not on file   Social Drivers of Health   Financial Resource Strain: Low Risk  (07/29/2023)   Overall Financial Resource Strain (CARDIA)    Difficulty of Paying Living Expenses: Not very hard  Food Insecurity: No Food Insecurity (01/18/2024)   Hunger Vital Sign    Worried  About Running Out of Food in the Last Year: Never true    Ran Out of Food in the Last Year: Never true  Transportation Needs: No Transportation Needs (01/18/2024)   PRAPARE - Administrator, Civil Service (Medical): No    Lack of Transportation (Non-Medical): No  Physical Activity: Inactive (07/29/2023)   Exercise Vital Sign    Days of Exercise per Week: 0 days    Minutes of Exercise per Session: 0 min  Stress: Stress Concern Present (07/29/2023)   [employer] of Occupational Health - Occupational Stress Questionnaire    Feeling of Stress : To some extent  Social Connections: Socially Isolated (01/18/2024)   Social Connection and Isolation Panel    Frequency of Communication with Friends and Family: Three times a week    Frequency of Social Gatherings with Friends and Family: Twice a week    Attends Religious Services: Never    Database administrator or Organizations: No    Attends Engineer, structural: Never    Marital Status: Never married      Family History  Problem Relation Age of Onset   Diabetes Mother    Healthy Mother    Sickle cell trait Mother    Sickle cell trait Father    Rashes / Skin problems Father        eczema   Asthma Sister    Rashes / Skin problems Sister        eczema   Rashes / Skin problems Sister        eczema   Rashes / Skin problems Sister        eczema   Sickle cell anemia Sister    Rashes / Skin problems Sister        eczema   Autism Brother    Healthy Brother    Autism Daughter    Diabetes Maternal Grandmother    Hypertension Maternal Grandmother    Cancer Maternal Grandfather        prostate   Cancer Paternal Grandmother 84       lung   Healthy Paternal Grandfather    Ovarian cancer Neg Hx    Colon cancer Neg Hx    Breast cancer Neg Hx       ROS:   Please see the history of present illness.    Syncope  All other systems reviewed and are negative.   Labs/EKG Reviewed:    EKG:  Not today    Recent  Labs: 10/07/2023: Magnesium  1.7; TSH 1.129 12/25/2023: ALT 7; BUN 4; Creatinine, Ser 0.67; Potassium 4.0; Sodium 136 01/04/2024: Hemoglobin 9.1; Platelets 250   Recent Lipid Panel No results found for: CHOL, TRIG, HDL, CHOLHDL,  LDLCALC, LDLDIRECT  Physical Exam:    VS:  BP (!) 98/58 (BP Location: Right Arm, Patient Position: Sitting, Cuff Size: Normal)   Pulse (!) 107   Ht 5' 1 (1.549 m)   Wt 203 lb 3.2 oz (92.2 kg)   LMP  (Approximate) Comment: End of January  SpO2 99%   BMI 38.39 kg/m     Wt Readings from Last 3 Encounters:  01/21/24 203 lb 3.2 oz (92.2 kg)  01/20/24 205 lb 6.4 oz (93.2 kg)  01/18/24 217 lb 0.4 oz (98.4 kg)     GEN:  Well nourished, well developed in no acute distress HEENT: Normal NECK: No JVD; No carotid bruits LYMPHATICS: No lymphadenopathy CARDIAC: RRR, no murmurs, rubs, gallops RESPIRATORY:  Clear to auscultation without rales, wheezing or rhonchi  ABDOMEN: Soft, non-tender, non-distended MUSCULOSKELETAL:  No edema; No deformity  SKIN: Warm and dry NEUROLOGIC:  Alert and oriented x 3 PSYCHIATRIC:  Normal affect    Risk Assessment/Risk Calculators:                 ASSESSMENT & PLAN:    Vasovagal/orthostatic syncope in the presence of pregnancy  Frequent syncope and daily dizziness likely due to vasovagal effect. Symptoms expected to resolve post-delivery. Driving contraindicated due to syncope risk- I share with patient the per [place]  she should not drive for up to 6 months after a syncope episode.   Plan to attempt an ambulatory monitor again  with white tape to secure the device for at least three days. - Instruct on showering techniques to prevent monitor detachment.  Pregnancy, third trimester [redacted] weeks pregnant with syncope and dizziness attributed to physiological changes of pregnancy, expected to resolve after delivery. Concern for worsening symptoms in future pregnancies. - Plan follow-up 12 weeks postpartum,  virtually, to assess symptom resolution  Patient Instructions  Medication Instructions:  Your physician recommends that you continue on your current medications as directed. Please refer to the Current Medication list given to you today.  *If you need a refill on your cardiac medications before your next appointment, please call your pharmacy*   Testing/Procedures: ZIO AT Long term monitor-Live Telemetry  Your physician has requested you wear a ZIO patch monitor for 3 days.  This is a single patch monitor. [employer] supplies one patch monitor per enrollment. Additional  stickers are not available.  Please do not apply patch if you will be having a Nuclear Stress Test, Echocardiogram, Cardiac CT, MRI,  or Chest Xray during the period you would be wearing the monitor. The patch cannot be worn during  these tests. You cannot remove and re-apply the ZIO AT patch monitor.  Your ZIO patch monitor will be mailed 3 day USPS to your address on file. It may take 3-5 days to  receive your monitor after you have been enrolled.  Once you have received your monitor, please review the enclosed instructions. Your monitor has  already been registered assigning a specific monitor serial # to you.   Billing and Patient Assistance Program information  [place] has been supplied with any insurance information on record for billing. [employer] offers a sliding scale Patient Assistance Program for patients without insurance, or whose  insurance does not completely cover the cost of the ZIO patch monitor. You must apply for the  Patient Assistance Program to qualify for the discounted rate. To apply, call [employer] at [phone],  select option 4, select option 2 , ask to apply for the Patient Assistance Program, (you can  request an  interpreter if needed). [employer] will ask your household income and how many people are in your  household. [employer] will quote your out-of-pocket cost based on this information. They  will also be able  to set up a 12 month interest free payment plan if needed.  Applying the monitor   Shave hair from upper left chest.  Hold the abrader disc by orange tab. Rub the abrader in 40 strokes over left upper chest as indicated in  your monitor instructions.  Clean area with 4 enclosed alcohol pads. Use all pads to ensure the area is cleaned thoroughly. Let  dry.  Apply patch as indicated in monitor instructions. Patch will be placed under collarbone on left side of  chest with arrow pointing upward.  Rub patch adhesive wings for 2 minutes. Remove the white label marked 1. Remove the white label  marked 2. Rub patch adhesive wings for 2 additional minutes.  While looking in a mirror, press and release button in center of patch. A small green light will flash 3-4  times. This will be your only indicator that the monitor has been turned on.  Do not shower for the first 24 hours. You may shower after the first 24 hours.  Press the button if you feel a symptom. You will hear a small click. Record Date, Time and Symptom in  the Patient Log.   Starting the Gateway  In your kit there is a Audiological scientist box the size of a cellphone. This is Buyer, retail. It transmits all your  recorded data to [facility]. This box must always stay within 10 feet of you. Open the box and push the *  button. There will be a light that blinks orange and then green a few times. When the light stops  blinking, the Gateway is connected to the ZIO patch. Call [employer] at [phone] to confirm your monitor is transmitting.  Returning your monitor  Remove your patch and place it inside the Gateway. In the lower half of the Gateway there is a white  bag with prepaid postage on it. Place Gateway in bag and seal. Mail package back to [place] as soon as  possible. Your physician should have your final report approximately 7 days after you have mailed back  your monitor. Call [facility] Customer  Care at [phone] if you have questions regarding your ZIO AT  patch monitor. Call them immediately if you see an orange light blinking on your monitor.  If your monitor falls off in less than 4 days, contact our Monitor department at [phone]. If your  monitor becomes loose or falls off after 4 days call [employer] at [phone] for suggestions on  securing your monitor   Follow-Up: At [facility], you and your health needs are our priority.  As part of our continuing mission to provide you with exceptional heart care, our providers are all part of one team.  This team includes your primary Cardiologist (physician) and Advanced Practice Providers or APPs (Physician Assistants and Nurse Practitioners) who all work together to provide you with the care you need, when you need it.  Your next appointment:   12 week(s) after delivery via MyChart   Provider:   Sameer Teeple, DO            Dispo:  No follow-ups on file.   Medication Adjustments/Labs and Tests Ordered: Current medicines are reviewed at length with the patient today.  Concerns regarding medicines are outlined above.  Tests Ordered: Orders Placed This Encounter  Procedures   LONG TERM MONITOR-LIVE TELEMETRY (3-14 DAYS)   Medication Changes: No orders of the defined types were placed in this encounter.

## 2024-01-21 NOTE — Progress Notes (Signed)
 PRENATAL VISIT NOTE  Subjective:  Miranda Curtis is a 23 y.o. [id number] at [redacted]w[redacted]d being seen today for ongoing prenatal care.  She is currently monitored for the following issues for this low-risk pregnancy and has History of gastric bypass; Supervision of normal pregnancy; Anxiety and depression; ADHD; Short interval between pregnancies affecting pregnancy, antepartum; Maternal varicella, non-immune; Obesity affecting pregnancy, antepartum; Vasovagal syncope; B12 deficiency; History of bariatric surgery; COVID-19 affecting pregnancy in second trimester; Malabsorption; Trichomonal vaginitis during pregnancy in third trimester; and Iron  deficiency anemia during pregnancy on their problem list.  Patient reports another syncopal episode this week. Did not go to the MAU.  No bleeding.  Good FM.  Saw Dr. Sheena who feels she has pregnancy related vasovagal syncope and that this will completely resolve after delivery.  She suspects this will be a very quick resolution.  Have reached out to MFM for input regarding timing of delivery.  Contractions: Irregular. Vag. Bleeding: None.  Movement: Present (pt states that baby has not been moving as much today as she usually does). Denies leaking of fluid.   The following portions of the patient's history were reviewed and updated as appropriate: allergies, current medications, past family history, past medical history, past social history, past surgical history and problem list.   Objective:    Vitals:   01/21/24 1529  BP: 98/63  Pulse: 95  Weight: 202 lb 6.4 oz (91.8 kg)    Fetal Status:  Fetal Heart Rate (bpm): 137 Fundal Height: 36 cm Movement: Present (pt states that baby has not been moving as much today as she usually does) Presentation: Vertex  General: Alert, oriented and cooperative. Patient is in no acute distress.  Skin: Skin is warm and dry. No rash noted.   Cardiovascular: Normal heart rate noted  Respiratory: Normal respiratory effort, no  problems with respiration noted  Abdomen: Soft, gravid, appropriate for gestational age.  Pain/Pressure: Present (in lower pelvic area and lower back)     Pelvic: Cervical exam performed in the presence of a chaperone Dilation:  (2cm external os)      Extremities: Normal range of motion.  Edema: None  Mental Status: Normal mood and affect. Normal behavior. Normal judgment and thought content.   Assessment and Plan:  Pregnancy: H3E7967 at [redacted]w[redacted]d 1. Encounter for supervision of other normal pregnancy in third trimester (Primary) -  continue PNV - recheck 1 weeks  2. [redacted] weeks gestation of pregnancy  3. Vasovagal syncope - cardiac echo normal.  Pt does have cardiac monitor in place for the next week - Have received communication from Dr. Arna who recommends delivery between 37 - 38 weeks.  Will schedule induction and place orders.  4. Iron  deficiency anemia during pregnancy - has received IV iron , most recently Monday.  Most recent hb 9.1 on 9/27.  Additional infusions ordered  5. Short interval between pregnancies affecting pregnancy, antepartum  6. Trichomonal vaginitis during pregnancy in third trimester - treated with negative TOD  Preterm labor symptoms and general obstetric precautions including but not limited to vaginal bleeding, contractions, leaking of fluid and fetal movement were reviewed in detail with the patient. Please refer to After Visit Summary for other counseling recommendations.   Return in about 1 week (around 01/28/2024).  Future Appointments  Date Time Provider Department Center  01/27/2024 11:30 AM [facility]-CHAIR 2 [facility] None  01/27/2024  2:55 PM Lo, Arland POUR, CNM [facility] [id number]  01/29/2024 10:45 AM Gregg Lek, MD [facility] None  01/30/2024 11:30 AM  [facility]-CHAIR 8 [facility] None  02/03/2024 11:30 AM [facility]-CHAIR 7 [facility] None  02/04/2024  7:00 AM [facility]-LD SCHED ROOM [facility] None  02/05/2024  9:15 AM Lo, Arland POUR, CNM [facility] [id number]  02/13/2024   9:15 AM Delores Nidia CROME, FNP [facility] [id number]  02/20/2024 10:15 AM Delores Nidia CROME, FNP [facility] [id number]  04/23/2024 11:20 AM Tobb, Dub, DO [facility]    Ronal GORMAN Pinal, MD

## 2024-01-21 NOTE — Patient Instructions (Signed)
 Medication Instructions:  Your physician recommends that you continue on your current medications as directed. Please refer to the Current Medication list given to you today.  *If you need a refill on your cardiac medications before your next appointment, please call your pharmacy*   Testing/Procedures: ZIO AT Long term monitor-Live Telemetry  Your physician has requested you wear a ZIO patch monitor for 3 days.  This is a single patch monitor. [employer] supplies one patch monitor per enrollment. Additional  stickers are not available.  Please do not apply patch if you will be having a Nuclear Stress Test, Echocardiogram, Cardiac CT, MRI,  or Chest Xray during the period you would be wearing the monitor. The patch cannot be worn during  these tests. You cannot remove and re-apply the ZIO AT patch monitor.  Your ZIO patch monitor will be mailed 3 day USPS to your address on file. It may take 3-5 days to  receive your monitor after you have been enrolled.  Once you have received your monitor, please review the enclosed instructions. Your monitor has  already been registered assigning a specific monitor serial # to you.   Billing and Patient Assistance Program information  Meredeth has been supplied with any insurance information on record for billing. [employer] offers a sliding scale Patient Assistance Program for patients without insurance, or whose  insurance does not completely cover the cost of the ZIO patch monitor. You must apply for the  Patient Assistance Program to qualify for the discounted rate. To apply, call [employer] at [phone],  select option 4, select option 2 , ask to apply for the Patient Assistance Program, (you can request an  interpreter if needed). [employer] will ask your household income and how many people are in your  household. [employer] will quote your out-of-pocket cost based on this information. They will also be able  to set up a 12 month interest free payment plan  if needed.  Applying the monitor   Shave hair from upper left chest.  Hold the abrader disc by orange tab. Rub the abrader in 40 strokes over left upper chest as indicated in  your monitor instructions.  Clean area with 4 enclosed alcohol pads. Use all pads to ensure the area is cleaned thoroughly. Let  dry.  Apply patch as indicated in monitor instructions. Patch will be placed under collarbone on left side of  chest with arrow pointing upward.  Rub patch adhesive wings for 2 minutes. Remove the white label marked 1. Remove the white label  marked 2. Rub patch adhesive wings for 2 additional minutes.  While looking in a mirror, press and release button in center of patch. A small green light will flash 3-4  times. This will be your only indicator that the monitor has been turned on.  Do not shower for the first 24 hours. You may shower after the first 24 hours.  Press the button if you feel a symptom. You will hear a small click. Record Date, Time and Symptom in  the Patient Log.   Starting the Gateway  In your kit there is a Audiological scientist box the size of a cellphone. This is Buyer, retail. It transmits all your  recorded data to [facility]. This box must always stay within 10 feet of you. Open the box and push the *  button. There will be a light that blinks orange and then green a few times. When the light stops  blinking, the Gateway is connected to the  ZIO patch. Call [employer] at [phone] to confirm your monitor is transmitting.  Returning your monitor  Remove your patch and place it inside the Gateway. In the lower half of the Gateway there is a white  bag with prepaid postage on it. Place Gateway in bag and seal. Mail package back to [place] as soon as  possible. Your physician should have your final report approximately 7 days after you have mailed back  your monitor. Call [facility] Customer Care at [phone] if you have questions regarding your ZIO AT   patch monitor. Call them immediately if you see an orange light blinking on your monitor.  If your monitor falls off in less than 4 days, contact our Monitor department at [phone]. If your  monitor becomes loose or falls off after 4 days call [employer] at [phone] for suggestions on  securing your monitor   Follow-Up: At [facility], you and your health needs are our priority.  As part of our continuing mission to provide you with exceptional heart care, our providers are all part of one team.  This team includes your primary Cardiologist (physician) and Advanced Practice Providers or APPs (Physician Assistants and Nurse Practitioners) who all work together to provide you with the care you need, when you need it.  Your next appointment:   12 week(s) after delivery via MyChart   Provider:   Kardie Tobb, DO

## 2024-01-21 NOTE — Progress Notes (Unsigned)
 ZIO AT serial # [id number] from office inventory applied.

## 2024-01-22 ENCOUNTER — Encounter (HOSPITAL_BASED_OUTPATIENT_CLINIC_OR_DEPARTMENT_OTHER): Payer: Self-pay | Admitting: Certified Nurse Midwife

## 2024-01-22 ENCOUNTER — Other Ambulatory Visit (HOSPITAL_COMMUNITY): Payer: Self-pay | Admitting: Obstetrics & Gynecology

## 2024-01-22 ENCOUNTER — Encounter (HOSPITAL_BASED_OUTPATIENT_CLINIC_OR_DEPARTMENT_OTHER): Payer: Self-pay | Admitting: Obstetrics & Gynecology

## 2024-01-22 ENCOUNTER — Ambulatory Visit: Payer: MEDICAID | Admitting: Cardiology

## 2024-01-23 ENCOUNTER — Telehealth (HOSPITAL_COMMUNITY): Payer: Self-pay

## 2024-01-23 ENCOUNTER — Other Ambulatory Visit (HOSPITAL_BASED_OUTPATIENT_CLINIC_OR_DEPARTMENT_OTHER): Payer: Self-pay | Admitting: Obstetrics & Gynecology

## 2024-01-23 DIAGNOSIS — Z3490 Encounter for supervision of normal pregnancy, unspecified, unspecified trimester: Secondary | ICD-10-CM

## 2024-01-23 DIAGNOSIS — D509 Iron deficiency anemia, unspecified: Secondary | ICD-10-CM

## 2024-01-23 NOTE — Telephone Encounter (Addendum)
 Auth Submission: NO AUTH NEEDED Site of care: [facility] Payer: [employer] Medication & CPT/J Code(s) submitted: Venofer  (Iron  Sucrose) J1756 Diagnosis Code: D50.9, O99.019 Route of submission (phone, fax, portal):  Phone # Fax # Auth type: Buy/Bill PB Units/visits requested: 200mg  x 5 doses Reference number:  Approval from: 01/23/24 to 04/08/24

## 2024-01-24 ENCOUNTER — Ambulatory Visit: Payer: MEDICAID

## 2024-01-24 VITALS — BP 110/71 | HR 98 | Temp 98.3°F | Resp 16 | Ht 61.0 in | Wt 203.0 lb

## 2024-01-24 DIAGNOSIS — Z3A36 36 weeks gestation of pregnancy: Secondary | ICD-10-CM

## 2024-01-24 DIAGNOSIS — D509 Iron deficiency anemia, unspecified: Secondary | ICD-10-CM | POA: Diagnosis not present

## 2024-01-24 DIAGNOSIS — O99013 Anemia complicating pregnancy, third trimester: Secondary | ICD-10-CM

## 2024-01-24 MED ORDER — IRON SUCROSE 20 MG/ML IV SOLN
200.0000 mg | Freq: Once | INTRAVENOUS | Status: AC
  Administered 2024-01-24: 200 mg via INTRAVENOUS
  Filled 2024-01-24: qty 10

## 2024-01-24 MED ORDER — SODIUM CHLORIDE 0.9 % IV BOLUS
250.0000 mL | Freq: Once | INTRAVENOUS | Status: AC
  Administered 2024-01-24: 250 mL via INTRAVENOUS
  Filled 2024-01-24: qty 250

## 2024-01-24 NOTE — Progress Notes (Signed)
 Diagnosis: Iron  Deficiency Anemia  Provider:  Praveen Mannam MD  Procedure: IV Push  IV Type: Peripheral, IV Location: R Antecubital  Venofer (Iron  Sucrose), Dose: 200 mg  Post Infusion IV Care: Observation period completed  Discharge: Condition: Good, Destination: Home . AVS Declined  Performed by:  Rachelle Bue, RN

## 2024-01-25 ENCOUNTER — Encounter (HOSPITAL_BASED_OUTPATIENT_CLINIC_OR_DEPARTMENT_OTHER): Payer: Self-pay | Admitting: Obstetrics & Gynecology

## 2024-01-27 ENCOUNTER — Encounter (HOSPITAL_COMMUNITY): Payer: Self-pay | Admitting: *Deleted

## 2024-01-27 ENCOUNTER — Ambulatory Visit: Payer: MEDICAID | Admitting: *Deleted

## 2024-01-27 ENCOUNTER — Telehealth (HOSPITAL_COMMUNITY): Payer: Self-pay | Admitting: *Deleted

## 2024-01-27 ENCOUNTER — Ambulatory Visit (INDEPENDENT_AMBULATORY_CARE_PROVIDER_SITE_OTHER): Payer: MEDICAID | Admitting: Certified Nurse Midwife

## 2024-01-27 VITALS — BP 106/73 | HR 108 | Temp 98.0°F | Resp 16 | Ht 61.0 in | Wt 203.2 lb

## 2024-01-27 VITALS — BP 107/77 | HR 88 | Wt 203.0 lb

## 2024-01-27 DIAGNOSIS — D509 Iron deficiency anemia, unspecified: Secondary | ICD-10-CM

## 2024-01-27 DIAGNOSIS — O26893 Other specified pregnancy related conditions, third trimester: Secondary | ICD-10-CM | POA: Diagnosis not present

## 2024-01-27 DIAGNOSIS — Z3A37 37 weeks gestation of pregnancy: Secondary | ICD-10-CM | POA: Diagnosis not present

## 2024-01-27 DIAGNOSIS — Z3A36 36 weeks gestation of pregnancy: Secondary | ICD-10-CM

## 2024-01-27 DIAGNOSIS — R55 Syncope and collapse: Secondary | ICD-10-CM

## 2024-01-27 DIAGNOSIS — O99013 Anemia complicating pregnancy, third trimester: Secondary | ICD-10-CM

## 2024-01-27 DIAGNOSIS — Z3483 Encounter for supervision of other normal pregnancy, third trimester: Secondary | ICD-10-CM

## 2024-01-27 DIAGNOSIS — O99019 Anemia complicating pregnancy, unspecified trimester: Secondary | ICD-10-CM

## 2024-01-27 MED ORDER — SODIUM CHLORIDE 0.9 % IV BOLUS
250.0000 mL | Freq: Once | INTRAVENOUS | Status: AC
  Administered 2024-01-27: 250 mL via INTRAVENOUS
  Filled 2024-01-27: qty 250

## 2024-01-27 MED ORDER — IRON SUCROSE 20 MG/ML IV SOLN
200.0000 mg | Freq: Once | INTRAVENOUS | Status: AC
  Administered 2024-01-27: 200 mg via INTRAVENOUS
  Filled 2024-01-27: qty 10

## 2024-01-27 NOTE — Progress Notes (Signed)
 Diagnosis: Iron  Deficiency Anemia  Provider:  Mannam, Praveen MD  Procedure: IV Push  IV Type: Peripheral, IV Location: R Antecubital  Venofer  (Iron  Sucrose), Dose: 200 mg Given with 250 cc Normal Saline  Post Infusion IV Care: Observation period completed Peripheral IV out  Discharge: Condition: Good, Destination: Home . AVS Declined  Performed by:  Trudy Lamarr LABOR, RN

## 2024-01-27 NOTE — Progress Notes (Signed)
   PRENATAL VISIT NOTE  Subjective:  Miranda Curtis is a 23 y.o. [id number] at [redacted]w[redacted]d being seen today for ongoing prenatal care.  She is currently monitored for the following issues for this  pregnancy and has History of gastric bypass; Supervision of normal pregnancy; Anxiety and depression; ADHD; Short interval between pregnancies affecting pregnancy, antepartum; Maternal varicella, non-immune; Obesity affecting pregnancy, antepartum; Vasovagal syncope; B12 deficiency; History of bariatric surgery; COVID-19 affecting pregnancy in second trimester; Malabsorption; Trichomonal vaginitis during pregnancy in third trimester; and Iron  deficiency anemia during pregnancy on their problem list.  Patient reports no bleeding, no contractions, no cramping, and no leaking.  Contractions: Irregular. Vag. Bleeding: None.  Movement: Present. Denies leaking of fluid.   The following portions of the patient's history were reviewed and updated as appropriate: allergies, current medications, past family history, past medical history, past social history, past surgical history and problem list.   Objective:    Vitals:   01/27/24 1449  BP: 107/77  Pulse: 88  Weight: 203 lb (92.1 kg)    Fetal Status:  Fetal Heart Rate (bpm): 140 Fundal Height: 36 cm Movement: Present    General: Alert, oriented and cooperative. Patient is in no acute distress.  Skin: Skin is warm and dry. No rash noted.   Cardiovascular: Normal heart rate noted  Respiratory: Normal respiratory effort, no problems with respiration noted  Abdomen: Soft, gravid, appropriate for gestational age.  Pain/Pressure: Present (pelvic pressure)     Pelvic: Cervical exam performed in the presence of a chaperone        Extremities: Normal range of motion.  Edema: None  Mental Status: Normal mood and affect. Normal behavior. Normal judgment and thought content.   Assessment and Plan:  Pregnancy: [id number] at [redacted]w[redacted]d  1. Encounter for supervision of other  normal pregnancy in third trimester (Primary) -  continue PNV   2. [redacted] weeks gestation of pregnancy - Pt verbalizes understanding that induction at 37 weeks has risk of baby requiring NICU stay - GBS Negative, GC/CT Negative   3. Vasovagal syncope - cardiac echo normal.  Pt does have cardiac monitor in place for the next week - Dr. Cleotilde  received communication from Dr. Arna who recommends delivery between 37 - 38 weeks.  Dr. Cleotilde scheduled IOL and placed orders.   4. Iron  deficiency anemia during pregnancy - has received IV iron , most recently today   5. Short interval between pregnancies affecting pregnancy, antepartum   6. Trichomonal vaginitis during pregnancy in third trimester - Negative test of cure.  Preterm labor symptoms and general obstetric precautions including but not limited to vaginal bleeding, contractions, leaking of fluid and fetal movement were reviewed in detail with the patient. Please refer to After Visit Summary for other counseling recommendations.    Future Appointments  Date Time Provider Department Center  01/29/2024 10:45 AM Gregg Lek, MD [facility] None  01/30/2024 11:30 AM [facility] None  02/03/2024 11:30 AM [facility] 7 [facility] None  02/04/2024  7:00 AM [facility] SCHED ROOM [facility] None  02/05/2024  9:15 AM Cohen Doleman, Arland POUR, CNM [facility] [address]  02/13/2024  9:15 AM Delores Nidia CROME, FNP [facility] [address]  02/20/2024 10:15 AM Delores Nidia CROME, FNP [facility] [address]  04/23/2024 11:20 AM Tobb, Dub, DO [facility]    Arland POUR Roller, CNM

## 2024-01-27 NOTE — Telephone Encounter (Signed)
 Preadmission screen

## 2024-01-29 ENCOUNTER — Ambulatory Visit: Payer: MEDICAID | Admitting: Neurology

## 2024-01-29 ENCOUNTER — Encounter (HOSPITAL_BASED_OUTPATIENT_CLINIC_OR_DEPARTMENT_OTHER): Payer: Self-pay | Admitting: Obstetrics & Gynecology

## 2024-01-30 ENCOUNTER — Ambulatory Visit: Payer: MEDICAID

## 2024-01-30 MED ORDER — IRON SUCROSE 20 MG/ML IV SOLN: 200.0000 mg | Freq: Once | INTRAVENOUS | Status: DC

## 2024-01-30 MED ORDER — SODIUM CHLORIDE 0.9 % IV BOLUS
250.0000 mL | Freq: Once | INTRAVENOUS | Status: DC
  Filled 2024-01-30: qty 250

## 2024-01-31 ENCOUNTER — Ambulatory Visit: Payer: MEDICAID

## 2024-01-31 VITALS — BP 110/74 | HR 95 | Temp 97.8°F | Resp 16 | Ht 61.0 in | Wt 204.6 lb

## 2024-01-31 DIAGNOSIS — D509 Iron deficiency anemia, unspecified: Secondary | ICD-10-CM | POA: Diagnosis not present

## 2024-01-31 DIAGNOSIS — O99013 Anemia complicating pregnancy, third trimester: Secondary | ICD-10-CM | POA: Diagnosis not present

## 2024-01-31 DIAGNOSIS — Z3A37 37 weeks gestation of pregnancy: Secondary | ICD-10-CM | POA: Diagnosis not present

## 2024-01-31 MED ORDER — IRON SUCROSE 20 MG/ML IV SOLN
200.0000 mg | Freq: Once | INTRAVENOUS | Status: AC
  Administered 2024-01-31: 200 mg via INTRAVENOUS
  Filled 2024-01-31: qty 10

## 2024-01-31 MED ORDER — SODIUM CHLORIDE 0.9 % IV BOLUS
250.0000 mL | Freq: Once | INTRAVENOUS | Status: DC
  Filled 2024-01-31: qty 250

## 2024-01-31 NOTE — Consults (Addendum)
 Rueben DELENA Finder is referred to the Preoperative Anemia Clinic by the [facility] provider for evaluation and treatment of anemia.  Ms.Worster is allergic to 2-octyl cyanoacrylate and metoclopramide .   She has a current medication list which includes the following prescription(s): anti-itch(diphenhyd) with zinc , gabapentin, loratadine, multivitamin, and ventolin hfa. Patient Active Problem List  Diagnosis  . ADHD (attention deficit hyperactivity disorder), combined type  . History of depression  . Irregular periods  . Class 2 obesity  . GERD without esophagitis  . Morbid obesity with BMI of 45.0-49.9, adult (CMS-HCC)  . Dyspnea  . Atypical chest pain  . Type 2 diabetes mellitus (CMS/HHS-HCC)  . Morbid obesity (CMS-HCC)  . S/P laparoscopic sleeve gastrectomy    PAC LAB RESULTS: Lab Results  Component Value Date   WBC 7.4 12/15/2023   HGB 9.5 (L) 12/15/2023   HCT 30.6 (L) 12/15/2023   MCV 68 (L) 12/15/2023   PLT 276 12/15/2023   CREATININE 0.6 12/15/2023   GFR 128 12/15/2023   FOLATE 10.0 05/31/2021   IRON  17 (L) 12/15/2023   TIBC 604 (H) 12/15/2023   PCTSAT 3 (L) 12/15/2023   FERRITIN 5 (L) 12/15/2023   VITB12 161 05/31/2021   HEP  12/28/2014    Migrates as hemoglobins A:F:A2.  There is an increase in hemoglobin F.  The differential diagnosis is hereditary persistence of fetal hemoglobin versus delta-beta thalassemia trait.  Iron  studies are recommended to aid in the distinction.  Interpretation assumes a non-transfused sample.  I have personally performed this interpretation.  Chad M. McCall, MD, PhD Assistant Professor of Pathology Director, Protein Electrophoresis Laboratory 12/30/2014     Assessment: low B12, iron  deficiency anemia  We have been unable to reach Ms. Holtrop to discuss treatment. No future appointments at [facility]. Please contact the [facility] staff for questions or concerns at [phone] (or [phone] for urgent/emergent needs)  Gray Nyhan, MSN, RN, FNP-C [facility] [phone] 01/31/2024  I have reviewed the chart and agree with the above written assessment and plan, including, when indicated, plan for optimization of hemoglobin.  Reena FREDRIK Fent, MD Medical Director, [facility]

## 2024-01-31 NOTE — Progress Notes (Signed)
 Diagnosis: Iron  Deficiency Anemia  Provider:  Praveen Mannam MD  Procedure: IV Push  IV Type: Peripheral, IV Location: R Antecubital  Venofer  (Iron  Sucrose), Dose: 200 mg  Post Infusion IV Care: Observation period completed and Peripheral IV Discontinued  Discharge: Condition: Good, Destination: Home . AVS Declined  Performed by:  Niamh Rada G [place], RN

## 2024-02-02 ENCOUNTER — Other Ambulatory Visit: Payer: Self-pay

## 2024-02-02 ENCOUNTER — Inpatient Hospital Stay (EMERGENCY_DEPARTMENT_HOSPITAL)
Admission: AD | Admit: 2024-02-02 | Discharge: 2024-02-02 | Disposition: A | Discharge status: Home / Self Care | Payer: MEDICAID | Source: Home / Self Care | Attending: Obstetrics and Gynecology | Admitting: Obstetrics and Gynecology

## 2024-02-02 ENCOUNTER — Encounter (HOSPITAL_COMMUNITY): Payer: Self-pay | Admitting: Obstetrics and Gynecology

## 2024-02-02 DIAGNOSIS — O471 False labor at or after 37 completed weeks of gestation: Secondary | ICD-10-CM | POA: Insufficient documentation

## 2024-02-02 DIAGNOSIS — O479 False labor, unspecified: Secondary | ICD-10-CM

## 2024-02-02 DIAGNOSIS — Z3A37 37 weeks gestation of pregnancy: Secondary | ICD-10-CM | POA: Diagnosis not present

## 2024-02-02 DIAGNOSIS — M7918 Myalgia, other site: Secondary | ICD-10-CM

## 2024-02-02 LAB — URINALYSIS, ROUTINE W REFLEX MICROSCOPIC
Bilirubin Urine: NEGATIVE
Glucose, UA: NEGATIVE mg/dL
Hgb urine dipstick: NEGATIVE
Ketones, ur: 5 mg/dL — AB
Nitrite: NEGATIVE
Protein, ur: NEGATIVE mg/dL
Specific Gravity, Urine: 1.017 (ref 1.005–1.030)
pH: 7 (ref 5.0–8.0)

## 2024-02-02 MED ORDER — CYCLOBENZAPRINE HCL 10 MG PO TABS: 10.0000 mg | ORAL_TABLET | Freq: Three times a day (TID) | ORAL | 0 refills | Status: DC | PRN

## 2024-02-02 NOTE — MAU Provider Note (Signed)
 None     S [name] is a 23 y.o. [phone] patient who presents to MAU today with complaint of contractions and requesting medication for pelvic pain per RN. Short interval pregnancy.   O BP 115/75   Pulse 100   Temp 98.2 F (36.8 C) (Oral)   Resp 18   LMP  (Approximate) Comment: End of January  SpO2 99%  S: [name] is a 23 y.o. [phone] at [redacted]w[redacted]d  who presents to MAU today for labor evaluation.     Cervical exam by RN:  Dilation: 1 Effacement (%): 10 Cervical Position: Posterior Station: Ballotable Exam by:: CANDIE Alas, RN  Fetal Monitoring: Baseline: 140 Variability: mod Accelerations: 15x15 Decelerations: none Contractions: Irreg  MDM Discussed patient with RN. NST reviewed.   A: SIUP at [redacted]w[redacted]d  False labor  P: Discharge home Labor precautions and kick counts included in AVS Patient to follow-up with [facility] as scheduled  Patient may return to MAU as needed or when in labor  Flexeril  for MS pain.   Claudene Kemar Pandit , CNM 02/02/2024 4:12 PM

## 2024-02-02 NOTE — MAU Note (Signed)
 Miranda Curtis is a 23 y.o. at [redacted]w[redacted]d here in MAU reporting: contractions since yesterday AM. Feels them about 5 times per hour. Painful, radiates to back. (7/10). Tried Tylenol - not sure how much- at 1100 today with no relief. Feels like baby is going to come out when she stands. Baby is moving well. Denies VB or LOF. No other complaints. Scheduled for IOL on 02/04/24.    Onset of complaint: 02/01/24 Pain score: 7/10 Vitals:   02/02/24 1549  BP: 117/82  Pulse: 93  Resp: 18  Temp: 98.2 F (36.8 C)  SpO2: 100%     FHT: 150s  Lab orders placed from triage: UA

## 2024-02-03 ENCOUNTER — Ambulatory Visit: Payer: MEDICAID

## 2024-02-03 ENCOUNTER — Other Ambulatory Visit (HOSPITAL_BASED_OUTPATIENT_CLINIC_OR_DEPARTMENT_OTHER): Payer: Self-pay | Admitting: Obstetrics & Gynecology

## 2024-02-03 VITALS — BP 102/66 | HR 104 | Temp 98.4°F | Resp 18 | Ht 61.0 in | Wt 206.0 lb

## 2024-02-03 DIAGNOSIS — O99019 Anemia complicating pregnancy, unspecified trimester: Secondary | ICD-10-CM | POA: Diagnosis not present

## 2024-02-03 DIAGNOSIS — Z3A37 37 weeks gestation of pregnancy: Secondary | ICD-10-CM | POA: Diagnosis not present

## 2024-02-03 DIAGNOSIS — D509 Iron deficiency anemia, unspecified: Secondary | ICD-10-CM | POA: Diagnosis not present

## 2024-02-03 MED ORDER — IRON SUCROSE 20 MG/ML IV SOLN
200.0000 mg | Freq: Once | INTRAVENOUS | Status: AC
  Administered 2024-02-03: 200 mg via INTRAVENOUS
  Filled 2024-02-03: qty 10

## 2024-02-03 MED ORDER — SODIUM CHLORIDE 0.9 % IV BOLUS
250.0000 mL | Freq: Once | INTRAVENOUS | Status: AC
  Administered 2024-02-03: 250 mL via INTRAVENOUS
  Filled 2024-02-03: qty 250

## 2024-02-03 NOTE — Progress Notes (Signed)
 Diagnosis: Iron  Deficiency Anemia  Provider:  Praveen Mannam MD  Procedure: IV Push  IV Type: Peripheral, IV Location: R Antecubital  Venofer  (Iron  Sucrose), Dose: 200 mg  Post Infusion IV Care: Observation period completed Patient requested 10 minute observation  Discharge: Condition: Good, Destination: Home . AVS Declined  Performed by:  Eleanor DELENA Bloch, RN

## 2024-02-04 ENCOUNTER — Encounter (HOSPITAL_COMMUNITY): Payer: Self-pay | Admitting: Obstetrics & Gynecology

## 2024-02-04 ENCOUNTER — Inpatient Hospital Stay (HOSPITAL_COMMUNITY)
Admission: AD | Admit: 2024-02-04 | Discharge: 2024-02-06 | DRG: 807 | Disposition: A | Discharge status: Home / Self Care | Payer: MEDICAID | Attending: Obstetrics and Gynecology | Admitting: Obstetrics and Gynecology

## 2024-02-04 ENCOUNTER — Inpatient Hospital Stay (HOSPITAL_COMMUNITY): Payer: MEDICAID

## 2024-02-04 ENCOUNTER — Encounter: Payer: Self-pay | Admitting: Obstetrics & Gynecology

## 2024-02-04 ENCOUNTER — Inpatient Hospital Stay (HOSPITAL_COMMUNITY): Payer: MEDICAID | Admitting: Anesthesiology

## 2024-02-04 ENCOUNTER — Other Ambulatory Visit: Payer: Self-pay

## 2024-02-04 DIAGNOSIS — F32A Depression, unspecified: Secondary | ICD-10-CM | POA: Diagnosis present

## 2024-02-04 DIAGNOSIS — O9902 Anemia complicating childbirth: Secondary | ICD-10-CM | POA: Diagnosis present

## 2024-02-04 DIAGNOSIS — R296 Repeated falls: Secondary | ICD-10-CM | POA: Diagnosis present

## 2024-02-04 DIAGNOSIS — Z3490 Encounter for supervision of normal pregnancy, unspecified, unspecified trimester: Secondary | ICD-10-CM

## 2024-02-04 DIAGNOSIS — Z3A37 37 weeks gestation of pregnancy: Secondary | ICD-10-CM | POA: Diagnosis not present

## 2024-02-04 DIAGNOSIS — O99844 Bariatric surgery status complicating childbirth: Secondary | ICD-10-CM | POA: Diagnosis present

## 2024-02-04 DIAGNOSIS — Z833 Family history of diabetes mellitus: Secondary | ICD-10-CM | POA: Diagnosis not present

## 2024-02-04 DIAGNOSIS — R55 Syncope and collapse: Principal | ICD-10-CM | POA: Diagnosis present

## 2024-02-04 DIAGNOSIS — Z8249 Family history of ischemic heart disease and other diseases of the circulatory system: Secondary | ICD-10-CM

## 2024-02-04 DIAGNOSIS — O99892 Other specified diseases and conditions complicating childbirth: Secondary | ICD-10-CM | POA: Diagnosis present

## 2024-02-04 DIAGNOSIS — Z9884 Bariatric surgery status: Secondary | ICD-10-CM

## 2024-02-04 DIAGNOSIS — Z8616 Personal history of COVID-19: Secondary | ICD-10-CM

## 2024-02-04 DIAGNOSIS — O9832 Other infections with a predominantly sexual mode of transmission complicating childbirth: Secondary | ICD-10-CM | POA: Diagnosis not present

## 2024-02-04 DIAGNOSIS — D509 Iron deficiency anemia, unspecified: Secondary | ICD-10-CM | POA: Diagnosis present

## 2024-02-04 DIAGNOSIS — O99344 Other mental disorders complicating childbirth: Secondary | ICD-10-CM | POA: Diagnosis not present

## 2024-02-04 LAB — CBC
HCT: 34.7 % — ABNORMAL LOW (ref 36.0–46.0)
Hemoglobin: 11.6 g/dL — ABNORMAL LOW (ref 12.0–15.0)
MCH: 24.1 pg — ABNORMAL LOW (ref 26.0–34.0)
MCHC: 33.4 g/dL (ref 30.0–36.0)
MCV: 72.1 fL — ABNORMAL LOW (ref 80.0–100.0)
Platelets: 193 K/uL (ref 150–400)
RBC: 4.81 MIL/uL (ref 3.87–5.11)
RDW: 28.4 % — ABNORMAL HIGH (ref 11.5–15.5)
WBC: 6.4 K/uL (ref 4.0–10.5)
nRBC: 0 % (ref 0.0–0.2)

## 2024-02-04 LAB — TYPE AND SCREEN
ABO/RH(D): AB POS
Antibody Screen: NEGATIVE

## 2024-02-04 LAB — RPR: RPR Ser Ql: NONREACTIVE

## 2024-02-04 MED ORDER — FLEET ENEMA RE ENEM: 1.0000 | ENEMA | RECTAL | Status: DC | PRN

## 2024-02-04 MED ORDER — ONDANSETRON HCL 4 MG/2ML IJ SOLN: 4.0000 mg | INTRAMUSCULAR | Status: DC | PRN

## 2024-02-04 MED ORDER — ONDANSETRON HCL 4 MG/2ML IJ SOLN
4.0000 mg | Freq: Four times a day (QID) | INTRAMUSCULAR | Status: DC | PRN
  Administered 2024-02-04: 4 mg via INTRAVENOUS
  Filled 2024-02-04: qty 2

## 2024-02-04 MED ORDER — PHENYLEPHRINE 80 MCG/ML (10ML) SYRINGE FOR IV PUSH (FOR BLOOD PRESSURE SUPPORT): 80.0000 ug | PREFILLED_SYRINGE | INTRAVENOUS | Status: DC | PRN

## 2024-02-04 MED ORDER — LIDOCAINE HCL (PF) 1 % IJ SOLN
INTRAMUSCULAR | Status: DC | PRN
Start: 2024-02-04 — End: 2024-02-04
  Administered 2024-02-04: 5 mL via EPIDURAL

## 2024-02-04 MED ORDER — FENTANYL-BUPIVACAINE-NACL 0.5-0.125-0.9 MG/250ML-% EP SOLN
12.0000 mL/h | EPIDURAL | Status: DC | PRN
  Administered 2024-02-04: 12 mL/h via EPIDURAL
  Filled 2024-02-04: qty 250

## 2024-02-04 MED ORDER — EPHEDRINE 5 MG/ML INJ: 10.0000 mg | INTRAVENOUS | Status: DC | PRN

## 2024-02-04 MED ORDER — DIPHENHYDRAMINE HCL 25 MG PO CAPS: 25.0000 mg | ORAL_CAPSULE | Freq: Four times a day (QID) | ORAL | Status: DC | PRN

## 2024-02-04 MED ORDER — OXYTOCIN-SODIUM CHLORIDE 30-0.9 UT/500ML-% IV SOLN
2.5000 [IU]/h | INTRAVENOUS | Status: DC
Start: 2024-02-04 — End: 2024-02-04

## 2024-02-04 MED ORDER — TETANUS-DIPHTH-ACELL PERTUSSIS 5-2-15.5 LF-MCG/0.5 IM SUSP: 0.5000 mL | Freq: Once | INTRAMUSCULAR | Status: DC

## 2024-02-04 MED ORDER — ONDANSETRON HCL 4 MG PO TABS: 4.0000 mg | ORAL_TABLET | ORAL | Status: DC | PRN

## 2024-02-04 MED ORDER — SIMETHICONE 80 MG PO CHEW: 80.0000 mg | CHEWABLE_TABLET | ORAL | Status: DC | PRN

## 2024-02-04 MED ORDER — EPHEDRINE 5 MG/ML INJ
10.0000 mg | INTRAVENOUS | Status: DC | PRN
  Filled 2024-02-04: qty 5

## 2024-02-04 MED ORDER — WITCH HAZEL-GLYCERIN EX PADS: 1.0000 | MEDICATED_PAD | CUTANEOUS | Status: DC | PRN

## 2024-02-04 MED ORDER — COCONUT OIL OIL: 1.0000 | TOPICAL_OIL | Status: DC | PRN

## 2024-02-04 MED ORDER — DIPHENHYDRAMINE HCL 50 MG/ML IJ SOLN: 12.5000 mg | INTRAMUSCULAR | Status: DC | PRN

## 2024-02-04 MED ORDER — PRENATAL MULTIVITAMIN CH
1.0000 | ORAL_TABLET | Freq: Every day | ORAL | Status: DC
  Filled 2024-02-04 (×2): qty 1

## 2024-02-04 MED ORDER — OXYCODONE-ACETAMINOPHEN 5-325 MG PO TABS: 2.0000 | ORAL_TABLET | ORAL | Status: DC | PRN

## 2024-02-04 MED ORDER — BENZOCAINE-MENTHOL 20-0.5 % EX AERO
1.0000 | INHALATION_SPRAY | CUTANEOUS | Status: DC | PRN
  Administered 2024-02-06: 1 via TOPICAL
  Filled 2024-02-04: qty 56

## 2024-02-04 MED ORDER — ACETAMINOPHEN 325 MG PO TABS
650.0000 mg | ORAL_TABLET | ORAL | Status: DC | PRN
  Administered 2024-02-05: 650 mg via ORAL
  Filled 2024-02-04: qty 2

## 2024-02-04 MED ORDER — LACTATED RINGERS IV SOLN: 500.0000 mL | INTRAVENOUS | Status: DC | PRN

## 2024-02-04 MED ORDER — IBUPROFEN 600 MG PO TABS
600.0000 mg | ORAL_TABLET | Freq: Four times a day (QID) | ORAL | Status: DC
  Filled 2024-02-04: qty 1

## 2024-02-04 MED ORDER — TERBUTALINE SULFATE 1 MG/ML IJ SOLN: 0.2500 mg | Freq: Once | INTRAMUSCULAR | Status: DC | PRN

## 2024-02-04 MED ORDER — SOD CITRATE-CITRIC ACID 500-334 MG/5ML PO SOLN
30.0000 mL | ORAL | Status: DC | PRN
Start: 2024-02-04 — End: 2024-02-04

## 2024-02-04 MED ORDER — MISOPROSTOL 25 MCG QUARTER TABLET
25.0000 ug | ORAL_TABLET | Freq: Once | ORAL | Status: AC
Start: 2024-02-04 — End: 2024-02-04
  Administered 2024-02-04: 25 ug via VAGINAL
  Filled 2024-02-04: qty 1

## 2024-02-04 MED ORDER — SENNOSIDES-DOCUSATE SODIUM 8.6-50 MG PO TABS
2.0000 | ORAL_TABLET | Freq: Every day | ORAL | Status: DC
  Administered 2024-02-06: 2 via ORAL
  Filled 2024-02-04 (×2): qty 2

## 2024-02-04 MED ORDER — TERBUTALINE SULFATE 1 MG/ML IJ SOLN
0.2500 mg | Freq: Once | INTRAMUSCULAR | Status: DC | PRN
Start: 2024-02-04 — End: 2024-02-04

## 2024-02-04 MED ORDER — ZOLPIDEM TARTRATE 5 MG PO TABS: 5.0000 mg | ORAL_TABLET | Freq: Every evening | ORAL | Status: DC | PRN

## 2024-02-04 MED ORDER — LIDOCAINE HCL (PF) 1 % IJ SOLN
30.0000 mL | INTRAMUSCULAR | Status: DC | PRN
Start: 2024-02-04 — End: 2024-02-04

## 2024-02-04 MED ORDER — LACTATED RINGERS IV SOLN
500.0000 mL | Freq: Once | INTRAVENOUS | Status: AC
  Administered 2024-02-04: 500 mL via INTRAVENOUS

## 2024-02-04 MED ORDER — TRANEXAMIC ACID-NACL 1000-0.7 MG/100ML-% IV SOLN
1000.0000 mg | INTRAVENOUS | Status: AC
  Administered 2024-02-04: 1000 mg via INTRAVENOUS

## 2024-02-04 MED ORDER — OXYCODONE-ACETAMINOPHEN 5-325 MG PO TABS: 1.0000 | ORAL_TABLET | ORAL | Status: DC | PRN

## 2024-02-04 MED ORDER — OXYTOCIN-SODIUM CHLORIDE 30-0.9 UT/500ML-% IV SOLN: 1.0000 m[IU]/min | INTRAVENOUS | Status: DC

## 2024-02-04 MED ORDER — LACTATED RINGERS IV SOLN: INTRAVENOUS | Status: DC

## 2024-02-04 MED ORDER — LIDOCAINE-EPINEPHRINE (PF) 1.5 %-1:200000 IJ SOLN
INTRAMUSCULAR | Status: DC | PRN
  Administered 2024-02-04: 5 mL via EPIDURAL

## 2024-02-04 MED ORDER — OXYTOCIN BOLUS FROM INFUSION
333.0000 mL | Freq: Once | INTRAVENOUS | Status: AC
  Administered 2024-02-04: 333 mL via INTRAVENOUS

## 2024-02-04 MED ORDER — MISOPROSTOL 50MCG HALF TABLET
50.0000 ug | ORAL_TABLET | Freq: Once | ORAL | Status: AC
Start: 2024-02-04 — End: 2024-02-04
  Administered 2024-02-04: 50 ug via BUCCAL
  Filled 2024-02-04: qty 1

## 2024-02-04 MED ORDER — OXYTOCIN-SODIUM CHLORIDE 30-0.9 UT/500ML-% IV SOLN
1.0000 m[IU]/min | INTRAVENOUS | Status: DC
  Administered 2024-02-04: 2 m[IU]/min via INTRAVENOUS
  Filled 2024-02-04: qty 500

## 2024-02-04 MED ORDER — DIBUCAINE (PERIANAL) 1 % EX OINT: 1.0000 | TOPICAL_OINTMENT | CUTANEOUS | Status: DC | PRN

## 2024-02-04 MED ORDER — ACETAMINOPHEN 325 MG PO TABS: 650.0000 mg | ORAL_TABLET | ORAL | Status: DC | PRN

## 2024-02-04 MED ORDER — TRANEXAMIC ACID-NACL 1000-0.7 MG/100ML-% IV SOLN
INTRAVENOUS | Status: AC
  Filled 2024-02-04: qty 100

## 2024-02-04 NOTE — Progress Notes (Deleted)
 OBSTETRIC ADMISSION HISTORY AND PHYSICAL  Miranda Curtis is a 23 y.o. female [phone] with IUP d/t syncope episode w/ multiple fall - Normal Echo at [redacted]w[redacted]d by  presenting for US . She reports +FMs, No LOF, no VB, no blurry vision, headaches or peripheral edema, and RUQ pain.  She plans on bottle feeding. She request OCP for birth control. She received her prenatal care at [facility]   Dating: By US  --->  Estimated Date of Delivery: 02/19/24  Sono:   @[redacted]w[redacted]d , CWD, normal anatomy, Cephalic presentation, Anterior Placenta lie, 2491 g, 61% EFW   Prenatal History/Complications: Syncope  Past Medical History: 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-has resolved following surgery   Eczema    History of depression 11/18/2017   History of diabetes mellitus 04/11/2022   Maternal varicella, non-immune 05/09/2017   Missed abortion 04/07/2019   Vitamin B 12 deficiency 10/26/2017   05/09/22: b12 injection     Vitamin D  deficiency 05/09/2022    Past Surgical History: Past Surgical History:  Procedure Laterality Date   CHOLECYSTECTOMY N/A 03/13/2023   Procedure: LAPAROSCOPIC CHOLECYSTECTOMY WITH ICG DYE;  Surgeon: [name], MD;  Location: [facility];  Service: General;  Laterality: N/A;   DILATION AND EVACUATION N/A 02/24/2023   Procedure: DILATATION AND EVACUATION;  Surgeon: [name], MD;  Location: [facility];  Service: Gynecology;  Laterality: N/A;   SLEEVE GASTROPLASTY  10/12/2021   UPPER GASTROINTESTINAL ENDOSCOPY N/A     Obstetrical History: OB History     Gravida  6   Para  2   Term  2   Preterm      AB  3   Living  2      SAB  2   IAB  1   Ectopic      Multiple  0   Live Births  2           Social History Social History   Socioeconomic History   Marital status:  Single    Spouse name: Not on file   Number of children: 1   Years of education: 13   Highest education level: Not on file  Occupational History   Occupation: consulting civil engineer   Occupation: CNA  Tobacco Use   Smoking status: Never   Smokeless tobacco: Never  Vaping Use   Vaping status: Never Used  Substance and Sexual Activity   Alcohol use: No   Drug use: No   Sexual activity: Yes    Partners: Male    Birth control/protection: None    Comment: last SI 08/18/23  Other Topics Concern   Not on file  Social History Narrative   Not on file   Social Drivers of Health   Financial Resource Strain: Low Risk  (07/29/2023)   Overall Financial Resource Strain (CARDIA)    Difficulty of Paying Living Expenses: Not very hard  Food Insecurity: No Food Insecurity (02/04/2024)   Hunger Vital Sign    Worried About Running Out of Food in the Last Year: Never true    Ran Out of Food in the Last Year: Never true  Transportation Needs: No Transportation Needs (02/04/2024)   PRAPARE - Administrator, Civil Service (Medical): No    Lack of Transportation (Non-Medical): No  Physical Activity: Inactive (07/29/2023)  Exercise Vital Sign    Days of Exercise per Week: 0 days    Minutes of Exercise per Session: 0 min  Stress: Stress Concern Present (07/29/2023)   [employer] of Occupational Health - Occupational Stress Questionnaire    Feeling of Stress : To some extent  Social Connections: Socially Isolated (01/18/2024)   Social Connection and Isolation Panel    Frequency of Communication with Friends and Family: Three times a week    Frequency of Social Gatherings with Friends and Family: Twice a week    Attends Religious Services: Never    Database Administrator or Organizations: No    Attends Engineer, Structural: Never    Marital Status: Never married    Family History: Family History  Problem Relation Age of Onset   Diabetes Mother    Healthy Mother    Sickle cell  trait Mother    Sickle cell trait Father    Rashes / Skin problems Father        eczema   Asthma Sister    Rashes / Skin problems Sister        eczema   Rashes / Skin problems Sister        eczema   Rashes / Skin problems Sister        eczema   Sickle cell anemia Sister    Rashes / Skin problems Sister        eczema   Autism Brother    Healthy Brother    Autism Daughter    Diabetes Maternal Grandmother    Hypertension Maternal Grandmother    Cancer Maternal Grandfather        prostate   Cancer Paternal Grandmother 84       lung   Healthy Paternal Grandfather    Ovarian cancer Neg Hx    Colon cancer Neg Hx    Breast cancer Neg Hx     Allergies: Allergies  Allergen Reactions   Reglan  [Metoclopramide ] Anxiety and Other (See Comments)    Severe panic attack    Medications Prior to Admission  Medication Sig Dispense Refill Last Dose/Taking   aspirin  EC 81 MG tablet Take 1 tablet (81 mg total) by mouth daily. Swallow whole. (Patient not taking: Reported on 01/21/2024) 100 tablet 12    butalbital -acetaminophen -caffeine  (FIORICET ) 50-325-40 MG tablet Take 1-2 tablets by mouth every 6 (six) hours as needed for headache. (Patient not taking: Reported on 01/21/2024) 20 tablet 0    cyanocobalamin  (VITAMIN B12) 1000 MCG/ML injection Inject 1 mL (1,000 mcg total) into the muscle every other day for 30 doses. (Patient not taking: Reported on 01/21/2024) 30 mL 0    cyclobenzaprine  (FLEXERIL ) 10 MG tablet Take 1 tablet (10 mg total) by mouth 3 (three) times daily as needed for muscle spasms. 20 tablet 0    prenatal vitamin w/FE, FA (NATACHEW) 29-1 MG CHEW chewable tablet Chew 1 tablet by mouth daily at 12 noon. (Patient not taking: Reported on 12/11/2023) 30 tablet 2          NURSING  PROVIDER  Office Location [facility] Dating by U/S at 5+5 wks  [facility] Model Traditional Anatomy U/S 09/26/2023 normal  Initiated care at  10wks                 Language  English               LAB RESULTS    Support Person FOB- [facility] [employer] NIPS: LR female AFP: screen  neg      NT/IT (FT only)        Carrier Screen Horizon: sickle cell negative 03/2019  Rhogam  --/--/AB POS Performed at [facility], [address], [place], [place] [address]  [phone]) A1C/GTT Early HgbA1C: 4.5 (Normal) Third trimester 2 hr GTT: Normal  Flu Vaccine 12/10/23 (UTD)      TDaP Vaccine Offered at 30wk Offered at 32wk Pt Declined Blood Type --/--/AB POS Performed at [facility], [address] [place], [place] [address]  [phone])  RSV Vaccine Offer at 32 weeks  Antibody Negative (04/21 1442)  COVID Vaccine X 2 doses Rubella 1.25 (04/21 1442)  Feeding Plan bottle RPR Non Reactive (08/04 9176)  Contraception Considering options-- OCPs HBsAg Negative (04/21 1442)  Circumcision N/A HIV Non Reactive (08/04 9176)  Pediatrician  [facility] HCVAb  Negative  Prenatal Classes Info given      BTL Consent BTL Not desired Pap       Diagnosis  Date Value Ref Range Status  04/11/2022     Final    - Negative for intraepithelial lesion or malignancy (NILM)    BTL Pre-payment N/A GC/CT Initial:  Neg/Neg 36wks:  neg/eg  VBAC Consent N/A GBS negative For PCN allergy, check sensitivities   BRx Optimized? [ ]  yes   [x]  no      DME Rx [x]  BP cuff [ ]  Weight Scale Waterbirth  N/A  PHQ9 & GAD7 [x]  new OB [ x] 28 weeks  [  ] 36 weeks Induction  [ ]  Orders Entered [ ] Foley Y/N Plan IOL 39wk per MFM     Review of Systems   All systems reviewed and negative except as stated in HPI  Blood pressure 112/65, pulse 100, temperature 98.7 F (37.1 C), temperature source Oral, resp. rate 16, height 5' 1 (1.549 m), weight 93.4 kg, not currently breastfeeding. General appearance: alert, cooperative, and appears stated age Lungs: clear to auscultation bilaterally Heart: regular rate and rhythm Abdomen: soft, non-tender; bowel sounds normal   Presentation: cephalic Fetal monitoringBaseline: 140s bpm,  Variability: Good {> 6 bpm), Accelerations: Reactive, and Decelerations: Absent Uterine activityNone Dilation: 1.5 Effacement (%): Thick Station: -3 Exam by:: Nicholaus, RN   Prenatal labs: ABO, Rh: --/--/AB POS (10/28 9360) Antibody: NEG (10/28 0639) Rubella: 1.25 (04/21 1442) RPR: Non Reactive (08/04 0823)  HBsAg: Negative (04/21 1442)  HIV: Non Reactive (08/04 0823)  GBS: Negative/-- (10/08 1528)    Lab Results  Component Value Date   GBS Negative 01/15/2024   Anatomy US   ---------------------------------------------------------------------- Gestational Age    U/S Today:     19w 5d                                        EDD:   02/15/24  Best:          19w 1d     Det. By:  Early Ultrasound         EDD:   02/19/24                                      (06/24/23) ---------------------------------------------------------------------- No abnormal - recommending ASA, BP control at delivery at 39 wk d/t BMI  Immunization History  Administered Date(s) Administered   DTaP 11/28/1999, 01/30/2000, 06/14/2000, 12/24/2000  Dtap, Unspecified 10/14/2003   Fluzone Influenza virus vaccine,trivalent (IIV3), split virus 02/27/2007   HPV Quadrivalent 12/08/2010, 12/22/2012, 12/25/2013   Hep B, Unspecified 02-Sep-1999, 10/26/1999, 06/14/2000   Hepatitis A, Ped/Adol-2 Dose 02/27/2007, 04/29/2009   IPV 11/28/1999, 01/30/2000, 12/24/2000   Influenza Nasal 01/24/2010   Influenza, Seasonal, Injecte, Preservative Fre 04/29/2009   Influenza,Quad,Nasal, Live 12/22/2012, 12/25/2013   Influenza,inj,Quad PF,6+ Mos 02/19/2020   MMR 10/01/2000, 10/14/2003   Meningococcal Conjugate 12/08/2010   Meningococcal Mcv4,unspecified 12/08/2010   Moderna Covid-19 Vaccine Bivalent Booster 79yrs & up 01/22/2022   Moderna Sars-Covid-2 Vaccination 06/15/2019, 12/26/2019   PPD Test 05/26/2019, 06/16/2019   Pneumococcal Conjugate PCV 7 11/28/1999, 01/30/2000   Polio, Unspecified 10/14/2003   Tdap 12/08/2010,  09/30/2017   Varicella 10/01/2000, 02/27/2007    Prenatal Transfer Tool  Maternal Diabetes: No Genetic Screening: Normal Maternal Ultrasounds/Referrals: Normal Fetal Ultrasounds or other Referrals:  None Maternal Substance Abuse:  No Significant Maternal Medications:  Meds include: Other:  ASA Significant Maternal Lab Results: Group B Strep negative Number of Prenatal Visits:greater than 3 verified prenatal visits Maternal Vaccinations:TDap and Flu Other Comments:  None   Results for orders placed or performed during the hospital encounter of 02/04/24 (from the past 24 hours)  CBC   Collection Time: 02/04/24  6:30 AM  Result Value Ref Range   WBC 6.4 4.0 - 10.5 K/uL   RBC 4.81 3.87 - 5.11 MIL/uL   Hemoglobin 11.6 (L) 12.0 - 15.0 g/dL   HCT 65.2 (L) 63.9 - 53.9 %   MCV 72.1 (L) 80.0 - 100.0 fL   MCH 24.1 (L) 26.0 - 34.0 pg   MCHC 33.4 30.0 - 36.0 g/dL   RDW 71.5 (H) 88.4 - 84.4 %   Platelets 193 150 - 400 K/uL   nRBC 0.0 0.0 - 0.2 %  Type and screen   Collection Time: 02/04/24  6:39 AM  Result Value Ref Range   ABO/RH(D) AB POS    Antibody Screen NEG    Sample Expiration      02/07/2024,2359 Performed at [facility], [address], [place], [place] [address]     Patient Active Problem List   Diagnosis Date Noted   Iron  deficiency anemia during pregnancy 01/21/2024   Trichomonal vaginitis during pregnancy in third trimester 12/26/2023   Malabsorption 12/13/2023   COVID-19 affecting pregnancy in second trimester 11/11/2023   History of bariatric surgery 10/13/2023   Vasovagal syncope 10/05/2023   B12 deficiency 10/05/2023   Obesity affecting pregnancy, antepartum 09/16/2023   Short interval between pregnancies affecting pregnancy, antepartum 08/12/2023   Maternal varicella, non-immune 08/12/2023   Supervision of normal pregnancy 07/29/2023   Anxiety and depression 07/29/2023   ADHD 07/29/2023   History of gastric bypass 04/11/2022    Assessment/Plan:   Ashly Yepez is a 23 y.o. [id number] at [redacted]w[redacted]d here for IOL for syncope   #Labor: continue antepartum care per unit protocol #Pain: Per patient requested #FWB: Category 1 #GBS status:  negative #Feeding: Formula #Reproductive Life planning: OCP #Circ:  not applicable  #high BMI S/p gastric bypass, BMI 38.92 - Recommending ASA Daily at 12wks forward  #Syncope Hx of multiple fall. normal echocardiography  Likely patient's pregnancy (gravid uterus) is probably causing syncopal episodes Cardiology consulted outpatient believe her s/s d/t vasovagal/orthostatic syncope in the presence of pregnancy - Recommending delivery at 57 or 38-weeks' gestation.  - IOL - f/u OP cardiology 12 weeks PP  Houston Samuels, DO  PGY1 Family Medicine resident 02/04/2024, 10:14 AM

## 2024-02-04 NOTE — H&P (Signed)
 OBSTETRIC ADMISSION HISTORY AND PHYSICAL  Miranda Curtis is a 23 y.o. female [phone] with IUP d/t syncope episode w/ multiple fall - Normal Echo at [redacted]w[redacted]d by  presenting for US . She reports +FMs, No LOF, no VB, no blurry vision, headaches or peripheral edema, and RUQ pain.  She plans on bottle feeding. She request OCP for birth control. She received her prenatal care at [facility]   Dating: By US  --->  Estimated Date of Delivery: 02/19/24  Sono:   @[redacted]w[redacted]d , CWD, normal anatomy, Cephalic presentation, Anterior Placenta lie, 2491 g, 61% EFW   Prenatal History/Complications: Syncope  Past Medical History: 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-has resolved following surgery   Eczema    History of depression 11/18/2017   History of diabetes mellitus 04/11/2022   Maternal varicella, non-immune 05/09/2017   Missed abortion 04/07/2019   Vitamin B 12 deficiency 10/26/2017   05/09/22: b12 injection     Vitamin D  deficiency 05/09/2022    Past Surgical History: Past Surgical History:  Procedure Laterality Date   CHOLECYSTECTOMY N/A 03/13/2023   Procedure: LAPAROSCOPIC CHOLECYSTECTOMY WITH ICG DYE;  Surgeon: [name], MD;  Location: [facility] ORS;  Service: General;  Laterality: N/A;   DILATION AND EVACUATION N/A 02/24/2023   Procedure: DILATATION AND EVACUATION;  Surgeon: [name], MD;  Location: [facility] OR;  Service: Gynecology;  Laterality: N/A;   SLEEVE GASTROPLASTY  10/12/2021   UPPER GASTROINTESTINAL ENDOSCOPY N/A     Obstetrical History: OB History     Gravida  6   Para  2   Term  2   Preterm      AB  3   Living  2      SAB  2   IAB  1   Ectopic      Multiple  0   Live Births  2           Social History Social History   Socioeconomic History   Marital status:  Single    Spouse name: Not on file   Number of children: 1   Years of education: 13   Highest education level: Not on file  Occupational History   Occupation: consulting civil engineer   Occupation: CNA  Tobacco Use   Smoking status: Never   Smokeless tobacco: Never  Vaping Use   Vaping status: Never Used  Substance and Sexual Activity   Alcohol use: No   Drug use: No   Sexual activity: Yes    Partners: Male    Birth control/protection: None    Comment: last SI 08/18/23  Other Topics Concern   Not on file  Social History Narrative   Not on file   Social Drivers of Health   Financial Resource Strain: Low Risk  (07/29/2023)   Overall Financial Resource Strain (CARDIA)    Difficulty of Paying Living Expenses: Not very hard  Food Insecurity: No Food Insecurity (02/04/2024)   Hunger Vital Sign    Worried About Running Out of Food in the Last Year: Never true    Ran Out of Food in the Last Year: Never true  Transportation Needs: No Transportation Needs (02/04/2024)   PRAPARE - Administrator, Civil Service (Medical): No    Lack of Transportation (Non-Medical): No  Physical Activity: Inactive (07/29/2023)  Exercise Vital Sign    Days of Exercise per Week: 0 days    Minutes of Exercise per Session: 0 min  Stress: Stress Concern Present (07/29/2023)   [employer] of Occupational Health - Occupational Stress Questionnaire    Feeling of Stress : To some extent  Social Connections: Socially Isolated (01/18/2024)   Social Connection and Isolation Panel    Frequency of Communication with Friends and Family: Three times a week    Frequency of Social Gatherings with Friends and Family: Twice a week    Attends Religious Services: Never    Database Administrator or Organizations: No    Attends Engineer, Structural: Never    Marital Status: Never married    Family History: Family History  Problem Relation Age of Onset   Diabetes Mother    Healthy Mother    Sickle cell  trait Mother    Sickle cell trait Father    Rashes / Skin problems Father        eczema   Asthma Sister    Rashes / Skin problems Sister        eczema   Rashes / Skin problems Sister        eczema   Rashes / Skin problems Sister        eczema   Sickle cell anemia Sister    Rashes / Skin problems Sister        eczema   Autism Brother    Healthy Brother    Autism Daughter    Diabetes Maternal Grandmother    Hypertension Maternal Grandmother    Cancer Maternal Grandfather        prostate   Cancer Paternal Grandmother 30       lung   Healthy Paternal Grandfather    Ovarian cancer Neg Hx    Colon cancer Neg Hx    Breast cancer Neg Hx     Allergies: Allergies  Allergen Reactions   Reglan  [Metoclopramide ] Anxiety and Other (See Comments)    Severe panic attack    Medications Prior to Admission  Medication Sig Dispense Refill Last Dose/Taking   aspirin  EC 81 MG tablet Take 1 tablet (81 mg total) by mouth daily. Swallow whole. (Patient not taking: Reported on 01/21/2024) 100 tablet 12    butalbital -acetaminophen -caffeine  (FIORICET ) 50-325-40 MG tablet Take 1-2 tablets by mouth every 6 (six) hours as needed for headache. (Patient not taking: Reported on 01/21/2024) 20 tablet 0    cyanocobalamin  (VITAMIN B12) 1000 MCG/ML injection Inject 1 mL (1,000 mcg total) into the muscle every other day for 30 doses. (Patient not taking: Reported on 01/21/2024) 30 mL 0    cyclobenzaprine  (FLEXERIL ) 10 MG tablet Take 1 tablet (10 mg total) by mouth 3 (three) times daily as needed for muscle spasms. 20 tablet 0    prenatal vitamin w/FE, FA (NATACHEW) 29-1 MG CHEW chewable tablet Chew 1 tablet by mouth daily at 12 noon. (Patient not taking: Reported on 12/11/2023) 30 tablet 2          NURSING  PROVIDER  Office Location [facility] Dating by U/S at 5+5 wks  [facility] Model Traditional Anatomy U/S 09/26/2023 normal  Initiated care at  10wks                 Language  English               LAB RESULTS    Support Person FOB- [facility] [employer] NIPS: LR female AFP: screen  neg      NT/IT (FT only)        Carrier Screen Horizon: sickle cell negative 03/2019  Rhogam  --/--/AB POS Performed at [facility], [address], [place], [place] [address]  [phone]) A1C/GTT Early HgbA1C: 4.5 (Normal) Third trimester 2 hr GTT: Normal  Flu Vaccine 12/10/23 (UTD)      TDaP Vaccine Offered at 30wk Offered at 32wk Pt Declined Blood Type --/--/AB POS Performed at [facility], [address], [place], [place] [address]  [phone])  RSV Vaccine Offer at 32 weeks  Antibody Negative (04/21 1442)  COVID Vaccine X 2 doses Rubella 1.25 (04/21 1442)  Feeding Plan bottle RPR Non Reactive (08/04 9176)  Contraception Considering options-- OCPs HBsAg Negative (04/21 1442)  Circumcision N/A HIV Non Reactive (08/04 9176)  Pediatrician  [facility] HCVAb  Negative  Prenatal Classes Info given      BTL Consent BTL Not desired Pap       Diagnosis  Date Value Ref Range Status  04/11/2022     Final    - Negative for intraepithelial lesion or malignancy (NILM)    BTL Pre-payment N/A GC/CT Initial:  Neg/Neg 36wks:  neg/eg  VBAC Consent N/A GBS negative For PCN allergy, check sensitivities   BRx Optimized? [ ]  yes   [x]  no      DME Rx [x]  BP cuff [ ]  Weight Scale Waterbirth  N/A  PHQ9 & GAD7 [x]  new OB [ x] 28 weeks  [  ] 36 weeks Induction  [ ]  Orders Entered [ ] Foley Y/N Plan IOL 39wk per MFM     Review of Systems   All systems reviewed and negative except as stated in HPI  Blood pressure 112/65, pulse 100, temperature 98.7 F (37.1 C), temperature source Oral, resp. rate 16, height 5' 1 (1.549 m), weight 93.4 kg, not currently breastfeeding. General appearance: alert, cooperative, and appears stated age Lungs: clear to auscultation bilaterally Heart: regular rate and rhythm Abdomen: soft, non-tender; bowel sounds normal   Presentation: cephalic Fetal monitoringBaseline: 140s bpm,  Variability: Good {> 6 bpm), Accelerations: Reactive, and Decelerations: Absent Uterine activityNone  Dilation: 1.5 Effacement (%): Thick Station: -3 Exam by:: Nicholaus, RN   Prenatal labs: ABO, Rh: --/--/AB POS (10/28 9360) Antibody: NEG (10/28 0639) Rubella: 1.25 (04/21 1442) RPR: Non Reactive (08/04 0823)  HBsAg: Negative (04/21 1442)  HIV: Non Reactive (08/04 0823)  GBS: Negative/-- (10/08 1528)    Lab Results  Component Value Date   GBS Negative 01/15/2024   Anatomy US   ---------------------------------------------------------------------- Gestational Age    U/S Today:     19w 5d                                        EDD:   02/15/24  Best:          19w 1d     Det. By:  Early Ultrasound         EDD:   02/19/24                                      (06/24/23) ---------------------------------------------------------------------- No abnormal - recommending ASA, BP control at delivery at 39 wk d/t BMI  Immunization History  Administered Date(s) Administered   DTaP 11/28/1999, 01/30/2000, 06/14/2000,  12/24/2000   Dtap, Unspecified 10/14/2003   Fluzone Influenza virus vaccine,trivalent (IIV3), split virus 02/27/2007   HPV Quadrivalent 12/08/2010, 12/22/2012, 12/25/2013   Hep B, Unspecified 04-25-1999, 10/26/1999, 06/14/2000   Hepatitis A, Ped/Adol-2 Dose 02/27/2007, 04/29/2009   IPV 11/28/1999, 01/30/2000, 12/24/2000   Influenza Nasal 01/24/2010   Influenza, Seasonal, Injecte, Preservative Fre 04/29/2009   Influenza,Quad,Nasal, Live 12/22/2012, 12/25/2013   Influenza,inj,Quad PF,6+ Mos 02/19/2020   MMR 10/01/2000, 10/14/2003   Meningococcal Conjugate 12/08/2010   Meningococcal Mcv4,unspecified 12/08/2010   Moderna Covid-19 Vaccine Bivalent Booster 27yrs & up 01/22/2022   Moderna Sars-Covid-2 Vaccination 06/15/2019, 12/26/2019   PPD Test 05/26/2019, 06/16/2019   Pneumococcal Conjugate PCV 7 11/28/1999, 01/30/2000   Polio, Unspecified 10/14/2003   Tdap 12/08/2010,  09/30/2017   Varicella 10/01/2000, 02/27/2007    Prenatal Transfer Tool  Maternal Diabetes: No Genetic Screening: Normal Maternal Ultrasounds/Referrals: Normal Fetal Ultrasounds or other Referrals:  None Maternal Substance Abuse:  No Significant Maternal Medications:  Meds include: Other:  ASA Significant Maternal Lab Results: Group B Strep negative Number of Prenatal Visits:greater than 3 verified prenatal visits Maternal Vaccinations:TDap and Flu Other Comments:  None   Results for orders placed or performed during the hospital encounter of 02/04/24 (from the past 24 hours)  CBC   Collection Time: 02/04/24  6:30 AM  Result Value Ref Range   WBC 6.4 4.0 - 10.5 K/uL   RBC 4.81 3.87 - 5.11 MIL/uL   Hemoglobin 11.6 (L) 12.0 - 15.0 g/dL   HCT 65.2 (L) 63.9 - 53.9 %   MCV 72.1 (L) 80.0 - 100.0 fL   MCH 24.1 (L) 26.0 - 34.0 pg   MCHC 33.4 30.0 - 36.0 g/dL   RDW 71.5 (H) 88.4 - 84.4 %   Platelets 193 150 - 400 K/uL   nRBC 0.0 0.0 - 0.2 %  Type and screen   Collection Time: 02/04/24  6:39 AM  Result Value Ref Range   ABO/RH(D) AB POS    Antibody Screen NEG    Sample Expiration      02/07/2024,2359 Performed at [facility], [address], [place], [place] [address]     Patient Active Problem List   Diagnosis Date Noted   Iron  deficiency anemia during pregnancy 01/21/2024   Trichomonal vaginitis during pregnancy in third trimester 12/26/2023   Malabsorption 12/13/2023   COVID-19 affecting pregnancy in second trimester 11/11/2023   History of bariatric surgery 10/13/2023   Vasovagal syncope 10/05/2023   B12 deficiency 10/05/2023   Obesity affecting pregnancy, antepartum 09/16/2023   Short interval between pregnancies affecting pregnancy, antepartum 08/12/2023   Maternal varicella, non-immune 08/12/2023   Supervision of normal pregnancy 07/29/2023   Anxiety and depression 07/29/2023   ADHD 07/29/2023   History of gastric bypass 04/11/2022    Assessment/Plan:   Miranda Curtis is a 23 y.o. [id number] at [redacted]w[redacted]d here for IOL for syncope   #Labor: continue antepartum care per unit protocol #Pain: Per patient requested #FWB: Category 1 #GBS status:  negative #Feeding: Formula #Reproductive Life planning: OCP #Circ:  not applicable  #high BMI S/p gastric bypass, BMI 38.92 - Recommending ASA Daily at 12wks forward  #Syncope Hx of multiple fall. normal echocardiography  Likely patient's pregnancy (gravid uterus) is probably causing syncopal episodes Cardiology consulted outpatient believe her s/s d/t vasovagal/orthostatic syncope in the presence of pregnancy - Recommending delivery at 73 or 38-weeks' gestation.  - IOL - f/u OP cardiology 12 weeks PP  Houston Samuels, DO  PGY1 Family Medicine resident 02/04/2024,  10:14 AM  Attestation of Supervision of Student:  I confirm that I have verified the information documented in the resident's note and that I have also personally reperformed the history, physical exam and all medical decision making activities.  I have verified that all services and findings are accurately documented in this student's note; and I agree with management and plan as outlined in the documentation. I have also made any necessary editorial changes.   Barkley LITTIE Angles, MD OB Fellow 02/04/2024 12:11 PM

## 2024-02-04 NOTE — Anesthesia Preprocedure Evaluation (Signed)
 Anesthesia Evaluation  Patient identified by MRN, date of birth, ID band Patient awake    Reviewed: Allergy & Precautions, H&P , NPO status , Patient's Chart, lab work & pertinent test results  History of Anesthesia Complications Negative for: history of anesthetic complications  Airway Mallampati: II       Dental no notable dental hx.    Pulmonary neg pulmonary ROS   Pulmonary exam normal        Cardiovascular negative cardio ROS Normal cardiovascular exam     Neuro/Psych  PSYCHIATRIC DISORDERS Anxiety Depression    negative neurological ROS     GI/Hepatic negative GI ROS, Neg liver ROS,,,  Endo/Other  diabetes    Renal/GU negative Renal ROS     Musculoskeletal   Abdominal  (+) + obese  Peds  Hematology  (+) Blood dyscrasia, anemia PLT: 193   Anesthesia Other Findings   Reproductive/Obstetrics (+) Pregnancy                              Anesthesia Physical Anesthesia Plan  ASA: 2  Anesthesia Plan: Epidural   Post-op Pain Management:    Induction:   PONV Risk Score and Plan:   Airway Management Planned:   Additional Equipment:   Intra-op Plan:   Post-operative Plan:   Informed Consent: I have reviewed the patients History and Physical, chart, labs and discussed the procedure including the risks, benefits and alternatives for the proposed anesthesia with the patient or authorized representative who has indicated his/her understanding and acceptance.       Plan Discussed with:   Anesthesia Plan Comments:         Anesthesia Quick Evaluation

## 2024-02-04 NOTE — Anesthesia Procedure Notes (Signed)
 Epidural Patient location during procedure: OB Start time: 02/04/2024 3:40 PM End time: 02/04/2024 3:50 PM  Staffing Anesthesiologist: Patrisha Bernardino SQUIBB, MD Performed: anesthesiologist   Preanesthetic Checklist Completed: patient identified, IV checked, site marked, risks and benefits discussed, monitors and equipment checked, pre-op evaluation and timeout performed  Epidural Patient position: sitting Prep: DuraPrep Patient monitoring: heart rate, cardiac monitor, continuous pulse ox and blood pressure Approach: midline Location: L3-L4 Injection technique: LOR air  Needle:  Needle type: Tuohy  Needle gauge: 17 G Needle length: 9 cm Needle insertion depth: 7 cm Catheter type: closed end flexible Catheter size: 19 Gauge Catheter at skin depth: 12 cm Test dose: negative and 1.5% lidocaine  with Epi 1:200 K  Assessment Events: blood not aspirated, no cerebrospinal fluid, injection not painful, no injection resistance and negative IV test  Additional Notes Informed consent obtained prior to proceeding including risk of failure, 1% risk of PDPH, risk of minor discomfort and bruising. Discussed alternatives to epidural analgesia and patient desires to proceed.  Timeout performed pre-procedure verifying patient name, procedure, and platelet count.  Patient tolerated procedure well. Reason for block:procedure for pain

## 2024-02-04 NOTE — Discharge Summary (Signed)
 Postpartum Discharge Summary  Date of Service updated***     Patient Name: Miranda Curtis DOB: 2000-11-08 MRN: [id number]  Date of admission: 02/04/2024 Delivery date:02/04/2024 Delivering provider: MILLY PLANAS A Date of discharge: 02/04/2024  Admitting diagnosis: Vasovagal syncope [R55] Intrauterine pregnancy: [redacted]w[redacted]d     Secondary diagnosis:  Principal Problem:   Vasovagal syncope  Additional problems: none    Discharge diagnosis: Term Pregnancy Delivered                                              Post partum procedures:{Postpartum procedures:23558} Augmentation: AROM, Cytotec , and IP Foley Complications: None  Hospital course: Induction of Labor With Vaginal Delivery   23 y.o. yo [id number] at [redacted]w[redacted]d was admitted to the hospital 02/04/2024 for induction of labor.  Indication for induction: vasovagal syncope.  Patient had an uncomplicated labor course. Membrane Rupture Time/Date: 1:57 PM,02/04/2024  Delivery Method:Vaginal, Spontaneous Operative Delivery:N/A Episiotomy: None Lacerations:  None Details of delivery can be found in separate delivery note.  Patient had a postpartum course complicated by***. Patient is discharged home 02/04/24.  Newborn Data: Birth date:02/04/2024 Birth time:9:28 PM Gender:Female Living status:Living Apgars:6 ,9  Weight:3110 g  Magnesium  Sulfate received: No BMZ received: No Rhophylac:No MMR:No T-DaP:declined Flu: Yes RSV Vaccine received: No Transfusion:No  Immunizations received: Immunization History  Administered Date(s) Administered   DTaP 11/28/1999, 01/30/2000, 06/14/2000, 12/24/2000   Dtap, Unspecified 10/14/2003   Fluzone Influenza virus vaccine,trivalent (IIV3), split virus 02/27/2007   HPV Quadrivalent 12/08/2010, 12/22/2012, 12/25/2013   Hep B, Unspecified 02-02-00, 10/26/1999, 06/14/2000   Hepatitis A, Ped/Adol-2 Dose 02/27/2007, 04/29/2009   IPV 11/28/1999, 01/30/2000, 12/24/2000   Influenza Nasal  01/24/2010   Influenza, Seasonal, Injecte, Preservative Fre 04/29/2009   Influenza,Quad,Nasal, Live 12/22/2012, 12/25/2013   Influenza,inj,Quad PF,6+ Mos 02/19/2020   MMR 10/01/2000, 10/14/2003   Meningococcal Conjugate 12/08/2010   Meningococcal Mcv4,unspecified 12/08/2010   Moderna Covid-19 Vaccine Bivalent Booster 67yrs & up 01/22/2022   Moderna Sars-Covid-2 Vaccination 06/15/2019, 12/26/2019   PPD Test 05/26/2019, 06/16/2019   Pneumococcal Conjugate PCV 7 11/28/1999, 01/30/2000   Polio, Unspecified 10/14/2003   Tdap 12/08/2010, 09/30/2017   Varicella 10/01/2000, 02/27/2007    Physical exam  Vitals:   02/04/24 1918 02/04/24 1930 02/04/24 2000 02/04/24 2032  BP:  (!) 80/55 (!) 95/57 (!) 94/55  Pulse:  (!) 107 93 83  Resp:      Temp: 98.1 F (36.7 C)     TempSrc: Oral     SpO2:      Weight:      Height:       General: {Exam; general:21111117} Lochia: {Desc; appropriate/inappropriate:30686::appropriate} Uterine Fundus: {Desc; firm/soft:30687} Incision: {Exam; incision:21111123} DVT Evaluation: {Exam; icu:7888877} Labs: Lab Results  Component Value Date   WBC 6.4 02/04/2024   HGB 11.6 (L) 02/04/2024   HCT 34.7 (L) 02/04/2024   MCV 72.1 (L) 02/04/2024   PLT 193 02/04/2024      Latest Ref Rng & Units 12/25/2023    9:47 AM  CMP  Glucose 70 - 99 mg/dL 71   BUN 6 - 20 mg/dL 4   Creatinine 9.42 - 8.99 mg/dL 9.32   Sodium 865 - 855 mmol/L 136   Potassium 3.5 - 5.2 mmol/L 4.0   Chloride 96 - 106 mmol/L 104   CO2 20 - 29 mmol/L 18   Calcium 8.7 - 10.2 mg/dL 8.9  Total Protein 6.0 - 8.5 g/dL 6.5   Total Bilirubin 0.0 - 1.2 mg/dL 0.7   Alkaline Phos 41 - 116 IU/L 61   AST 0 - 40 IU/L 9   ALT 0 - 32 IU/L 7    Edinburgh Score:    11/22/2022   10:14 AM  Edinburgh Postnatal Depression Scale Screening Tool  I have been able to laugh and see the funny side of things. 0   I have looked forward with enjoyment to things. 0   I have blamed myself unnecessarily when  things went wrong. 0   I have been anxious or worried for no good reason. 0   I have felt scared or panicky for no good reason. 0   Things have been getting on top of me. 0   I have been so unhappy that I have had difficulty sleeping. 0   I have felt sad or miserable. 0   I have been so unhappy that I have been crying. 0   The thought of harming myself has occurred to me. 0   Edinburgh Postnatal Depression Scale Total 0      Data saved with a previous flowsheet row definition   No data recorded  After visit meds:  Allergies as of 02/04/2024       Reactions   Reglan  [metoclopramide ] Anxiety, Other (See Comments)   Severe panic attack     Med Rec must be completed prior to using this [facility]***        Discharge home in stable condition Infant Feeding: {Baby feeding:23562} Infant Disposition:{CHL IP OB HOME WITH FNUYZM:76418} Discharge instruction: per After Visit Summary and Postpartum booklet. Activity: Advance as tolerated. Pelvic rest for 6 weeks.  Diet: {OB ipzu:78888878} Future Appointments: Future Appointments  Date Time Provider Department Center  04/23/2024 11:20 AM Tobb, Kardie, DO [facility]   Follow up Visit:  Message sent to [facility] on 02/04/24:  Please schedule this patient for a In person postpartum visit in 6 weeks with the following provider: Any provider. Additional Postpartum F/U:n/a  Low risk pregnancy complicated by: vasovagal syncope in pregnancy Delivery mode:  Vaginal, Spontaneous Anticipated Birth Control:  OCPs   02/04/2024 Olam Boards, CNM

## 2024-02-04 NOTE — Progress Notes (Signed)
 Miranda Curtis is a 23 y.o. [id number] at [redacted]w[redacted]d by 5 week ultrasound admitted for induction of labor due to syncopal episodes, vasovagal r/t pregnancy.  Subjective: Pt comfortable with epidural, starting to feel rectal pressure.  Objective: BP (!) 95/57   Pulse 93   Temp 98.1 F (36.7 C) (Oral)   Resp 16   Ht 5' 1 (1.549 m)   Wt 93.4 kg   LMP  (Approximate) Comment: End of January  SpO2 99%   BMI 38.92 kg/m  I/O last 3 completed shifts: In: 1738.2 [I.V.:1736.6; Other:1.6] Out: -  No intake/output data recorded.  FHT:  FHR: 145 bpm, variability: moderate,  accelerations:  Abscent,  decelerations:  Present nonrepetitive lates, variables UC:   regular, every 4-5 minutes SVE:   Dilation: 8.5 Effacement (%): 90 Station: -1 Exam by:: Olam Leftwhich-Kirby CNM  Labs: Lab Results  Component Value Date   WBC 6.4 02/04/2024   HGB 11.6 (L) 02/04/2024   HCT 34.7 (L) 02/04/2024   MCV 72.1 (L) 02/04/2024   PLT 193 02/04/2024    Assessment / Plan: IOL of labor, progressing well  Labor: Progressing normally. Pt feeling more pressure so SVE by CNM. Cervix unchanged, repositioned to sidelying with peanut ball by RN.  Continue to change positions to encourage descent.   Preeclampsia:  n/a Fetal Wellbeing:  Category II Pain Control:  Epidural I/D:  GBS neg Anticipated MOD:  NSVD  Olam Boards, CNM 02/04/2024, 8:33 PM

## 2024-02-05 ENCOUNTER — Encounter (HOSPITAL_BASED_OUTPATIENT_CLINIC_OR_DEPARTMENT_OTHER): Payer: Self-pay | Admitting: Certified Nurse Midwife

## 2024-02-05 ENCOUNTER — Encounter: Payer: Self-pay | Admitting: Obstetrics & Gynecology

## 2024-02-05 MED ORDER — ACETAMINOPHEN 500 MG PO TABS
1000.0000 mg | ORAL_TABLET | Freq: Four times a day (QID) | ORAL | Status: DC
  Administered 2024-02-05 – 2024-02-06 (×6): 1000 mg via ORAL
  Filled 2024-02-05 (×6): qty 2

## 2024-02-05 NOTE — Progress Notes (Signed)
CSW acknowledged consult and completed a clinical assessment.  There are no barriers to d/c.  Clinical assessment notes will be entered at a later time.   Enos Fling, Theresia Majors Clinical Social Worker [phone]

## 2024-02-05 NOTE — Progress Notes (Signed)
 POSTPARTUM PROGRESS NOTE  Post Partum Day 1  Subjective:  Miranda Curtis is a 23 y.o. H3E6966 s/p SVD at [redacted]w[redacted]d.  She reports she is doing well. No acute events overnight. She denies any problems with ambulating, voiding or po intake. Denies nausea or vomiting.  Pain is well controlled.  Lochia is Normal.  Objective: Blood pressure 108/83, pulse 81, temperature 98.2 F (36.8 C), temperature source Oral, resp. rate 18, height 5' 1 (1.549 m), weight 93.4 kg, SpO2 99%, unknown if currently breastfeeding.  BP Readings from Last 3 Encounters:  02/05/24 108/83  02/03/24 102/66  02/02/24 115/75    Physical Exam:  General: alert, cooperative and no distress Chest: no respiratory distress Heart:regular rate, distal pulses intact Uterine Fundus: firm, appropriately tender DVT Evaluation: No calf swelling or tenderness Extremities: Trace edema Skin: warm, dry  Recent Labs    02/04/24 0630  HGB 11.6*  HCT 34.7*    Assessment/Plan: Miranda Curtis is a 23 y.o. H3E6966 s/p NSVD at [redacted]w[redacted]d   PPD# 1 - Doing well  Routine postpartum care  Delivery Complications: none Blood Pressure: normal Hb Status: Stable, appropriate postpartum Hb, no intervention indicated Contraception: OCPs to start after 6 week PPV Feeding: bottle feeding Needs Lactation Consultation: No   Dispo: Plan for discharge tomorrow.   LOS: 1 day   Charlie DELENA Courts, MD OB Fellow  02/05/2024, 11:08 AM

## 2024-02-05 NOTE — Anesthesia Postprocedure Evaluation (Signed)
 Anesthesia Post Note  Patient: Miranda Curtis  Procedure(s) Performed: AN AD HOC LABOR EPIDURAL     Patient location during evaluation: Mother Baby Anesthesia Type: Epidural Level of consciousness: awake and alert and oriented Pain management: satisfactory to patient Vital Signs Assessment: post-procedure vital signs reviewed and stable Respiratory status: respiratory function stable Cardiovascular status: stable Postop Assessment: no headache, no backache, epidural receding, patient able to bend at knees, no signs of nausea or vomiting, adequate PO intake and able to ambulate Anesthetic complications: no   No notable events documented.  Last Vitals:  Vitals:   02/05/24 0100 02/05/24 0500  BP: 104/65 104/65  Pulse: 83 69  Resp: 19 17  Temp: 36.9 C 36.8 C  SpO2: 99% 99%    Last Pain:  Vitals:   02/05/24 0515  TempSrc:   PainSc: 3    Pain Goal:                   Reyn Faivre

## 2024-02-06 MED ORDER — ACETAMINOPHEN 325 MG PO TABS: 650.0000 mg | ORAL_TABLET | ORAL | 1 refills | Status: AC | PRN

## 2024-02-06 NOTE — Clinical Social Work Maternal (Signed)
 CLINICAL SOCIAL WORK MATERNAL/CHILD NOTE  Patient Details  Name: Miranda Curtis MRN: [id number] Date of Birth: [birth year]  Date:  02/06/2024  Clinical Social Worker Initiating Note:  Rosina Molt Date/Time: Initiated:  02/06/24/0935     Child's Name:  Miranda Curtis   Biological Parents:  Mother, Father Ruta Capece 1999[birth year]08 Dorn Curtis 06-20-1998)   Need for Interpreter:  None   Reason for Referral:  Behavioral Health Concerns   Address:  [address] [place] [address]    Phone number:  [phone] (home)     Additional phone number:   Household Members/Support Persons (HM/SP):   Household Member/Support Person 1, Household Member/Support Person 2, Household Member/Support Person 3   HM/SP Name Relationship DOB or Age  HM/SP -1 Dorn Curtis FOB 06-20-1998  HM/SP -2 [name] Daughter 11-08-2017  HM/SP -3 Dorn Curtis Junior Son 10-25-2022  HM/SP -4        HM/SP -5        HM/SP -6        HM/SP -7        HM/SP -8          Natural Supports (not living in the home):  Spouse/significant other, Immediate Family   Professional Supports: None   Employment: Consulting Civil Engineer   Type of Work: Licensed Conveyancer   Education:  Attending college   Homebound arranged:    Surveyor, Quantity Resources:  Medicaid   Other Resources:  Sales Executive  , WIC   Cultural/Religious Considerations Which May Impact Care:    Strengths:  Ability to meet basic needs  , Home prepared for child  , Pediatrician chosen   Psychotropic Medications:         Pediatrician:    [facility] area  Pediatrician List:   [facility]  [place]    [place]    [facility]      Pediatrician Fax Number:    Risk Factors/Current Problems:  Mental Health Concerns     Cognitive State:  Able to Concentrate  , Alert  , Linear Thinking  , Insightful  , Goal Oriented     Mood/Affect:  Calm  , Comfortable  , Interested  , Relaxed     CSW  Assessment: CSW received a consult due to hx of anxiety,depression and ADHD. Per chart review SDOH determined housing insecurity. CSW met [name] at bedside to complete a full psychosocial assessment and offer support. CSW entered the room,introduced herself and acknowledged that FOB was present. [name] gave CSW verbal permission to speak about anything while FOB was present. CSW explained her role and the reason for the visit.  [name] presented as calm, was agreeable to consult and remained engaged throughout encounter.  CSW collected [name]'s demographic information and [name] reported CPS hx in 2020 with Lynnea Finder due to locking herself and the infant in her room; prior to this act [name] called on FOB for support due to her being sick (covid)and needing help. [name] reported while locked in the room the infant got a hold of cleaning supplies. [name] reported the infant was placed with her mom by CPS and the infant was returned just a week later and case was closed.  CSW inquired about [name]'s mental health history. [name] reported being diagnosed with Anxiety, depression and Bipolar around the ages of 57-13 years old. [name] reported being hospitalized due to her mental health around the ages of 34-54 years old at locations like  [facility], [facility], and a location in [place], [place]. [name] reported the admissions were helpful with her Bipolar Symptoms. [name] described her Bipolar as maniac  and has been prescribed medication in the past as well as participated in therapy. [name] denied any/all current supportive systems. CSW asked [name] how has she coped with her mental health over time. [name] reported coping skills which included understanding her triggers and with talking with FOB/family.  CSW provided education regarding the baby blues period vs. perinatal mood disorders, discussed treatment and gave resources for mental health follow up if concerns arise.  CSW recommends self-evaluation during the postpartum time period using the New Mom Checklist from  Postpartum Progress and encouraged [name] to contact a medical professional if symptoms are noted at any time.  CSW assessed for safety with [name] SI and HI; [name] denied all. CSW did not assess for DV; FOB was present.  CSW asked [name] does she receive support resources; [name] said yes(WIC and food stamps). [name] reported having all essential items for the infant including a carseat, bassinet and crib for safe sleeping. CSW provided review of Sudden Infant Death Syndrome (SIDS) precautions.  SDOH determined housing insecurities.  [name] reported her address on file is a stable environment for her and the infant.  CSW Plan/Description:  CSW identifies no further need for intervention and no barriers to discharge at this time.    Rosina MARLA Molt, LCSW 02/06/2024, 9:41 AM

## 2024-02-06 NOTE — Patient Instructions (Signed)

## 2024-02-07 NOTE — Addendum Note (Signed)
 Addended by: RANNIE LEITA BRAVO on: 02/07/2024 09:34 AM   Modules accepted: Orders

## 2024-02-12 ENCOUNTER — Encounter (HOSPITAL_BASED_OUTPATIENT_CLINIC_OR_DEPARTMENT_OTHER): Payer: Self-pay | Admitting: Obstetrics & Gynecology

## 2024-02-13 ENCOUNTER — Encounter (HOSPITAL_BASED_OUTPATIENT_CLINIC_OR_DEPARTMENT_OTHER): Payer: Self-pay | Admitting: Obstetrics and Gynecology

## 2024-02-19 ENCOUNTER — Encounter (HOSPITAL_BASED_OUTPATIENT_CLINIC_OR_DEPARTMENT_OTHER): Payer: Self-pay | Admitting: Certified Nurse Midwife

## 2024-02-19 ENCOUNTER — Telehealth (HOSPITAL_COMMUNITY): Payer: Self-pay

## 2024-02-19 NOTE — Telephone Encounter (Signed)
 02/19/2024 1927  Name: Miranda Curtis MRN: [id number] DOB: January 21, 2001  Reason for Call:  Transition of Care Hospital Discharge Call  Contact Status: Patient Contact Status: Complete  Language assistant needed:          Follow-Up Questions: Do You Have Any Concerns About Your Health As You Heal From Delivery?: No Do You Have Any Concerns About Your Infants Health?: No  Edinburgh Postnatal Depression Scale:  In the Past 7 Days: I have been able to laugh and see the funny side of things.: As much as I always could I have looked forward with enjoyment to things.: As much as I ever did I have blamed myself unnecessarily when things went wrong.: Not very often I have been anxious or worried for no good reason.: Hardly ever I have felt scared or panicky for no good reason.: No, not at all Things have been getting on top of me.: No, I have been coping as well as ever I have been so unhappy that I have had difficulty sleeping.: Not at all I have felt sad or miserable.: Not very often I have been so unhappy that I have been crying.: Only occasionally The thought of harming myself has occurred to me.: Never Edinburgh Postnatal Depression Scale Total: 4  PHQ2-9 Depression Scale:     Discharge Follow-up: Edinburgh score requires follow up?: No Patient was advised of the following resources:: Breastfeeding Support Group, Support Group  Post-discharge interventions: Reviewed Newborn Safe Sleep Practices  Signature  Rosaline Deretha PEAK

## 2024-02-20 ENCOUNTER — Encounter (HOSPITAL_BASED_OUTPATIENT_CLINIC_OR_DEPARTMENT_OTHER): Payer: Self-pay | Admitting: Obstetrics and Gynecology

## 2024-03-17 ENCOUNTER — Ambulatory Visit (HOSPITAL_BASED_OUTPATIENT_CLINIC_OR_DEPARTMENT_OTHER): Payer: MEDICAID | Admitting: Obstetrics & Gynecology

## 2024-03-19 ENCOUNTER — Ambulatory Visit (HOSPITAL_BASED_OUTPATIENT_CLINIC_OR_DEPARTMENT_OTHER): Payer: MEDICAID | Admitting: Obstetrics and Gynecology

## 2024-03-23 ENCOUNTER — Ambulatory Visit (HOSPITAL_BASED_OUTPATIENT_CLINIC_OR_DEPARTMENT_OTHER): Payer: MEDICAID | Admitting: Certified Nurse Midwife

## 2024-04-23 ENCOUNTER — Telehealth: Payer: Self-pay

## 2024-04-23 ENCOUNTER — Ambulatory Visit: Payer: MEDICAID | Admitting: Cardiology

## 2024-04-23 NOTE — Telephone Encounter (Signed)
 PT WAS NO SHOW TO VIRTUAL VISIT. CALLED PT SEVERAL TIMES FOR VIRTUAL APPT. AND NO ANSWER LM ON VM TO PREP FOR VISIT AND LAST MSG WAS TO CALL BACK TO RESCHEDULE HER VIRTUAL APPT.

## 2024-05-14 ENCOUNTER — Ambulatory Visit
Admission: EM | Admit: 2024-05-14 | Discharge: 2024-05-14 | Disposition: A | Discharge status: Home / Self Care | Payer: MEDICAID | Source: Home / Self Care

## 2024-05-14 DIAGNOSIS — R112 Nausea with vomiting, unspecified: Secondary | ICD-10-CM | POA: Diagnosis not present

## 2024-05-14 DIAGNOSIS — R197 Diarrhea, unspecified: Secondary | ICD-10-CM

## 2024-05-14 DIAGNOSIS — A084 Viral intestinal infection, unspecified: Secondary | ICD-10-CM

## 2024-05-14 LAB — POCT URINE DIPSTICK
Bilirubin, UA: NEGATIVE
Glucose, UA: NEGATIVE mg/dL
Nitrite, UA: NEGATIVE
Spec Grav, UA: >=1.03 — AB
Urobilinogen, UA: 1 U/dL
pH, UA: 5.5

## 2024-05-14 LAB — POCT URINE PREGNANCY: Preg Test, Ur: NEGATIVE

## 2024-05-14 MED ORDER — ONDANSETRON 8 MG PO TBDP: 8.0000 mg | ORAL_TABLET | Freq: Three times a day (TID) | ORAL | 0 refills | Status: AC | PRN

## 2024-05-14 MED ORDER — LOPERAMIDE HCL 2 MG PO CAPS: 2.0000 mg | ORAL_CAPSULE | Freq: Two times a day (BID) | ORAL | 0 refills | Status: AC | PRN

## 2024-05-14 NOTE — Discharge Instructions (Signed)

## 2024-05-14 NOTE — ED Triage Notes (Signed)
 Pt reports headache, nausea, vomiting, diarrhea and low abdominal pain x 3-4 days.

## 2024-05-14 NOTE — ED Provider Notes (Signed)
 " [facility]  Note:  This document was prepared using Conservation officer, historic buildings and may include unintentional dictation errors.  MRN: [id number] DOB: 04/22/2000  Subjective:   Fran Neiswonger is a 24 y.o. female presenting for 3-4 day history of nausea and vomiting, diarrhea, intermittent mild headaches, lower abdominal pain. No fever, hematemesis, bloody stools, respiratory symptoms, recent antibiotic use, hospitalizations or long distance travel.  Has not eaten raw foods, drank unfiltered water.  No history of GI disorders including Crohn's, IBS, ulcerative colitis.   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-has resolved following surgery   Eczema    History of depression 11/18/2017   History of diabetes mellitus 04/11/2022   Maternal varicella, non-immune 05/09/2017   Missed abortion 04/07/2019   Vitamin B 12 deficiency 10/26/2017   05/09/22: b12 injection     Vitamin D  deficiency 05/09/2022     Past Surgical History:  Procedure Laterality Date   CHOLECYSTECTOMY N/A 03/13/2023   Procedure: LAPAROSCOPIC CHOLECYSTECTOMY WITH ICG DYE;  Surgeon: [name], MD;  Location: [facility];  Service: General;  Laterality: N/A;   DILATION AND EVACUATION N/A 02/24/2023   Procedure: DILATATION AND EVACUATION;  Surgeon: [name], MD;  Location: [facility] OR;  Service: Gynecology;  Laterality: N/A;   SLEEVE GASTROPLASTY  10/12/2021   UPPER GASTROINTESTINAL ENDOSCOPY N/A     Family History  Problem Relation Age of Onset   Diabetes Mother    Healthy  Mother    Sickle cell trait Mother    Sickle cell trait Father    Rashes / Skin problems Father        eczema   Asthma Sister    Rashes / Skin problems Sister        eczema   Rashes / Skin problems Sister        eczema   Rashes / Skin problems Sister        eczema   Sickle cell anemia Sister    Rashes / Skin problems Sister        eczema   Autism Brother    Healthy Brother    Autism Daughter    Diabetes Maternal Grandmother    Hypertension Maternal Grandmother    Cancer Maternal Grandfather        prostate   Cancer Paternal Grandmother 70       lung   Healthy Paternal Grandfather    Ovarian cancer Neg Hx    Colon cancer Neg Hx    Breast cancer Neg Hx     Social History   Occupational History   Occupation: consulting civil engineer   Occupation: CNA  Tobacco Use   Smoking status: Never   Smokeless tobacco: Never  Vaping Use   Vaping status: Never Used  Substance and Sexual Activity   Alcohol use: Yes    Comment: Occa   Drug use: Never   Sexual activity: Yes    Partners: Male    Birth control/protection: None     ROS   Objective:   Vitals: BP 123/84 (BP Location: Left  Arm)   Pulse 84   Temp 99.1 F (37.3 C) (Oral)   Resp 16   LMP 04/16/2024 (Exact Date)   SpO2 96%   Breastfeeding No   Physical Exam Constitutional:      General: She is not in acute distress.    Appearance: Normal appearance. She is well-developed. She is not ill-appearing, toxic-appearing or diaphoretic.  HENT:     Head: Normocephalic and atraumatic.     Nose: Nose normal.     Mouth/Throat:     Mouth: Mucous membranes are moist.  Eyes:     General: No scleral icterus.       Right eye: No discharge.        Left eye: No discharge.     Extraocular Movements: Extraocular movements intact.     Conjunctiva/sclera: Conjunctivae normal.  Cardiovascular:     Rate and Rhythm: Normal rate.  Pulmonary:     Effort: Pulmonary effort is normal.  Abdominal:     General: Bowel sounds are increased. There  is no distension.     Palpations: Abdomen is soft. There is no mass.     Tenderness: There is no abdominal tenderness. There is no right CVA tenderness, left CVA tenderness, guarding or rebound.  Skin:    General: Skin is warm and dry.  Neurological:     General: No focal deficit present.     Mental Status: She is alert and oriented to person, place, and time.  Psychiatric:        Mood and Affect: Mood normal.        Behavior: Behavior normal.        Thought Content: Thought content normal.        Judgment: Judgment normal.    Results for orders placed or performed during the hospital encounter of 05/14/24 (from the past 24 hours)  POCT URINE DIPSTICK     Status: Abnormal   Collection Time: 05/14/24  4:42 PM  Result Value Ref Range   Color, UA yellow yellow   Clarity, UA cloudy (A) clear   Glucose, UA negative negative mg/dL   Bilirubin, UA negative negative   Ketones, POC UA trace (5) (A) negative mg/dL   Spec Grav, UA >=8.969 (A) 1.010 - 1.025   Blood, UA trace-intact (A) negative   pH, UA 5.5 5.0 - 8.0   POC PROTEIN,UA trace negative, trace   Urobilinogen, UA 1.0 0.2 or 1.0 E.U./dL   Nitrite, UA Negative Negative   Leukocytes, UA Small (1+) (A) Negative  POCT urine pregnancy     Status: None   Collection Time: 05/14/24  4:42 PM  Result Value Ref Range   Preg Test, Ur Negative Negative   Assessment and Plan :   PDMP not reviewed this encounter.  1. Viral gastroenteritis   2. Nausea vomiting and diarrhea      No signs of an acute abdomen. Will manage for viral gastroenteritis with supportive care.  Rx for Zofran  for nausea and vomiting, Imodium  for diarrhea.  Patient is to push fluids and eat light meals including soups and soft foods. Counseled patient on potential for adverse effects with medications prescribed/recommended today, ER and return-to-clinic precautions discussed, patient verbalized understanding.     [1]  Allergies Allergen Reactions   Reglan   [Metoclopramide ] Anxiety and Other (See Comments)    Severe panic attack     Christopher Savannah, PA-C 05/14/24 1653  "

## 2024-06-04 ENCOUNTER — Ambulatory Visit (HOSPITAL_BASED_OUTPATIENT_CLINIC_OR_DEPARTMENT_OTHER): Payer: MEDICAID
